# Patient Record
Sex: Female | Born: 1960 | Race: White | Hispanic: No | Marital: Married | Smoking: Current every day smoker
Health system: Southern US, Community
[De-identification: ages and names within clinical notes are randomized; demographics above are authoritative.]

## PROBLEM LIST (undated history)

## (undated) DIAGNOSIS — Z8739 Personal history of other diseases of the musculoskeletal system and connective tissue: Secondary | ICD-10-CM

## (undated) DIAGNOSIS — E0590 Thyrotoxicosis, unspecified without thyrotoxic crisis or storm: Secondary | ICD-10-CM

## (undated) DIAGNOSIS — I10 Essential (primary) hypertension: Secondary | ICD-10-CM

## (undated) DIAGNOSIS — M549 Dorsalgia, unspecified: Secondary | ICD-10-CM

## (undated) DIAGNOSIS — C541 Malignant neoplasm of endometrium: Secondary | ICD-10-CM

## (undated) DIAGNOSIS — G8929 Other chronic pain: Secondary | ICD-10-CM

## (undated) DIAGNOSIS — G43909 Migraine, unspecified, not intractable, without status migrainosus: Secondary | ICD-10-CM

## (undated) DIAGNOSIS — K529 Noninfective gastroenteritis and colitis, unspecified: Secondary | ICD-10-CM

## (undated) DIAGNOSIS — E7800 Pure hypercholesterolemia, unspecified: Secondary | ICD-10-CM

## (undated) DIAGNOSIS — F4310 Post-traumatic stress disorder, unspecified: Secondary | ICD-10-CM

## (undated) DIAGNOSIS — M4800 Spinal stenosis, site unspecified: Secondary | ICD-10-CM

## (undated) HISTORY — DX: Pure hypercholesterolemia, unspecified: E78.00

## (undated) HISTORY — DX: Other chronic pain: G89.29

## (undated) HISTORY — DX: Personal history of other diseases of the musculoskeletal system and connective tissue: Z87.39

## (undated) HISTORY — DX: Essential (primary) hypertension: I10

## (undated) HISTORY — PX: GANGLION CYST EXCISION: SHX1691

## (undated) HISTORY — DX: Noninfective gastroenteritis and colitis, unspecified: K52.9

## (undated) HISTORY — DX: Malignant neoplasm of endometrium: C54.1

## (undated) HISTORY — PX: TONSILLECTOMY: SUR1361

## (undated) HISTORY — DX: Dorsalgia, unspecified: M54.9

## (undated) HISTORY — DX: Thyrotoxicosis, unspecified without thyrotoxic crisis or storm: E05.90

## (undated) HISTORY — DX: Post-traumatic stress disorder, unspecified: F43.10

## (undated) HISTORY — PX: ABDOMINAL HYSTERECTOMY: SHX81

## (undated) HISTORY — DX: Migraine, unspecified, not intractable, without status migrainosus: G43.909

## (undated) HISTORY — PX: CHOLECYSTECTOMY: SHX55

## (undated) HISTORY — DX: Spinal stenosis, site unspecified: M48.00

---

## 1998-04-04 ENCOUNTER — Ambulatory Visit (HOSPITAL_COMMUNITY): Admission: RE | Admit: 1998-04-04 | Discharge: 1998-04-04 | Payer: Self-pay

## 2000-02-20 ENCOUNTER — Ambulatory Visit (HOSPITAL_BASED_OUTPATIENT_CLINIC_OR_DEPARTMENT_OTHER): Admission: RE | Admit: 2000-02-20 | Discharge: 2000-02-20 | Payer: Self-pay | Admitting: Orthopedic Surgery

## 2000-09-13 ENCOUNTER — Encounter: Payer: Self-pay | Admitting: Family Medicine

## 2000-09-13 ENCOUNTER — Encounter: Admission: RE | Admit: 2000-09-13 | Discharge: 2000-09-13 | Payer: Self-pay | Admitting: Family Medicine

## 2000-09-13 ENCOUNTER — Encounter (INDEPENDENT_AMBULATORY_CARE_PROVIDER_SITE_OTHER): Payer: Self-pay

## 2000-09-13 ENCOUNTER — Other Ambulatory Visit: Admission: RE | Admit: 2000-09-13 | Discharge: 2000-09-13 | Payer: Self-pay | Admitting: Radiology

## 2001-01-07 ENCOUNTER — Encounter: Payer: Self-pay | Admitting: Family Medicine

## 2001-01-07 ENCOUNTER — Ambulatory Visit (HOSPITAL_COMMUNITY): Admission: RE | Admit: 2001-01-07 | Discharge: 2001-01-07 | Payer: Self-pay | Admitting: Family Medicine

## 2002-09-23 ENCOUNTER — Ambulatory Visit (HOSPITAL_COMMUNITY): Admission: RE | Admit: 2002-09-23 | Discharge: 2002-09-23 | Payer: Self-pay | Admitting: Family Medicine

## 2002-09-23 ENCOUNTER — Encounter: Payer: Self-pay | Admitting: Family Medicine

## 2003-09-22 ENCOUNTER — Other Ambulatory Visit: Admission: RE | Admit: 2003-09-22 | Discharge: 2003-09-22 | Payer: Self-pay | Admitting: Family Medicine

## 2004-04-24 ENCOUNTER — Emergency Department (HOSPITAL_COMMUNITY): Admission: EM | Admit: 2004-04-24 | Discharge: 2004-04-24 | Payer: Self-pay | Admitting: *Deleted

## 2004-09-26 ENCOUNTER — Other Ambulatory Visit: Admission: RE | Admit: 2004-09-26 | Discharge: 2004-09-26 | Payer: Self-pay | Admitting: Family Medicine

## 2005-12-11 ENCOUNTER — Other Ambulatory Visit: Admission: RE | Admit: 2005-12-11 | Discharge: 2005-12-11 | Payer: Self-pay | Admitting: Family Medicine

## 2006-10-22 ENCOUNTER — Encounter: Admission: RE | Admit: 2006-10-22 | Discharge: 2006-10-22 | Payer: Self-pay | Admitting: Gastroenterology

## 2006-12-17 ENCOUNTER — Other Ambulatory Visit: Admission: RE | Admit: 2006-12-17 | Discharge: 2006-12-17 | Payer: Self-pay | Admitting: Family Medicine

## 2007-02-14 ENCOUNTER — Encounter (INDEPENDENT_AMBULATORY_CARE_PROVIDER_SITE_OTHER): Payer: Self-pay | Admitting: General Surgery

## 2007-02-14 ENCOUNTER — Ambulatory Visit (HOSPITAL_COMMUNITY): Admission: RE | Admit: 2007-02-14 | Discharge: 2007-02-15 | Payer: Self-pay | Admitting: General Surgery

## 2008-01-01 ENCOUNTER — Other Ambulatory Visit: Admission: RE | Admit: 2008-01-01 | Discharge: 2008-01-01 | Payer: Self-pay | Admitting: Family Medicine

## 2009-06-25 DIAGNOSIS — C541 Malignant neoplasm of endometrium: Secondary | ICD-10-CM

## 2009-06-25 HISTORY — DX: Malignant neoplasm of endometrium: C54.1

## 2009-08-19 HISTORY — PX: ROBOTIC ASSISTED LAP VAGINAL HYSTERECTOMY: SHX2362

## 2010-11-07 NOTE — Op Note (Signed)
NAMEJONTE, WOLLAM            ACCOUNT NO.:  [id number]   MEDICAL RECORD NO.:  [id number]          PATIENT TYPE:  AMB   LOCATION:  SDS                          FACILITY:  [facility]   PHYSICIAN:  Adolph Pollack, M.D.DATE OF BIRTH:  Oct 30, 1960   DATE OF PROCEDURE:  02/14/2007  DATE OF DISCHARGE:                               OPERATIVE REPORT   PREOP DIAGNOSIS:  Porcelain gallbladder.   POSTOPERATIVE DIAGNOSIS:  Porcelain gallbladder.   PROCEDURE:  Laparoscopic cholecystectomy with intraoperative  cholangiogram.   SURGEON:  Adolph Pollack, M.D.   ANESTHESIA:  General.   INDICATIONS:  Ms. Pokorney is a 50 year old female who has been having  some problems with upper abdominal pain; and during her workup she had  an ultrasound that demonstrated findings consistent with a calcified  gallbladder/porcelain gallbladder.  It is for this reason, that she  presents now for cholecystectomy.  Procedure and risks were explained to  her preoperatively. She also understands that the reason that we are  doing this is because of it being a porcelain gallbladder; and she still  may have some her symptoms.   TECHNIQUE:  She is brought to the operating room, placed supine on the  operating table, and a general anesthetic was administered.  Her  abdominal wall was sterilely prepped and draped.  Marcaine solution was  infiltrated in the supraumbilical region.  A transverse supraumbilical  incision was made through the skin and subcutaneous tissue.  Then the  fascia was identified, and an incision made in the midline fascia.  Peritoneal cavity was entered under direct vision.  A pursestring suture  of #0 Vicryl was placed around the fascial edges.  A Hassan trocar was  introduced into the peritoneal cavity and a pneumoperitoneum created by  insufflation of CO2 gas.   Next the laparoscope was introduced.  She was placed in the reverse  Trendelenburg position; and the right side tilted up.   An 11-mm trocar  was placed through an epigastric incision; and  two 5 mm trocars were  placed in the right upper quadrant.  The fundus of the gallbladder was  grasped and retracted toward the right shoulder.  Using blunt  dissection, the infundibulum was identified and grasped and then  mobilized.  The cystic duct was then identified and a window created  around it.  A clip was placed just above the cystic duct gallbladder  junction and a small incision made at the cystic duct gallbladder  junction.  A cholangiocath was passed through the anterior abdominal  wall and then placed into the cystic duct; and the cholangiogram was  performed.   Under real time fluoroscopy dilute contrast was injected into the cystic  duct which was of moderate length.  The common hepatic, right-and-left  hepatic, and common bile ducts all filled promptly, and contrast drained  promptly into the duodenum.  There was no evidence of obstruction and  the final report is pending radiologist interpretation.   The cholangiocatheter was removed, the cystic duct was clipped 3x  proximally and divided.  The cystic artery was identified and a window  created around  it.  It was clipped and divided.  The gallbladder then  dissected free from the liver using electrocautery and placed in an  Endopouch bag.   The gallbladder fossa was irrigated and bleeding points were controlled  with electrocautery.  The area was further inspected and no bleeding or  bile leak was noted.   The perihepatic irrigation fluid was evacuated.  The gallbladder was  removed through the supraumbilical port in the Endopouch bag; and then  the supraumbilical fascial defect was closed under laparoscopic vision  by tightening up and tying down the pursestring suture.  The remaining  trocars were removed; and a pneumoperitoneum was released.  The skin  incisions were closed with 4-0 Monocryl subcuticular stitches followed  by Steri-Strips and  sterile dressings.   She tolerated the procedure well without any apparent complications; and  was taken to the recovery room in satisfactory condition.      Adolph Pollack, M.D.  Electronically Signed     TJR/MEDQ  D:  02/14/2007  T:  02/15/2007  Job:  [id number]   cc:   Anselmo Rod, M.D.  Holley Bouche, M.D.

## 2010-11-10 NOTE — Op Note (Signed)
[place]  Patient:    Alejandra Taylor, Alejandra Taylor                     MRN: [id number] Proc. Date: 02/20/00 Adm. Date:  [id number] Attending:  Drema Pry                           Operative Report  PREOPERATIVE DIAGNOSIS:  Probable rupture of recently repaired extensor tendon to index at left wrist.  POSTOPERATIVE DIAGNOSIS:  Scarring of repaired laceration of extensor to index and extensor indicis proprius.  Scar down with extensor lag.  PROCEDURE:  Exploration left dorsal wrist with tenolysis of extensor indicis proprius and extensor communis at wrist.  SURGEON:  Jearld Adjutant, M.D.  ASSISTANT:  Vilinda Blanks. Moye, P.A.-C.  ANESTHESIA:  General endotracheal.  CULTURES:  None.  DRAINS:  None.  ESTIMATED BLOOD LOSS:  Minimal.  TOURNIQUET TIME:  45 minutes.  PATHOLOGIC FINDINGS/HISTORY: Alejandra Taylor is a 51 year old female who underwent removal of a ganglion cyst of the left wrist by Dr. Chaney Malling on 11/24/99. Following surgery, she was unable to fully extend her left index finger.  She was recommended to [place] by a family member who was a patient.  She had no pain associated with her condition but a costal lack of extension.  Dr. Chaney Malling had told he had partially cut the tendon and had repaired it.  She had been going to rehabilitation with no gains in extension secondary to rehabilitation.  We evaluated her, and she had extensor lag of the index finger of about 45 degrees, unable to extend the MP.  She was able to extend the DIP as well as partially extend the PIP.  Otherwise, the exam was normal. MRI scan was obtained and it showed that there was a destruction of the left index finger extensor tendon at the level of the proximal aspect of the capitate.  In any case what we found at surgery was intense scarring of both the communis and indicis proprius tendon in one mass down to the dorsal wrist capsule and into the retinaculum.  We released the  retinaculum of the fourth dorsal compartment and found that after trimming on the tendon a bit longitudinally we were able to get the scar off and that the indicis proprius was healed to the index extensor communis, and there was a gap between the proximal end of the indicis proprius but that was scarred down to the tendon and the gap was about 3 cm.  However, the whole thing had healed into one common tendon mass and at rest the resting tension was normal in wrist flexion.  The tension on the extensor tendon was equal to the others as well as wrist extension.  In addition, proximal to the level of the retinaculum when we used an Allis to tighten the tendon as it was after tenolysis it pulled the extensor tendon up appropriately into full extension.  Therefore we did not elect to do more than an extensive tenolysis and leave on the top and bottom of the tendon down to the capsule releasing it from that as well as leaving the extensor retinaculum and will start her on early motion.  Other tendons adjacent to it, 3, 4 and 5 were noted to be pulling through normally.  DESCRIPTION OF PROCEDURE:  With adequate anesthesia obtained using endotracheal technique, 1 g of Ancef was given IV prophylaxis.  The patient was placed  in the supine position.  The left upper extremity was prepped from the fingertips to the tourniquet in the standard fashion.  After standard prepping and draping, Esmarch exsanguination was used, and the tourniquet was let up to 300 mmHg.  I then made an extensor approach to the transverse incision, the radial limb distal and the ulnar limb proximal, developed flaps and placed corner stitches in the flaps to retract.  Dissection was carried down through the subcutaneous tissue in the matted scar around the extensor retinaculum.  Meticulous dissection was then carried out around the extensor tendon to four.  The dorsal compartment and the retinaculum and the extensor tendon to  four was released longitudinally as well as its attachments down to the scarred bed from whence the ganglion came and the area of the capitate. There was no disruption of the tendon.  I think the signal was coming from irregularity of the tendon as it was healing. We then tested it tension and pull through and elected not to excise any scar or repair the indicis in any other way than its position to the extensor communis tendon.  The tendon was fully free at the closure.  Irrigation was carried out, and the wound was closed in layers with 3-0 Vicryl in the subcu and running 4-0 Monocryl with Steri-Strips.  A pressure dressing was applied with a volar plaster in slight cock-up allowing full finger flexion and extension.  The patient then was awakened and taken to the recovery room in satisfactory condition for routine postoperative care, discharged and told to call the office for an appointment for physical therapy on the finger, occupational therapy on Thursday.  She was given Tylox for pain DD:  02/20/00 TD:  02/21/00 Job: [id number]

## 2011-04-06 LAB — DIFFERENTIAL
Basophils Absolute: 0
Basophils Absolute: 0
Basophils Relative: 0
Basophils Relative: 0
Eosinophils Absolute: 0
Eosinophils Absolute: 0.2
Eosinophils Relative: 0
Eosinophils Relative: 2
Lymphocytes Relative: 12
Lymphocytes Relative: 21
Lymphs Abs: 1.5
Lymphs Abs: 1.8
Monocytes Absolute: 0.6
Monocytes Absolute: 0.8 — ABNORMAL HIGH
Monocytes Relative: 7
Monocytes Relative: 7
Neutro Abs: 10.3 — ABNORMAL HIGH
Neutro Abs: 6.2
Neutrophils Relative %: 71
Neutrophils Relative %: 81 — ABNORMAL HIGH

## 2011-04-06 LAB — COMPREHENSIVE METABOLIC PANEL
ALT: 23
AST: 24
Albumin: 4.2
Alkaline Phosphatase: 46
BUN: 9
CO2: 29
Calcium: 9.7
Chloride: 99
Creatinine, Ser: 0.61
GFR calc non Af Amer: >60
Glucose, Bld: 144 — ABNORMAL HIGH
Potassium: 3.8
Sodium: 137
Total Bilirubin: 0.6
Total Protein: 7.5

## 2011-04-06 LAB — CBC
HCT: 35.3 — ABNORMAL LOW
HCT: 39.1
Hemoglobin: 12.3
Hemoglobin: 13.8
MCHC: 34.9
MCHC: 35.2
MCV: 91.5
MCV: 92.5
Platelets: 347
Platelets: 402 — ABNORMAL HIGH
RBC: 3.82 — ABNORMAL LOW
RBC: 4.27
RDW: 13
RDW: 13.4
WBC: 12.6 — ABNORMAL HIGH
WBC: 8.8

## 2011-04-06 LAB — HEPATIC FUNCTION PANEL
ALT: 52 — ABNORMAL HIGH
AST: 53 — ABNORMAL HIGH
Albumin: 3.4 — ABNORMAL LOW
Alkaline Phosphatase: 39
Bilirubin, Direct: <0.1
Total Bilirubin: 0.7
Total Protein: 6

## 2011-04-06 LAB — PROTIME-INR
INR: 0.9
Prothrombin Time: 12.4

## 2011-10-17 ENCOUNTER — Other Ambulatory Visit (HOSPITAL_COMMUNITY)
Admission: RE | Admit: 2011-10-17 | Discharge: 2011-10-17 | Disposition: A | Discharge status: Home / Self Care | Payer: BC Managed Care – PPO | Source: Ambulatory Visit | Attending: Obstetrics and Gynecology | Admitting: Obstetrics and Gynecology

## 2011-10-17 ENCOUNTER — Other Ambulatory Visit: Payer: Self-pay | Admitting: Obstetrics and Gynecology

## 2011-10-17 DIAGNOSIS — Z01419 Encounter for gynecological examination (general) (routine) without abnormal findings: Secondary | ICD-10-CM | POA: Insufficient documentation

## 2011-11-06 ENCOUNTER — Encounter: Payer: Self-pay | Admitting: Gynecologic Oncology

## 2011-11-07 ENCOUNTER — Ambulatory Visit: Payer: Self-pay | Admitting: Gynecologic Oncology

## 2011-12-03 ENCOUNTER — Ambulatory Visit: Payer: Self-pay | Admitting: Gynecology

## 2011-12-11 ENCOUNTER — Ambulatory Visit: Payer: BC Managed Care – PPO | Admitting: Gynecologic Oncology

## 2012-01-22 ENCOUNTER — Ambulatory Visit: Payer: BC Managed Care – PPO | Attending: Gynecologic Oncology | Admitting: Gynecologic Oncology

## 2012-01-22 ENCOUNTER — Encounter: Payer: Self-pay | Admitting: Gynecologic Oncology

## 2012-01-22 VITALS — BP 122/80 | HR 78 | Temp 98.7°F | Resp 16 | Ht 67.32 in | Wt 211.5 lb

## 2012-01-22 DIAGNOSIS — Z803 Family history of malignant neoplasm of breast: Secondary | ICD-10-CM | POA: Insufficient documentation

## 2012-01-22 DIAGNOSIS — Z79899 Other long term (current) drug therapy: Secondary | ICD-10-CM | POA: Insufficient documentation

## 2012-01-22 DIAGNOSIS — I10 Essential (primary) hypertension: Secondary | ICD-10-CM | POA: Insufficient documentation

## 2012-01-22 DIAGNOSIS — E7800 Pure hypercholesterolemia, unspecified: Secondary | ICD-10-CM | POA: Insufficient documentation

## 2012-01-22 DIAGNOSIS — F17200 Nicotine dependence, unspecified, uncomplicated: Secondary | ICD-10-CM | POA: Insufficient documentation

## 2012-01-22 DIAGNOSIS — C541 Malignant neoplasm of endometrium: Secondary | ICD-10-CM | POA: Insufficient documentation

## 2012-01-22 DIAGNOSIS — E119 Type 2 diabetes mellitus without complications: Secondary | ICD-10-CM | POA: Insufficient documentation

## 2012-01-22 DIAGNOSIS — C549 Malignant neoplasm of corpus uteri, unspecified: Secondary | ICD-10-CM | POA: Insufficient documentation

## 2012-01-22 DIAGNOSIS — Z90710 Acquired absence of both cervix and uterus: Secondary | ICD-10-CM | POA: Insufficient documentation

## 2012-01-22 DIAGNOSIS — Z8041 Family history of malignant neoplasm of ovary: Secondary | ICD-10-CM | POA: Insufficient documentation
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
## 2012-01-22 NOTE — Patient Instructions (Addendum)
Advised to stop smoking. Genetic counseling referral. F/U in 1 year.  Thank you very much Alejandra Taylor for allowing me to provide care for you today.  I appreciate your confidence in choosing our Gynecologic Oncology team.  If you have any questions about your visit today please call our office and we will get back to you as soon as possible.  [name]. Tyller Bowlby MD., PhD Gynecologic Oncology

## 2012-03-17 ENCOUNTER — Other Ambulatory Visit: Payer: BC Managed Care – PPO | Admitting: Lab

## 2012-03-17 ENCOUNTER — Ambulatory Visit: Payer: BC Managed Care – PPO | Admitting: Genetic Counselor

## 2012-03-17 NOTE — Progress Notes (Signed)
Patient did not show for her appointment.

## 2013-04-27 DIAGNOSIS — F4001 Agoraphobia with panic disorder: Secondary | ICD-10-CM | POA: Diagnosis not present

## 2013-04-27 DIAGNOSIS — F4310 Post-traumatic stress disorder, unspecified: Secondary | ICD-10-CM | POA: Diagnosis not present

## 2013-04-27 DIAGNOSIS — F330 Major depressive disorder, recurrent, mild: Secondary | ICD-10-CM | POA: Diagnosis not present

## 2013-05-06 DIAGNOSIS — M25819 Other specified joint disorders, unspecified shoulder: Secondary | ICD-10-CM | POA: Diagnosis not present

## 2013-05-20 DIAGNOSIS — I10 Essential (primary) hypertension: Secondary | ICD-10-CM | POA: Diagnosis not present

## 2013-05-20 DIAGNOSIS — F4310 Post-traumatic stress disorder, unspecified: Secondary | ICD-10-CM | POA: Diagnosis not present

## 2013-05-20 DIAGNOSIS — E119 Type 2 diabetes mellitus without complications: Secondary | ICD-10-CM | POA: Diagnosis not present

## 2013-05-20 DIAGNOSIS — Z23 Encounter for immunization: Secondary | ICD-10-CM | POA: Diagnosis not present

## 2013-05-20 DIAGNOSIS — M545 Low back pain, unspecified: Secondary | ICD-10-CM | POA: Diagnosis not present

## 2013-05-20 DIAGNOSIS — E039 Hypothyroidism, unspecified: Secondary | ICD-10-CM | POA: Diagnosis not present

## 2013-05-20 DIAGNOSIS — E785 Hyperlipidemia, unspecified: Secondary | ICD-10-CM | POA: Diagnosis not present

## 2013-09-11 DIAGNOSIS — E119 Type 2 diabetes mellitus without complications: Secondary | ICD-10-CM | POA: Diagnosis not present

## 2013-10-14 ENCOUNTER — Telehealth: Payer: Self-pay | Admitting: *Deleted

## 2013-10-14 NOTE — Telephone Encounter (Signed)
Called pt to remind her of future appt on 10/15/13, pt stated "I would like to cancel this appointment my  husband is having surgery tomorrow". Pt refused to reschedule at this time. Pt stated "I will call back to reschedule at a later time"

## 2013-10-15 ENCOUNTER — Ambulatory Visit: Payer: BC Managed Care – PPO | Admitting: Gynecologic Oncology

## 2013-10-26 DIAGNOSIS — F4001 Agoraphobia with panic disorder: Secondary | ICD-10-CM | POA: Diagnosis not present

## 2013-10-26 DIAGNOSIS — F3342 Major depressive disorder, recurrent, in full remission: Secondary | ICD-10-CM | POA: Diagnosis not present

## 2014-04-28 DIAGNOSIS — F3342 Major depressive disorder, recurrent, in full remission: Secondary | ICD-10-CM | POA: Diagnosis not present

## 2014-04-28 DIAGNOSIS — F4310 Post-traumatic stress disorder, unspecified: Secondary | ICD-10-CM | POA: Diagnosis not present

## 2014-04-28 DIAGNOSIS — F4001 Agoraphobia with panic disorder: Secondary | ICD-10-CM | POA: Diagnosis not present

## 2014-11-01 DIAGNOSIS — J40 Bronchitis, not specified as acute or chronic: Secondary | ICD-10-CM | POA: Diagnosis not present

## 2014-11-01 DIAGNOSIS — L820 Inflamed seborrheic keratosis: Secondary | ICD-10-CM | POA: Diagnosis not present

## 2014-11-01 DIAGNOSIS — M545 Low back pain: Secondary | ICD-10-CM | POA: Diagnosis not present

## 2014-11-01 DIAGNOSIS — E1165 Type 2 diabetes mellitus with hyperglycemia: Secondary | ICD-10-CM | POA: Diagnosis not present

## 2014-11-01 DIAGNOSIS — E039 Hypothyroidism, unspecified: Secondary | ICD-10-CM | POA: Diagnosis not present

## 2014-11-01 DIAGNOSIS — G441 Vascular headache, not elsewhere classified: Secondary | ICD-10-CM | POA: Diagnosis not present

## 2014-11-01 DIAGNOSIS — E785 Hyperlipidemia, unspecified: Secondary | ICD-10-CM | POA: Diagnosis not present

## 2014-11-01 DIAGNOSIS — Z23 Encounter for immunization: Secondary | ICD-10-CM | POA: Diagnosis not present

## 2014-11-01 DIAGNOSIS — M1990 Unspecified osteoarthritis, unspecified site: Secondary | ICD-10-CM | POA: Diagnosis not present

## 2015-03-02 ENCOUNTER — Telehealth: Payer: Self-pay | Admitting: Nurse Practitioner

## 2015-03-02 NOTE — Telephone Encounter (Signed)
Patient called clinic to reschedule apt 03/03/15. She is in [place] for sick family member and unsure when she will return. Rn gave her next available with Dr. Skeet Latch, 04/07/15 at 2:45, but informed her that if she returns sooner and wants to be seen by a colleague to call clinic. Also informed her to call clinic with any new needs or concerns. She verbalizes understanding.

## 2015-03-03 ENCOUNTER — Ambulatory Visit: Payer: Self-pay | Admitting: Gynecologic Oncology

## 2015-04-07 ENCOUNTER — Other Ambulatory Visit (HOSPITAL_COMMUNITY)
Admission: RE | Admit: 2015-04-07 | Discharge: 2015-04-07 | Disposition: A | Discharge status: Home / Self Care | Payer: BLUE CROSS/BLUE SHIELD | Source: Ambulatory Visit | Attending: Gynecologic Oncology | Admitting: Gynecologic Oncology

## 2015-04-07 ENCOUNTER — Encounter: Payer: Self-pay | Admitting: Gynecologic Oncology

## 2015-04-07 ENCOUNTER — Ambulatory Visit: Payer: BLUE CROSS/BLUE SHIELD | Attending: Gynecologic Oncology | Admitting: Gynecologic Oncology

## 2015-04-07 VITALS — BP 169/76 | HR 84 | Temp 98.2°F | Resp 18 | Ht 67.32 in | Wt 207.3 lb

## 2015-04-07 DIAGNOSIS — N9489 Other specified conditions associated with female genital organs and menstrual cycle: Secondary | ICD-10-CM | POA: Diagnosis not present

## 2015-04-07 DIAGNOSIS — Z808 Family history of malignant neoplasm of other organs or systems: Secondary | ICD-10-CM | POA: Diagnosis not present

## 2015-04-07 DIAGNOSIS — Z720 Tobacco use: Secondary | ICD-10-CM | POA: Diagnosis not present

## 2015-04-07 DIAGNOSIS — C541 Malignant neoplasm of endometrium: Secondary | ICD-10-CM | POA: Insufficient documentation

## 2015-04-07 DIAGNOSIS — Z01411 Encounter for gynecological examination (general) (routine) with abnormal findings: Secondary | ICD-10-CM | POA: Insufficient documentation

## 2015-04-07 DIAGNOSIS — N898 Other specified noninflammatory disorders of vagina: Secondary | ICD-10-CM

## 2015-04-07 NOTE — Patient Instructions (Signed)
We will call you with the results of your pap smear and biopsy taken today. Follow-up with Dr. Skeet Latch in 1 year (October 2017). Please call our office in the summer 2017 to schedule this appointment or sooner with any concerns.
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
## 2015-04-11 ENCOUNTER — Telehealth: Payer: Self-pay | Admitting: Gynecologic Oncology

## 2015-04-11 NOTE — Telephone Encounter (Signed)
Patient informed of biopsy results.  No concerns voiced.  Advised to call for any questions or concerns. 

## 2015-04-12 ENCOUNTER — Other Ambulatory Visit: Payer: Self-pay | Admitting: *Deleted

## 2015-04-12 DIAGNOSIS — C541 Malignant neoplasm of endometrium: Secondary | ICD-10-CM

## 2015-04-12 LAB — CYTOLOGY - PAP

## 2015-04-13 ENCOUNTER — Telehealth: Payer: Self-pay

## 2015-04-13 NOTE — Telephone Encounter (Signed)
Orders received from [place] to contact the patient and  update with result from 04/07/15 with Dr Skeet Latch  .Patient contacted with results from PAP on 04/07/15 being "normal" . Patient states understanding , denies further concerns at this time , will call with any changes.

## 2015-04-14 ENCOUNTER — Telehealth: Payer: Self-pay

## 2015-04-14 NOTE — Telephone Encounter (Signed)
Follow up call placed to Genetic Counseling to see if the patient was scheduled for genetic counseling as requested by Dr Janie Morning. No answer , left a detailed message with request for genetic counseling.  Call back information given if additional questions arise.

## 2015-04-19 ENCOUNTER — Telehealth: Payer: Self-pay | Admitting: Genetic Counselor

## 2015-04-19 NOTE — Telephone Encounter (Signed)
Genetic appt-s/w patient and gave genetic appt for 11/15 @ 2 w/Kayla Boggs

## 2015-05-10 ENCOUNTER — Other Ambulatory Visit: Payer: Medicare Other

## 2015-05-10 ENCOUNTER — Encounter: Payer: Medicare Other | Admitting: Genetic Counselor

## 2015-07-04 LAB — CBC AND DIFFERENTIAL
HCT: 41 (ref 36–46)
Hemoglobin: 14.3 (ref 12.0–16.0)
Platelets: 383 (ref 150–399)
WBC: 8.3

## 2015-07-04 LAB — TSH: TSH: 2.11 (ref <=5.90)

## 2015-07-04 LAB — BASIC METABOLIC PANEL
BUN: 7 (ref 4–21)
Creatinine: 0.7 (ref <=1.1)
Glucose: 173
Potassium: 3.4 (ref 3.4–5.3)
Sodium: 137 (ref 137–147)

## 2015-07-04 LAB — LIPID PANEL
Cholesterol: 216 — AB (ref 0–200)
HDL: 39 (ref 35–70)
LDL Cholesterol: 125
Triglycerides: 263 — AB (ref 40–160)

## 2015-07-04 LAB — HEPATIC FUNCTION PANEL
ALT: 212 — AB (ref 7–35)
AST: 117 — AB (ref 13–35)
Alkaline Phosphatase: 110 (ref 25–125)
Bilirubin, Total: 0.7

## 2015-07-14 DIAGNOSIS — L309 Dermatitis, unspecified: Secondary | ICD-10-CM | POA: Diagnosis not present

## 2015-07-14 DIAGNOSIS — E1165 Type 2 diabetes mellitus with hyperglycemia: Secondary | ICD-10-CM | POA: Diagnosis not present

## 2015-07-14 DIAGNOSIS — E785 Hyperlipidemia, unspecified: Secondary | ICD-10-CM | POA: Diagnosis not present

## 2015-07-14 DIAGNOSIS — F4310 Post-traumatic stress disorder, unspecified: Secondary | ICD-10-CM | POA: Diagnosis not present

## 2015-07-14 DIAGNOSIS — E039 Hypothyroidism, unspecified: Secondary | ICD-10-CM | POA: Diagnosis not present

## 2015-07-14 DIAGNOSIS — I10 Essential (primary) hypertension: Secondary | ICD-10-CM | POA: Diagnosis not present

## 2015-07-14 DIAGNOSIS — Z23 Encounter for immunization: Secondary | ICD-10-CM | POA: Diagnosis not present

## 2015-07-14 DIAGNOSIS — N3000 Acute cystitis without hematuria: Secondary | ICD-10-CM | POA: Diagnosis not present

## 2015-07-14 DIAGNOSIS — M545 Low back pain: Secondary | ICD-10-CM | POA: Diagnosis not present

## 2015-07-28 DIAGNOSIS — I10 Essential (primary) hypertension: Secondary | ICD-10-CM | POA: Diagnosis not present

## 2015-08-30 DIAGNOSIS — F3342 Major depressive disorder, recurrent, in full remission: Secondary | ICD-10-CM | POA: Diagnosis not present

## 2015-08-30 DIAGNOSIS — F4310 Post-traumatic stress disorder, unspecified: Secondary | ICD-10-CM | POA: Diagnosis not present

## 2015-08-30 DIAGNOSIS — F4001 Agoraphobia with panic disorder: Secondary | ICD-10-CM | POA: Diagnosis not present

## 2015-11-25 DIAGNOSIS — E119 Type 2 diabetes mellitus without complications: Secondary | ICD-10-CM | POA: Diagnosis not present

## 2016-04-20 LAB — HEMOGLOBIN A1C: Hemoglobin A1C: 5.2

## 2016-04-23 LAB — HM MAMMOGRAPHY

## 2016-05-30 DIAGNOSIS — M545 Low back pain: Secondary | ICD-10-CM | POA: Diagnosis not present

## 2016-05-30 DIAGNOSIS — M5416 Radiculopathy, lumbar region: Secondary | ICD-10-CM | POA: Diagnosis not present

## 2016-05-30 DIAGNOSIS — M79672 Pain in left foot: Secondary | ICD-10-CM | POA: Diagnosis not present

## 2016-07-18 DIAGNOSIS — M1711 Unilateral primary osteoarthritis, right knee: Secondary | ICD-10-CM | POA: Diagnosis not present

## 2016-07-18 DIAGNOSIS — M1712 Unilateral primary osteoarthritis, left knee: Secondary | ICD-10-CM | POA: Diagnosis not present

## 2016-11-21 DIAGNOSIS — F4001 Agoraphobia with panic disorder: Secondary | ICD-10-CM | POA: Diagnosis not present

## 2016-11-21 DIAGNOSIS — F3342 Major depressive disorder, recurrent, in full remission: Secondary | ICD-10-CM | POA: Diagnosis not present

## 2016-11-21 DIAGNOSIS — F4310 Post-traumatic stress disorder, unspecified: Secondary | ICD-10-CM | POA: Diagnosis not present

## 2017-01-28 LAB — LIPID PANEL
Cholesterol: 174 (ref 0–200)
HDL: 80 — AB (ref 35–70)
LDL Cholesterol: 71
Triglycerides: 115 (ref 40–160)

## 2017-01-28 LAB — CBC AND DIFFERENTIAL
HCT: 39 (ref 36–46)
Hemoglobin: 13.9 (ref 12.0–16.0)
Platelets: 261 (ref 150–399)
WBC: 9.9

## 2017-01-28 LAB — BASIC METABOLIC PANEL
BUN: 10 (ref 4–21)
Creatinine: 1 (ref <=1.1)
Glucose: 110
Potassium: 4.4 (ref 3.4–5.3)
Sodium: 119 — AB (ref 137–147)

## 2017-01-28 LAB — TSH: TSH: 0.73 (ref <=5.90)

## 2017-01-28 LAB — HEPATIC FUNCTION PANEL
ALT: 8 (ref 7–35)
AST: 15 (ref 13–35)
Alkaline Phosphatase: 70 (ref 25–125)
Bilirubin, Total: 0.3

## 2017-01-30 ENCOUNTER — Ambulatory Visit
Admission: RE | Admit: 2017-01-30 | Discharge: 2017-01-30 | Disposition: A | Discharge status: Home / Self Care | Payer: BLUE CROSS/BLUE SHIELD | Source: Ambulatory Visit | Attending: Family Medicine | Admitting: Family Medicine

## 2017-01-30 ENCOUNTER — Other Ambulatory Visit: Payer: Self-pay | Admitting: Family Medicine

## 2017-01-30 DIAGNOSIS — R059 Cough, unspecified: Secondary | ICD-10-CM

## 2017-01-30 DIAGNOSIS — R05 Cough: Secondary | ICD-10-CM

## 2017-03-20 ENCOUNTER — Ambulatory Visit: Payer: BLUE CROSS/BLUE SHIELD | Admitting: Family Medicine

## 2017-03-21 ENCOUNTER — Ambulatory Visit: Payer: BLUE CROSS/BLUE SHIELD | Admitting: Family Medicine

## 2017-04-17 ENCOUNTER — Encounter: Payer: Self-pay | Admitting: Adult Health

## 2017-04-17 ENCOUNTER — Ambulatory Visit (INDEPENDENT_AMBULATORY_CARE_PROVIDER_SITE_OTHER): Payer: BLUE CROSS/BLUE SHIELD | Admitting: Adult Health

## 2017-04-17 VITALS — BP 131/84 | HR 106 | Ht 67.25 in | Wt 194.8 lb

## 2017-04-17 DIAGNOSIS — E785 Hyperlipidemia, unspecified: Secondary | ICD-10-CM | POA: Diagnosis not present

## 2017-04-17 DIAGNOSIS — F4310 Post-traumatic stress disorder, unspecified: Secondary | ICD-10-CM | POA: Diagnosis not present

## 2017-04-17 DIAGNOSIS — Z23 Encounter for immunization: Secondary | ICD-10-CM

## 2017-04-17 DIAGNOSIS — G8929 Other chronic pain: Secondary | ICD-10-CM | POA: Insufficient documentation

## 2017-04-17 DIAGNOSIS — I10 Essential (primary) hypertension: Secondary | ICD-10-CM

## 2017-04-17 DIAGNOSIS — I152 Hypertension secondary to endocrine disorders: Secondary | ICD-10-CM | POA: Insufficient documentation

## 2017-04-17 DIAGNOSIS — G43901 Migraine, unspecified, not intractable, with status migrainosus: Secondary | ICD-10-CM | POA: Insufficient documentation

## 2017-04-17 DIAGNOSIS — E039 Hypothyroidism, unspecified: Secondary | ICD-10-CM

## 2017-04-17 DIAGNOSIS — E1169 Type 2 diabetes mellitus with other specified complication: Secondary | ICD-10-CM | POA: Insufficient documentation

## 2017-04-17 MED ORDER — METFORMIN HCL 500 MG PO TABS: 500.0000 mg | ORAL_TABLET | Freq: Two times a day (BID) | ORAL | 0 refills | Status: DC

## 2017-04-17 MED ORDER — LEVOTHYROXINE SODIUM 125 MCG PO TABS: 125.0000 ug | ORAL_TABLET | Freq: Every day | ORAL | 0 refills | Status: DC

## 2017-04-17 MED ORDER — SIMVASTATIN 40 MG PO TABS: 40.0000 mg | ORAL_TABLET | Freq: Every evening | ORAL | 0 refills | Status: DC

## 2017-04-17 MED ORDER — TOPIRAMATE 100 MG PO TABS: 200.0000 mg | ORAL_TABLET | Freq: Every day | ORAL | 0 refills | Status: DC

## 2017-04-17 MED ORDER — EZETIMIBE 10 MG PO TABS: 10.0000 mg | ORAL_TABLET | Freq: Every day | ORAL | 0 refills | Status: DC

## 2017-04-17 MED ORDER — LISINOPRIL-HYDROCHLOROTHIAZIDE 20-12.5 MG PO TABS: 1.0000 | ORAL_TABLET | Freq: Two times a day (BID) | ORAL | 0 refills | Status: DC

## 2017-04-17 MED ORDER — PANTOPRAZOLE SODIUM 40 MG PO TBEC: 40.0000 mg | DELAYED_RELEASE_TABLET | Freq: Every day | ORAL | 0 refills | Status: DC

## 2017-04-17 NOTE — Assessment & Plan Note (Signed)
Topiramate 100mg  - 2 tabs QHS-refill sent in. Neurology referral placed.

## 2017-04-17 NOTE — Patient Instructions (Signed)
Heart-Healthy Eating Plan Many factors influence your heart health, including eating and exercise habits. Heart (coronary) risk increases with abnormal blood fat (lipid) levels. Heart-healthy meal planning includes limiting unhealthy fats, increasing healthy fats, and making other small dietary changes. This includes maintaining a healthy body weight to help keep lipid levels within a normal range. What is my plan? Your health care provider recommends that you:  Get no more than __25__% of the total calories in your daily diet from fat.  Limit your intake of saturated fat to less than __5___% of your total calories each day.  Limit the amount of cholesterol in your diet to less than __300__ mg per day.  What types of fat should I choose?  Choose healthy fats more often. Choose monounsaturated and polyunsaturated fats, such as olive oil and canola oil, flaxseeds, walnuts, almonds, and seeds.  Eat more omega-3 fats. Good choices include salmon, mackerel, sardines, tuna, flaxseed oil, and ground flaxseeds. Aim to eat fish at least two times each week.  Limit saturated fats. Saturated fats are primarily found in animal products, such as meats, butter, and cream. Plant sources of saturated fats include palm oil, palm kernel oil, and coconut oil.  Avoid foods with partially hydrogenated oils in them. These contain trans fats. Examples of foods that contain trans fats are stick margarine, some tub margarines, cookies, crackers, and other baked goods. What general guidelines do I need to follow?  Check food labels carefully to identify foods with trans fats or high amounts of saturated fat.  Fill one half of your plate with vegetables and green salads. Eat 4-5 servings of vegetables per day. A serving of vegetables equals 1 cup of raw leafy vegetables,  cup of raw or cooked cut-up vegetables, or  cup of vegetable juice.  Fill one fourth of your plate with whole grains. Look for the word "whole"  as the first word in the ingredient list.  Fill one fourth of your plate with lean protein foods.  Eat 4-5 servings of fruit per day. A serving of fruit equals one medium whole fruit,  cup of dried fruit,  cup of fresh, frozen, or canned fruit, or  cup of 100% fruit juice.  Eat more foods that contain soluble fiber. Examples of foods that contain this type of fiber are apples, broccoli, carrots, beans, peas, and barley. Aim to get 20-30 g of fiber per day.  Eat more home-cooked food and less restaurant, buffet, and fast food.  Limit or avoid alcohol.  Limit foods that are high in starch and sugar.  Avoid fried foods.  Cook foods by using methods other than frying. Baking, boiling, grilling, and broiling are all great options. Other fat-reducing suggestions include: ? Removing the skin from poultry. ? Removing all visible fats from meats. ? Skimming the fat off of stews, soups, and gravies before serving them. ? Steaming vegetables in water or broth.  Lose weight if you are overweight. Losing just 5-10% of your initial body weight can help your overall health and prevent diseases such as diabetes and heart disease.  Increase your consumption of nuts, legumes, and seeds to 4-5 servings per week. One serving of dried beans or legumes equals  cup after being cooked, one serving of nuts equals 1 ounces, and one serving of seeds equals  ounce or 1 tablespoon.  You may need to monitor your salt (sodium) intake, especially if you have high blood pressure. Talk with your health care provider or dietitian to get  more information about reducing sodium. What foods can I eat? Grains  Breads, including [place], white, pita, wheat, raisin, rye, oatmeal, and [place]. Tortillas that are neither fried nor made with lard or trans fat. Low-fat rolls, including hotdog and hamburger buns and English muffins. Biscuits. Muffins. Waffles. Pancakes. Light popcorn. Whole-grain cereals. Flatbread. Melba  toast. Pretzels. Breadsticks. Rusks. Low-fat snacks and crackers, including oyster, saltine, matzo, graham, animal, and rye. Rice and pasta, including brown rice and those that are made with whole wheat. Vegetables All vegetables. Fruits All fruits, but limit coconut. Meats and Other Protein Sources Lean, well-trimmed beef, veal, pork, and lamb. Chicken and [place] without skin. All fish and shellfish. Wild duck, rabbit, pheasant, and venison. Egg whites or low-cholesterol egg substitutes. Dried beans, peas, lentils, and tofu.Seeds and most nuts. Dairy Low-fat or nonfat cheeses, including ricotta, string, and mozzarella. Skim or 1% milk that is liquid, powdered, or evaporated. Buttermilk that is made with low-fat milk. Nonfat or low-fat yogurt. Beverages Mineral water. Diet carbonated beverages. Sweets and Desserts Sherbets and fruit ices. Honey, jam, marmalade, jelly, and syrups. Meringues and gelatins. Pure sugar candy, such as hard candy, jelly beans, gumdrops, mints, marshmallows, and small amounts of dark chocolate. [employer] Eat all sweets and desserts in moderation. Fats and Oils Nonhydrogenated (trans-free) margarines. Vegetable oils, including soybean, sesame, sunflower, olive, peanut, safflower, corn, canola, and cottonseed. Salad dressings or mayonnaise that are made with a vegetable oil. Limit added fats and oils that you use for cooking, baking, salads, and as spreads. Other Cocoa powder. Coffee and tea. All seasonings and condiments. The items listed above may not be a complete list of recommended foods or beverages. Contact your dietitian for more options. What foods are not recommended? Grains Breads that are made with saturated or trans fats, oils, or whole milk. Croissants. Butter rolls. Cheese breads. Sweet rolls. Donuts. Buttered popcorn. Chow mein noodles. High-fat crackers, such as cheese or butter crackers. Meats and Other Protein Sources Fatty meats, such as  hotdogs, short ribs, sausage, spareribs, bacon, ribeye roast or steak, and mutton. High-fat deli meats, such as salami and bologna. Caviar. Domestic duck and goose. Organ meats, such as kidney, liver, sweetbreads, brains, gizzard, chitterlings, and heart. Dairy Cream, sour cream, cream cheese, and creamed cottage cheese. Whole milk cheeses, including blue (bleu), Monterey Jack, [place], [place], American, [place], Swiss, [place], [place], and [place]. Whole or 2% milk that is liquid, evaporated, or condensed. Whole buttermilk. Cream sauce or high-fat cheese sauce. Yogurt that is made from whole milk. Beverages Regular sodas and drinks with added sugar. Sweets and Desserts Frosting. Pudding. Cookies. Cakes other than angel food cake. Candy that has milk chocolate or white chocolate, hydrogenated fat, butter, coconut, or unknown ingredients. Buttered syrups. Full-fat ice cream or ice cream drinks. Fats and Oils Gravy that has suet, meat fat, or shortening. Cocoa butter, hydrogenated oils, palm oil, coconut oil, palm kernel oil. These can often be found in baked products, candy, fried foods, nondairy creamers, and whipped toppings. Solid fats and shortenings, including bacon fat, salt pork, lard, and butter. Nondairy cream substitutes, such as coffee creamers and sour cream substitutes. Salad dressings that are made of unknown oils, cheese, or sour cream. The items listed above may not be a complete list of foods and beverages to avoid. Contact your dietitian for more information. This information is not intended to replace advice given to you by your health care provider. Make sure you discuss any questions you have with your health care  provider. Document Released: 03/20/2008 Document Revised: 12/30/2015 Document Reviewed: 12/03/2013 Elsevier Interactive Patient Education  2017 [employer].   Hypertension Hypertension, commonly called high blood pressure, is when the force of blood pumping through the  arteries is too strong. The arteries are the blood vessels that carry blood from the heart throughout the body. Hypertension forces the heart to work harder to pump blood and may cause arteries to become narrow or stiff. Having untreated or uncontrolled hypertension can cause heart attacks, strokes, kidney disease, and other problems. A blood pressure reading consists of a higher number over a lower number. Ideally, your blood pressure should be below 120/80. The first ("top") number is called the systolic pressure. It is a measure of the pressure in your arteries as your heart beats. The second ("bottom") number is called the diastolic pressure. It is a measure of the pressure in your arteries as the heart relaxes. What are the causes? The cause of this condition is not known. What increases the risk? Some risk factors for high blood pressure are under your control. Others are not. Factors you can change  Smoking.  Having type 2 diabetes mellitus, high cholesterol, or both.  Not getting enough exercise or physical activity.  Being overweight.  Having too much fat, sugar, calories, or salt (sodium) in your diet.  Drinking too much alcohol. Factors that are difficult or impossible to change  Having chronic kidney disease.  Having a family history of high blood pressure.  Age. Risk increases with age.  Race. You may be at higher risk if you are African-American.  Gender. Men are at higher risk than women before age 63. After age 80, women are at higher risk than men.  Having obstructive sleep apnea.  Stress. What are the signs or symptoms? Extremely high blood pressure (hypertensive crisis) may cause:  Headache.  Anxiety.  Shortness of breath.  Nosebleed.  Nausea and vomiting.  Severe chest pain.  Jerky movements you cannot control (seizures).  How is this diagnosed? This condition is diagnosed by measuring your blood pressure while you are seated, with your arm  resting on a surface. The cuff of the blood pressure monitor will be placed directly against the skin of your upper arm at the level of your heart. It should be measured at least twice using the same arm. Certain conditions can cause a difference in blood pressure between your right and left arms. Certain factors can cause blood pressure readings to be lower or higher than normal (elevated) for a short period of time:  When your blood pressure is higher when you are in a health care provider's office than when you are at home, this is called white coat hypertension. Most people with this condition do not need medicines.  When your blood pressure is higher at home than when you are in a health care provider's office, this is called masked hypertension. Most people with this condition may need medicines to control blood pressure.  If you have a high blood pressure reading during one visit or you have normal blood pressure with other risk factors:  You may be asked to return on a different day to have your blood pressure checked again.  You may be asked to monitor your blood pressure at home for 1 week or longer.  If you are diagnosed with hypertension, you may have other blood or imaging tests to help your health care provider understand your overall risk for other conditions. How is this treated? This  condition is treated by making healthy lifestyle changes, such as eating healthy foods, exercising more, and reducing your alcohol intake. Your health care provider may prescribe medicine if lifestyle changes are not enough to get your blood pressure under control, and if:  Your systolic blood pressure is above 130.  Your diastolic blood pressure is above 80.  Your personal target blood pressure may vary depending on your medical conditions, your age, and other factors. Follow these instructions at home: Eating and drinking  Eat a diet that is high in fiber and potassium, and low in sodium,  added sugar, and fat. An example eating plan is called the DASH (Dietary Approaches to Stop Hypertension) diet. To eat this way: ? Eat plenty of fresh fruits and vegetables. Try to fill half of your plate at each meal with fruits and vegetables. ? Eat whole grains, such as whole wheat pasta, brown rice, or whole grain bread. Fill about one quarter of your plate with whole grains. ? Eat or drink low-fat dairy products, such as skim milk or low-fat yogurt. ? Avoid fatty cuts of meat, processed or cured meats, and poultry with skin. Fill about one quarter of your plate with lean proteins, such as fish, chicken without skin, beans, eggs, and tofu. ? Avoid premade and processed foods. These tend to be higher in sodium, added sugar, and fat.  Reduce your daily sodium intake. Most people with hypertension should eat less than 1,500 mg of sodium a day.  Limit alcohol intake to no more than 1 drink a day for nonpregnant women and 2 drinks a day for men. One drink equals 12 oz of beer, 5 oz of wine, or 1 oz of hard liquor. Lifestyle  Work with your health care provider to maintain a healthy body weight or to lose weight. Ask what an ideal weight is for you.  Get at least 30 minutes of exercise that causes your heart to beat faster (aerobic exercise) most days of the week. Activities may include walking, swimming, or biking.  Include exercise to strengthen your muscles (resistance exercise), such as pilates or lifting weights, as part of your weekly exercise routine. Try to do these types of exercises for 30 minutes at least 3 days a week.  Do not use any products that contain nicotine or tobacco, such as cigarettes and e-cigarettes. If you need help quitting, ask your health care provider.  Monitor your blood pressure at home as told by your health care provider.  Keep all follow-up visits as told by your health care provider. This is important. Medicines  Take over-the-counter and prescription  medicines only as told by your health care provider. Follow directions carefully. Blood pressure medicines must be taken as prescribed.  Do not skip doses of blood pressure medicine. Doing this puts you at risk for problems and can make the medicine less effective.  Ask your health care provider about side effects or reactions to medicines that you should watch for. Contact a health care provider if:  You think you are having a reaction to a medicine you are taking.  You have headaches that keep coming back (recurring).  You feel dizzy.  You have swelling in your ankles.  You have trouble with your vision. Get help right away if:  You develop a severe headache or confusion.  You have unusual weakness or numbness.  You feel faint.  You have severe pain in your chest or abdomen.  You vomit repeatedly.  You have trouble breathing.  Summary  Hypertension is when the force of blood pumping through your arteries is too strong. If this condition is not controlled, it may put you at risk for serious complications.  Your personal target blood pressure may vary depending on your medical conditions, your age, and other factors. For most people, a normal blood pressure is less than 120/80.  Hypertension is treated with lifestyle changes, medicines, or a combination of both. Lifestyle changes include weight loss, eating a healthy, low-sodium diet, exercising more, and limiting alcohol. This information is not intended to replace advice given to you by your health care provider. Make sure you discuss any questions you have with your health care provider. Document Released: 06/11/2005 Document Revised: 05/09/2016 Document Reviewed: 05/09/2016 Elsevier Interactive Patient Education  2018 [employer].  Increase water intake, strive for at least 100 ounces/day.   Follow Heart Healthy diet Increase regular exercise.  Recommend at least 30 minutes daily, 5 days per week of walking, jogging,  biking, swimming, YouTube/Pinterest workout videos. Please continue all medications as directed. Please continue with Orthopedic and Psychiatrist as directed. Please schedule a follow-up in 3 months, sooner if needed. WELCOME TO THE PRACTICE!

## 2017-04-17 NOTE — Assessment & Plan Note (Signed)
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  She has been using OTC Acetaminophen Q4H for pain and has appt with Pain Clinic tomorrow.

## 2017-04-17 NOTE — Assessment & Plan Note (Signed)
Currently taking: Venlafaxine 150mg  daily Quetiapine 300mg  daily Clonazepam 1mg  TID  She receives these medications from her psychiatrist, which she see's every 6 months and PRN.
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
## 2017-04-17 NOTE — Assessment & Plan Note (Signed)
Last lipid panel 2018 Currently taking Ezetimibe 10mg  and Simvastatin 40mg  daily

## 2017-04-17 NOTE — Assessment & Plan Note (Signed)
BP at goal 131/84, with slight tachycardia 106 Continue Lisinopril/HCTZ 20/12.5mg  Reduce-stop tobacco use

## 2017-04-17 NOTE — Assessment & Plan Note (Signed)
Last TSH 01/25/2017- 0.727 Currently on Levothyroxine 167mcg daily

## 2017-04-22 ENCOUNTER — Telehealth: Payer: Self-pay | Admitting: Adult Health

## 2017-04-22 NOTE — Telephone Encounter (Signed)
Patient called states while she was in for her initial new pt visit she was diagnosed w/ Elevated Sodium levels by prior PCP but not advised on what to do, says several different providers called with several different results from prior PCP office.   --- pt also states is under care of an Orthopedic provider who has here on meloxicam & cyclobenzaprine (just wanted provider to know) she has had allergic reactions to anti-inflammatories,  I advised her to contact the prescribing provider to let them know (since Dr. Jenetta Downer did not prescribe either.  Pt request a call back.  --glh

## 2017-04-23 NOTE — Telephone Encounter (Signed)
Pt states that [facility] had previously told her that her sodium was LOW (not elevated) and that she needed to drink less water.  Advised pt that once we receive records from their office, Valetta Fuller will review them and we will let her know if she has any further instructions.  Pt expressed understanding and is agreeable.  Charyl Bigger, CMA

## 2017-04-23 NOTE — Telephone Encounter (Signed)
LVM for pt to return call.  T. Nelson, CMA 

## 2017-05-06 ENCOUNTER — Other Ambulatory Visit: Payer: Self-pay | Admitting: Adult Health

## 2017-05-06 DIAGNOSIS — E871 Hypo-osmolality and hyponatremia: Secondary | ICD-10-CM

## 2017-05-06 NOTE — Telephone Encounter (Signed)
Good Afternoon Tonya, I have not seen any OV notes from [facility], however was able to review the abstracted labs from 01/2017.  Her last Na++ was low- 119 (normal 137-147). Please call her and ask her to schedule a lab draw appt so we can obtain accurate Na++ level and proceed from there. Thanks! Valetta Fuller

## 2017-05-07 ENCOUNTER — Other Ambulatory Visit: Payer: Self-pay

## 2017-05-07 NOTE — Telephone Encounter (Signed)
Pt informed.  T. Moritz Lever, CMA 

## 2017-05-07 NOTE — Telephone Encounter (Signed)
Pt informed of need for labs.  Pt scheduled for labs tomorrow.  Charyl Bigger, CMA

## 2017-05-08 ENCOUNTER — Other Ambulatory Visit: Payer: BLUE CROSS/BLUE SHIELD

## 2017-05-08 DIAGNOSIS — E871 Hypo-osmolality and hyponatremia: Secondary | ICD-10-CM

## 2017-05-09 LAB — BASIC METABOLIC PANEL
BUN/Creatinine Ratio: 7 — ABNORMAL LOW (ref 9–23)
BUN: 6 mg/dL (ref 6–24)
CO2: 26 mmol/L (ref 20–29)
Calcium: 10.1 mg/dL (ref 8.7–10.2)
Chloride: 87 mmol/L — ABNORMAL LOW (ref 96–106)
Creatinine, Ser: 0.84 mg/dL (ref 0.57–1.00)
GFR calc Af Amer: 90 mL/min/{1.73_m2} (ref >59)
GFR calc non Af Amer: 78 mL/min/{1.73_m2} (ref >59)
Glucose: 115 mg/dL — ABNORMAL HIGH (ref 65–99)
Potassium: 4.9 mmol/L (ref 3.5–5.2)
Sodium: 129 mmol/L — ABNORMAL LOW (ref 134–144)

## 2017-05-10 ENCOUNTER — Telehealth: Payer: Self-pay | Admitting: Adult Health

## 2017-05-23 ENCOUNTER — Emergency Department (HOSPITAL_COMMUNITY): Payer: BLUE CROSS/BLUE SHIELD

## 2017-05-23 ENCOUNTER — Encounter (HOSPITAL_COMMUNITY): Payer: Self-pay | Admitting: Emergency Medicine

## 2017-05-23 ENCOUNTER — Inpatient Hospital Stay (HOSPITAL_COMMUNITY)
Admission: EM | Admit: 2017-05-23 | Discharge: 2017-05-30 | DRG: 872 | Disposition: A | Discharge status: Home / Self Care | Payer: BLUE CROSS/BLUE SHIELD | Attending: Family Medicine | Admitting: Family Medicine

## 2017-05-23 DIAGNOSIS — Z720 Tobacco use: Secondary | ICD-10-CM | POA: Diagnosis present

## 2017-05-23 DIAGNOSIS — N390 Urinary tract infection, site not specified: Secondary | ICD-10-CM | POA: Diagnosis present

## 2017-05-23 DIAGNOSIS — E785 Hyperlipidemia, unspecified: Secondary | ICD-10-CM | POA: Diagnosis not present

## 2017-05-23 DIAGNOSIS — Z8249 Family history of ischemic heart disease and other diseases of the circulatory system: Secondary | ICD-10-CM

## 2017-05-23 DIAGNOSIS — Z800 Family history of malignant neoplasm of digestive organs: Secondary | ICD-10-CM

## 2017-05-23 DIAGNOSIS — E876 Hypokalemia: Secondary | ICD-10-CM | POA: Diagnosis not present

## 2017-05-23 DIAGNOSIS — Z9049 Acquired absence of other specified parts of digestive tract: Secondary | ICD-10-CM

## 2017-05-23 DIAGNOSIS — E039 Hypothyroidism, unspecified: Secondary | ICD-10-CM | POA: Diagnosis not present

## 2017-05-23 DIAGNOSIS — Z9109 Other allergy status, other than to drugs and biological substances: Secondary | ICD-10-CM

## 2017-05-23 DIAGNOSIS — Z7951 Long term (current) use of inhaled steroids: Secondary | ICD-10-CM

## 2017-05-23 DIAGNOSIS — I152 Hypertension secondary to endocrine disorders: Secondary | ICD-10-CM | POA: Diagnosis present

## 2017-05-23 DIAGNOSIS — Z1611 Resistance to penicillins: Secondary | ICD-10-CM | POA: Diagnosis present

## 2017-05-23 DIAGNOSIS — Z8041 Family history of malignant neoplasm of ovary: Secondary | ICD-10-CM

## 2017-05-23 DIAGNOSIS — F4310 Post-traumatic stress disorder, unspecified: Secondary | ICD-10-CM | POA: Diagnosis present

## 2017-05-23 DIAGNOSIS — Z7984 Long term (current) use of oral hypoglycemic drugs: Secondary | ICD-10-CM

## 2017-05-23 DIAGNOSIS — F418 Other specified anxiety disorders: Secondary | ICD-10-CM | POA: Diagnosis present

## 2017-05-23 DIAGNOSIS — K529 Noninfective gastroenteritis and colitis, unspecified: Secondary | ICD-10-CM | POA: Diagnosis not present

## 2017-05-23 DIAGNOSIS — Z8542 Personal history of malignant neoplasm of other parts of uterus: Secondary | ICD-10-CM

## 2017-05-23 DIAGNOSIS — N179 Acute kidney failure, unspecified: Secondary | ICD-10-CM

## 2017-05-23 DIAGNOSIS — N3000 Acute cystitis without hematuria: Secondary | ICD-10-CM

## 2017-05-23 DIAGNOSIS — R402252 Coma scale, best verbal response, oriented, at arrival to emergency department: Secondary | ICD-10-CM | POA: Diagnosis present

## 2017-05-23 DIAGNOSIS — Z803 Family history of malignant neoplasm of breast: Secondary | ICD-10-CM

## 2017-05-23 DIAGNOSIS — W19XXXA Unspecified fall, initial encounter: Secondary | ICD-10-CM

## 2017-05-23 DIAGNOSIS — Z90710 Acquired absence of both cervix and uterus: Secondary | ICD-10-CM

## 2017-05-23 DIAGNOSIS — Z823 Family history of stroke: Secondary | ICD-10-CM

## 2017-05-23 DIAGNOSIS — M4800 Spinal stenosis, site unspecified: Secondary | ICD-10-CM | POA: Diagnosis present

## 2017-05-23 DIAGNOSIS — E119 Type 2 diabetes mellitus without complications: Secondary | ICD-10-CM | POA: Diagnosis present

## 2017-05-23 DIAGNOSIS — G43909 Migraine, unspecified, not intractable, without status migrainosus: Secondary | ICD-10-CM | POA: Diagnosis present

## 2017-05-23 DIAGNOSIS — N182 Chronic kidney disease, stage 2 (mild): Secondary | ICD-10-CM

## 2017-05-23 DIAGNOSIS — Z79890 Hormone replacement therapy (postmenopausal): Secondary | ICD-10-CM

## 2017-05-23 DIAGNOSIS — B9620 Unspecified Escherichia coli [E. coli] as the cause of diseases classified elsewhere: Secondary | ICD-10-CM | POA: Diagnosis present

## 2017-05-23 DIAGNOSIS — A419 Sepsis, unspecified organism: Secondary | ICD-10-CM | POA: Diagnosis not present

## 2017-05-23 DIAGNOSIS — G8929 Other chronic pain: Secondary | ICD-10-CM | POA: Diagnosis present

## 2017-05-23 DIAGNOSIS — E871 Hypo-osmolality and hyponatremia: Secondary | ICD-10-CM | POA: Diagnosis present

## 2017-05-23 DIAGNOSIS — E860 Dehydration: Secondary | ICD-10-CM | POA: Diagnosis present

## 2017-05-23 DIAGNOSIS — Z880 Allergy status to penicillin: Secondary | ICD-10-CM

## 2017-05-23 DIAGNOSIS — E7800 Pure hypercholesterolemia, unspecified: Secondary | ICD-10-CM | POA: Diagnosis present

## 2017-05-23 DIAGNOSIS — Z791 Long term (current) use of non-steroidal anti-inflammatories (NSAID): Secondary | ICD-10-CM

## 2017-05-23 DIAGNOSIS — E1159 Type 2 diabetes mellitus with other circulatory complications: Secondary | ICD-10-CM | POA: Diagnosis present

## 2017-05-23 DIAGNOSIS — Z833 Family history of diabetes mellitus: Secondary | ICD-10-CM

## 2017-05-23 DIAGNOSIS — R402142 Coma scale, eyes open, spontaneous, at arrival to emergency department: Secondary | ICD-10-CM | POA: Diagnosis present

## 2017-05-23 DIAGNOSIS — F17210 Nicotine dependence, cigarettes, uncomplicated: Secondary | ICD-10-CM | POA: Diagnosis present

## 2017-05-23 DIAGNOSIS — E1169 Type 2 diabetes mellitus with other specified complication: Secondary | ICD-10-CM | POA: Diagnosis present

## 2017-05-23 DIAGNOSIS — I10 Essential (primary) hypertension: Secondary | ICD-10-CM | POA: Diagnosis present

## 2017-05-23 DIAGNOSIS — R402362 Coma scale, best motor response, obeys commands, at arrival to emergency department: Secondary | ICD-10-CM | POA: Diagnosis present

## 2017-05-23 DIAGNOSIS — E861 Hypovolemia: Secondary | ICD-10-CM | POA: Diagnosis not present

## 2017-05-23 LAB — COMPREHENSIVE METABOLIC PANEL
ALT: 24 U/L (ref 14–54)
AST: 39 U/L (ref 15–41)
Albumin: 3.3 g/dL — ABNORMAL LOW (ref 3.5–5.0)
Alkaline Phosphatase: 141 U/L — ABNORMAL HIGH (ref 38–126)
Anion gap: 16 — ABNORMAL HIGH (ref 5–15)
BUN: 40 mg/dL — ABNORMAL HIGH (ref 6–20)
CO2: 21 mmol/L — ABNORMAL LOW (ref 22–32)
Calcium: 8.8 mg/dL — ABNORMAL LOW (ref 8.9–10.3)
Chloride: 83 mmol/L — ABNORMAL LOW (ref 101–111)
Creatinine, Ser: 2.71 mg/dL — ABNORMAL HIGH (ref 0.44–1.00)
GFR calc Af Amer: 21 mL/min — ABNORMAL LOW (ref >60)
GFR calc non Af Amer: 19 mL/min — ABNORMAL LOW (ref >60)
Glucose, Bld: 303 mg/dL — ABNORMAL HIGH (ref 65–99)
Potassium: 3.7 mmol/L (ref 3.5–5.1)
Sodium: 120 mmol/L — ABNORMAL LOW (ref 135–145)
Total Bilirubin: 0.7 mg/dL (ref 0.3–1.2)
Total Protein: 7.1 g/dL (ref 6.5–8.1)

## 2017-05-23 LAB — URINALYSIS, ROUTINE W REFLEX MICROSCOPIC
Bilirubin Urine: NEGATIVE
Glucose, UA: 150 mg/dL — AB
Ketones, ur: NEGATIVE mg/dL
Nitrite: NEGATIVE
Protein, ur: NEGATIVE mg/dL
Specific Gravity, Urine: 1.014 (ref 1.005–1.030)
pH: 5 (ref 5.0–8.0)

## 2017-05-23 LAB — CBC
HCT: 35.1 % — ABNORMAL LOW (ref 36.0–46.0)
Hemoglobin: 13.2 g/dL (ref 12.0–15.0)
MCH: 33.6 pg (ref 26.0–34.0)
MCHC: 37.6 g/dL — ABNORMAL HIGH (ref 30.0–36.0)
MCV: 89.3 fL (ref 78.0–100.0)
Platelets: 253 10*3/uL (ref 150–400)
RBC: 3.93 MIL/uL (ref 3.87–5.11)
RDW: 13.1 % (ref 11.5–15.5)
WBC: 18.8 10*3/uL — ABNORMAL HIGH (ref 4.0–10.5)

## 2017-05-23 LAB — I-STAT BETA HCG BLOOD, ED (MC, WL, AP ONLY): I-stat hCG, quantitative: 47.7 m[IU]/mL — ABNORMAL HIGH (ref <5)

## 2017-05-23 LAB — CBG MONITORING, ED
Glucose-Capillary: 281 mg/dL — ABNORMAL HIGH (ref 65–99)
Glucose-Capillary: 294 mg/dL — ABNORMAL HIGH (ref 65–99)

## 2017-05-23 LAB — LIPASE, BLOOD: Lipase: 17 U/L (ref 11–51)

## 2017-05-23 LAB — POC URINE PREG, ED: Preg Test, Ur: NEGATIVE

## 2017-05-23 MED ORDER — SODIUM CHLORIDE 0.9 % IV BOLUS (SEPSIS)
2000.0000 mL | Freq: Once | INTRAVENOUS | Status: AC
  Administered 2017-05-23: 2000 mL via INTRAVENOUS

## 2017-05-23 MED ORDER — SODIUM CHLORIDE 0.9 % IV SOLN
INTRAVENOUS | Status: DC
  Administered 2017-05-23: 23:00:00 via INTRAVENOUS

## 2017-05-23 MED ORDER — CIPROFLOXACIN IN D5W 400 MG/200ML IV SOLN
400.0000 mg | Freq: Once | INTRAVENOUS | Status: AC
  Administered 2017-05-23: 400 mg via INTRAVENOUS
  Filled 2017-05-23: qty 200

## 2017-05-23 MED ORDER — MORPHINE SULFATE (PF) 4 MG/ML IV SOLN
4.0000 mg | Freq: Once | INTRAVENOUS | Status: AC
  Administered 2017-05-23: 4 mg via INTRAVENOUS
  Filled 2017-05-23: qty 1

## 2017-05-23 MED ORDER — METRONIDAZOLE IN NACL 5-0.79 MG/ML-% IV SOLN
500.0000 mg | Freq: Once | INTRAVENOUS | Status: AC
  Administered 2017-05-24: 500 mg via INTRAVENOUS
  Filled 2017-05-23: qty 100

## 2017-05-23 NOTE — ED Notes (Signed)
Patient waiting in lobby with IV in place. Patient reports she will make the triage RN aware if she decides to leave so IV can removed.

## 2017-05-23 NOTE — ED Triage Notes (Signed)
Per EMS, patient coming from home, c/o diarrhea with lower abdominal pain x3 days. Denies N/V. A&Ox4. Ambulatory. Denies chest pain and SOB.  24g L hand 250 ml NS with EMS  HR 102 BP 160/72 O2 96% CBG 338

## 2017-05-23 NOTE — H&P (Addendum)
History and Physical    Alejandra Taylor QIH:[id number] DOB: 06-05-1961 DOA: 05/23/2017  Referring MD/NP/PA:   PCP: Esaw Grandchild, NP   Patient coming from:  The patient is coming from home.  At baseline, pt is independent for most of ADL.   Chief Complaint: Abdominal pain, diarrhea, dysuria  HPI: Alejandra Taylor is a 56 y.o. female with medical history significant of hypertension, hyperlipidemia, diabetes mellitus, GERD, hypothyroidism, depression with anxiety, spinal stenosis, chronic back pain, endometrial cancer (s/p of hysterectomy 2011, no chemotherapy or radiation therapy), tobacco abuse, who presents with abdominal pain, diarrhea and dysuria.  Patient states that she has been having diarrhea and abdominal pain in the past 3 days, which has been progressively getting worse. Her abdominal pain is located in the lower abdomen, constant, 7/10, nonradiating, sharp. She has had more than 10 times of watery diarrhea today. She has nausea, but no vomiting. Denies fever or chills. She states that she has lightheadedness and generalized weakness. She also reports dysuria and burning on urination. She denies chest pain, SOB, cough, unilateral weakness. No vision change or hearing loss.  ED Course: pt was found to have WBC 18.8, positive urinalysis with moderate amount of leukocytes, sodium 120, acute renal injury with creatinine 2.71, temperature normal, tachycardia, tachypnea, oxygen saturation 100% on room air. CT abdomen/pelvis showed transverse and descending infectious or inflammatory colitis. No evidence for perforation or abscess. Patient is admitted to telemetry bed as inpatient.  Review of Systems:   General: no fevers, chills, no body weight gain, has poor appetite, has fatigue HEENT: no blurry vision, hearing changes or sore throat Respiratory: no dyspnea, coughing, wheezing CV: no chest pain, no palpitations  GI: has nausea, abdominal pain, diarrhea, no constipation, vomiting,    GU: has dysuria, burning on urination, increased urinary frequency, no hematuria  Ext: no leg edema Neuro: no unilateral weakness, numbness, or tingling, no vision change or hearing loss Skin: no rash, no skin tear. MSK: No muscle spasm, no deformity, no limitation of range of movement in spin Heme: No easy bruising.  Travel history: No recent long distant travel.  Allergy:  Allergies  Allergen Reactions  . Other Hives    neoprene  . Penicillins Rash    Past Medical History:  Diagnosis Date  . Chronic back pain   . Diabetes mellitus   . 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 History of degenerative disc disease   . Hypercholesterolemia   . Hypertension   . Hyperthyroidism   . Migraines   . Post traumatic stress disorder   . Spinal stenosis     Past Surgical History:  Procedure Laterality Date  . ABDOMINAL HYSTERECTOMY    . CHOLECYSTECTOMY    . GANGLION CYST EXCISION     L wrist cyst removal with tendon rupture and repair  . ROBOTIC ASSISTED LAP VAGINAL HYSTERECTOMY  08/19/2009   BSO for endo ca  . TONSILLECTOMY      Social History:  reports that she has been smoking cigarettes.  She has a 30.00 pack-year smoking history. she has never used smokeless tobacco. She reports that she does not drink alcohol or use drugs.  Family History:  Family History  Problem Relation Age of Onset  . Hypertension Father   . Diabetes Father   . Heart attack Father   . Pancreatic cancer Father   . Colon cancer Maternal Aunt   . Ovarian cancer Maternal Aunt   . Ovarian cancer Paternal Grandmother   . Stroke Paternal Grandmother   .  Diabetes Paternal Grandmother   . Ovarian cancer Cousin   . Breast cancer Cousin      Prior to Admission medications   Medication Sig Start Date End Date Taking? Authorizing Provider  clonazePAM (KLONOPIN) 1 MG tablet Take 1 mg by mouth 3 (three) times daily. 03/08/15  Yes [provider]  ezetimibe (ZETIA) 10 MG tablet Take 1 tablet (10  mg total) by mouth daily. 04/17/17  Yes Danford, Valetta Fuller D, NP  HYDROcodone-acetaminophen (NORCO) 10-325 MG tablet TAKE 1 OR 2 TABLETS BY MOUTH EVERY 6 HOURS AS NEEDED FOR PAIN MUST LAST 30 DAYS 03/16/15  Yes [provider]  levothyroxine (SYNTHROID, LEVOTHROID) 125 MCG tablet Take 1 tablet (125 mcg total) by mouth daily. 04/17/17  Yes Danford, Valetta Fuller D, NP  lisinopril-hydrochlorothiazide (PRINZIDE,ZESTORETIC) 20-12.5 MG tablet Take 1 tablet by mouth 2 (two) times daily. 04/17/17  Yes Danford, Valetta Fuller D, NP  meloxicam (MOBIC) 15 MG tablet TAKE 1 TABLET BY MOUTH DAILY WITH FOOD AS NEEDED FOR PAIN 04/19/17  Yes [provider]  metFORMIN (GLUCOPHAGE) 500 MG tablet Take 1 tablet (500 mg total) by mouth 2 (two) times daily with a meal. 04/17/17  Yes Danford, Katy D, NP  methocarbamol (ROBAXIN) 500 MG tablet Take 500 mg by mouth 2 (two) times daily.   Yes [provider]  pantoprazole (PROTONIX) 40 MG tablet Take 1 tablet (40 mg total) by mouth daily. 04/17/17  Yes Danford, Valetta Fuller D, NP  QUEtiapine (SEROQUEL XR) 300 MG 24 hr tablet Take 300 mg by mouth at bedtime.   Yes [provider]  simvastatin (ZOCOR) 40 MG tablet Take 1 tablet (40 mg total) by mouth every evening. 04/17/17  Yes Danford, Valetta Fuller D, NP  venlafaxine XR (EFFEXOR-XR) 75 MG 24 hr capsule Take 75 mg by mouth at bedtime. 04/19/17  Yes [provider]  albuterol (PROVENTIL HFA;VENTOLIN HFA) 108 (90 BASE) MCG/ACT inhaler Inhale 2 puffs into the lungs every 6 (six) hours as needed for wheezing or shortness of breath.     [provider]  B Complex-C (SUPER B COMPLEX PO) Take 1 tablet by mouth daily.     [provider]  Calcium Carbonate-Vitamin D (CALCIUM 600+D) 600-400 MG-UNIT per tablet Take 1 tablet by mouth daily.    [provider]  estazolam (PROSOM) 2 MG tablet TAKE 1 TABLET AT BEDTIME AS NEEDED FOR SLEEP MAY REPEAT ONCE AS NEEDED ONLY IF MID-AWAKENING 03/11/15   [provider]  fish oil-omega-3 fatty acids 1000 MG capsule Take 1 g by mouth daily.    [provider]  fluticasone (FLONASE) 50 MCG/ACT nasal spray Place 1 spray into both nostrils daily as needed for allergies.     [provider]  folic acid (FOLVITE) 242 MCG tablet Take 400 mcg by mouth daily.    [provider]  glucose blood test strip 1 each by Other route as needed. Use as instructed    [provider]  Methylcellulose, Laxative, (FIBER THERAPY) 500 MG TABS Take 1 tablet by mouth daily.    [provider]  Multiple Vitamins-Minerals (MULTIVITAMIN PO) Take 1 tablet by mouth daily.    [provider]  NARCAN 4 MG/0.1ML LIQD nasal spray kit 1 AS NEEDED FOR OVERDOSE 04/18/17   [provider]  psyllium (METAMUCIL) 58.6 % powder Take 1 packet by mouth 3 (three) times daily.    [provider]  tiZANidine (ZANAFLEX) 4 MG capsule Take 4 mg by mouth. One tablet as needed  orally once a day at night for muscle spasm    [provider]  topiramate (TOPAMAX) 100 MG tablet Take 2 tablets (200 mg total) by mouth at bedtime. 04/17/17   Danford, Valetta Fuller D, NP  venlafaxine XR (EFFEXOR-XR) 150 MG 24 hr capsule Take 150 mg by mouth. Take 2 capsules every morning and 1 capsule at night 03/24/17   [provider]  vitamin C (ASCORBIC ACID) 500 MG tablet Take 500 mg by mouth daily.    [provider]  vitamin E 200 UNIT capsule Take 200 Units by mouth daily.    [provider]    Physical Exam: Vitals:   05/23/17 2052 05/23/17 2330 05/24/17 0143 05/24/17 0538  BP: (!) 144/64 (!) 163/88 139/67 (!) 145/66  Pulse: (!) 107 (!) 102 (!) 101 91  Resp: '18 16 20 ' (!) 21  Temp:      TempSrc:      SpO2: 100% 100% 98% 96%  Weight:      Height:       General: Not in acute distress HEENT:       Eyes: PERRL, EOMI, no scleral icterus.       ENT: No discharge from the ears and nose, no pharynx injection, no  tonsillar enlargement.        Neck: No JVD, no bruit, no mass felt. Heme: No neck lymph node enlargement. Cardiac: S1/S2, RRR, No murmurs, No gallops or rubs. Respiratory:  No rales, wheezing, rhonchi or rubs. GI: Soft, nondistended, has tenderness in lower abdomen, no rebound pain, no organomegaly, BS present. GU: No hematuria Ext: No pitting leg edema bilaterally. 2+DP/PT pulse bilaterally. Musculoskeletal: No joint deformities, No joint redness or warmth, no limitation of ROM in spin. Skin: No rashes.  Neuro: Alert, oriented X3, cranial nerves II-XII grossly intact, moves all extremities normally.  Psych: Patient is not psychotic, no suicidal or hemocidal ideation.  Labs on Admission: I have personally reviewed following labs and imaging studies  CBC: Recent Labs  Lab 05/23/17 2103 05/24/17 0601  WBC 18.8* 15.7*  HGB 13.2 11.3*  HCT 35.1* 30.8*  MCV 89.3 90.1  PLT 253 650   Basic Metabolic Panel: Recent Labs  Lab 05/23/17 2103 05/24/17 0500 05/24/17 0601  NA 120* QUESTIONABLE RESULTS, RECOMMEND RECOLLECT TO VERIFY 120*  K 3.7 QUESTIONABLE RESULTS, RECOMMEND RECOLLECT TO VERIFY 3.5  CL 83* QUESTIONABLE RESULTS, RECOMMEND RECOLLECT TO VERIFY 85*  CO2 21* QUESTIONABLE RESULTS, RECOMMEND RECOLLECT TO VERIFY 21*  GLUCOSE 303* QUESTIONABLE RESULTS, RECOMMEND RECOLLECT TO VERIFY 169*  BUN 40* QUESTIONABLE RESULTS, RECOMMEND RECOLLECT TO VERIFY 41*  CREATININE 2.71* QUESTIONABLE RESULTS, RECOMMEND RECOLLECT TO VERIFY 2.67*  CALCIUM 8.8* QUESTIONABLE RESULTS, RECOMMEND RECOLLECT TO VERIFY 8.2*   GFR: Estimated Creatinine Clearance: 25.9 mL/min (A) (by C-G formula based on SCr of 2.67 mg/dL (H)). Liver Function Tests: Recent Labs  Lab 05/23/17 2103  AST 39  ALT 24  ALKPHOS 141*  BILITOT 0.7  PROT 7.1  ALBUMIN 3.3*   Recent Labs  Lab 05/23/17 2103  LIPASE 17   No results for input(s): AMMONIA in the last 168 hours. Coagulation Profile: No results for input(s):  INR, PROTIME in the last 168 hours. Cardiac Enzymes: No results for input(s): CKTOTAL, CKMB, CKMBINDEX, TROPONINI in the last 168 hours. BNP (last 3 results) No results for input(s): PROBNP in the last 8760 hours. HbA1C: No results for input(s): HGBA1C in the last 72 hours. CBG: Recent Labs  Lab 05/23/17 1815 05/23/17 2052  GLUCAP 281* 294*  Lipid Profile: No results for input(s): CHOL, HDL, LDLCALC, TRIG, CHOLHDL, LDLDIRECT in the last 72 hours. Thyroid Function Tests: No results for input(s): TSH, T4TOTAL, FREET4, T3FREE, THYROIDAB in the last 72 hours. Anemia Panel: No results for input(s): VITAMINB12, FOLATE, FERRITIN, TIBC, IRON, RETICCTPCT in the last 72 hours. Urine analysis:    Component Value Date/Time   COLORURINE YELLOW 05/23/2017 2150   APPEARANCEUR CLOUDY (A) 05/23/2017 2150   LABSPEC 1.014 05/23/2017 2150   PHURINE 5.0 05/23/2017 2150   GLUCOSEU 150 (A) 05/23/2017 2150   HGBUR MODERATE (A) 05/23/2017 2150   BILIRUBINUR NEGATIVE 05/23/2017 2150   KETONESUR NEGATIVE 05/23/2017 2150   PROTEINUR NEGATIVE 05/23/2017 2150   NITRITE NEGATIVE 05/23/2017 2150   LEUKOCYTESUR MODERATE (A) 05/23/2017 2150   Sepsis Labs: '@LABRCNTIP' (procalcitonin:4,lacticidven:4) )No results found for this or any previous visit (from the past 240 hour(s)).   Radiological Exams on Admission: Ct Abdomen Pelvis Wo Contrast  Result Date: 05/23/2017 CLINICAL DATA:  56 y/o F; diarrhea and lower abdominal pain for 3 days. History of endometrial carcinoma, diabetes, and hypertension. EXAM: CT ABDOMEN AND PELVIS WITHOUT CONTRAST TECHNIQUE: Multidetector CT imaging of the abdomen and pelvis was performed following the standard protocol without IV contrast. COMPARISON:  None. FINDINGS: Lower chest: Mild coronary artery calcific atherosclerosis. Subcentimeter calcified granuloma in the right lung base. Hepatobiliary: No focal liver abnormality is seen. Status post cholecystectomy. No biliary  dilatation. Pancreas: Unremarkable. No pancreatic ductal dilatation or surrounding inflammatory changes. Spleen: Normal in size without focal abnormality. Adrenals/Urinary Tract: Adrenal glands are unremarkable. Kidneys are normal, without renal calculi, focal lesion, or hydronephrosis. Bladder is unremarkable. Stomach/Bowel: Normal stomach and small bowel. Mild diffuse wall thickening of transverse colon and descending colon with loss of normal features compatible with acute colitis. No evidence for perforation or abscess. Normal appendix. Vascular/Lymphatic: Moderate aortic atherosclerosis. No enlarged abdominal or pelvic lymph nodes. Reproductive: Status post hysterectomy. No adnexal masses. Other: No abdominal wall hernia or abnormality. No abdominopelvic ascites. Musculoskeletal: Chronic right anterolateral rib fractures. Grade 1 L4-5 anterolisthesis. No acute fracture. IMPRESSION: 1. Transverse and descending infectious or inflammatory colitis. No evidence for perforation or abscess. 2. Coronary and aortic calcific atherosclerosis. Electronically Signed   By: Kristine Garbe M.D.   On: 05/23/2017 22:47    EKG: Not done in ED, will get one.   Assessment/Plan Principal Problem:   Colitis Active Problems:   Essential hypertension   Hypothyroidism   Hyperlipidemia   Tobacco abuse   UTI (urinary tract infection)   Depression with anxiety   AKI (acute kidney injury) ([place])   Hyponatremia   Colitis and sepsis: Patient admitted with you for sepsis with leukocytosis, tachycardia and tachypnea. Currently hemodynamically stable. -Admitted to telemetry bed as inpatient in -IV Cipro and Flagyl -Check C. difficile PCR -When necessary Zofran for nausea and Dilaudid for pain -will get Procalcitonin and trend lactic acid levels per sepsis protocol. -IVF: 2L of NS bolus. Will need to repeat BMP before giving more IVF due to hyponatremia and avoid correction too fast  Addendum: AM BMP showed Na  120 -will start NS at 125 cc/h  UTI: -On Cipro and Flagyl which should cover -Follow up blood culture and urine culture-  Hyponatremia: Most likely due to diarrhea.  Prinzide use may have contributed partially -Check TSH -IV fluid as above -Hold Prinzide due to sepsis and hyponatremia -Repeat  BMP  Essential hypertension: --Hold Prinzide due to sepsis and hyponatremia -IV hydralazine when necessary  Hypothyroidism: Last TSH was 0.73 on  8/60/17 -Continue home Synthroid  Hyperlipidemia: -Zocor and zetia  Tobacco abuse: -Did counseling about importance of quitting smoking -Nicotine patch  AKI: Likely due to prerenal secondary to dehydration and continuation of ACEI, diuretics, NSAIDs - IVF as above - Check FeUrea - Follow up renal function by BMP - Hold prinzide and mobic  Depression and anxiety: Stable, no suicidal or homicidal ideations. -Continue home medications  DVT ppx:  SQ Lovenox Code Status: Full code Family Communication: None at bed side.    Disposition Plan:  Anticipate discharge back to previous home environment Consults called:  none Admission status:   Inpatient/tele      Date of Service 05/24/2017    Ivor Costa [facility] Pager [phone]  If 7PM-7AM, please contact night-coverage [email or web address] Password [email or web address] 05/24/2017, 7:29 AM

## 2017-05-23 NOTE — ED Provider Notes (Signed)
[facility] DEPT Provider Note   CSN: [id number] Arrival date & time: 05/23/17  1459     History   Chief Complaint Chief Complaint  Patient presents with  . Diarrhea    HPI Alejandra Taylor is a 56 y.o. female.  56 year old female who presents with watery diarrhea that became bloody which she states is probably from her hemorrhoids times 3 days.  Has had diffuse weakness that is worse when he tries to stand up.  No fever or chills.  She has had nausea but no vomiting.  Denies any recent antibiotic use or travel history.  Denies any sick exposures.  Called EMS and was transported here      Past Medical History:  Diagnosis Date  . Chronic back pain   . Diabetes mellitus   . 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 History of degenerative disc disease   . Hypercholesterolemia   . Hypertension   . Hyperthyroidism   . Migraines   . Post traumatic stress disorder   . Spinal stenosis     Patient Active Problem List   Diagnosis Date Noted  . Migraine with status migrainosus, not intractable 04/17/2017  . Other chronic pain 04/17/2017  . Essential hypertension 04/17/2017  . PTSD (post-traumatic stress disorder) 04/17/2017  . Hypothyroidism 04/17/2017  . Hyperlipidemia 04/17/2017  . Endometrial cancer ([place]) 01/22/2012    Past Surgical History:  Procedure Laterality Date  . ABDOMINAL HYSTERECTOMY    . CHOLECYSTECTOMY    . GANGLION CYST EXCISION     L wrist cyst removal with tendon rupture and repair  . ROBOTIC ASSISTED LAP VAGINAL HYSTERECTOMY  08/19/2009   BSO for endo ca  . TONSILLECTOMY      OB History    No data available       Home Medications    Prior to Admission medications   Medication Sig Start Date End Date Taking? Authorizing Provider  albuterol (PROVENTIL HFA;VENTOLIN HFA) 108 (90 BASE) MCG/ACT inhaler Inhale 1 puff into the lungs as needed.    [provider]  B Complex-C (SUPER B COMPLEX PO) Take by  mouth.    [provider]  Calcium Carbonate-Vitamin D (CALCIUM 600+D) 600-400 MG-UNIT per tablet Take 1 tablet by mouth daily.    [provider]  clonazePAM (KLONOPIN) 1 MG tablet Take 1 mg by mouth 3 (three) times daily. 03/08/15   [provider]  estazolam (PROSOM) 2 MG tablet TAKE 1 TABLET AT BEDTIME AS NEEDED FOR SLEEP MAY REPEAT ONCE AS NEEDED ONLY IF MID-AWAKENING 03/11/15   [provider]  ezetimibe (ZETIA) 10 MG tablet Take 1 tablet (10 mg total) by mouth daily. 04/17/17   Danford, Valetta Fuller D, NP  fish oil-omega-3 fatty acids 1000 MG capsule Take 1 g by mouth daily.    [provider]  fluticasone (FLONASE) 50 MCG/ACT nasal spray Place 1 spray into both nostrils daily.    [provider]  folic acid (FOLVITE) 242 MCG tablet Take 400 mcg by mouth daily.    [provider]  glucose blood test strip 1 each by Other route as needed. Use as instructed    [provider]  HYDROcodone-acetaminophen (NORCO) 10-325 MG tablet TAKE 1 OR 2 TABLETS BY MOUTH EVERY 6 HOURS AS NEEDED FOR PAIN MUST LAST 30 DAYS 03/16/15   [provider]  levothyroxine (SYNTHROID, LEVOTHROID) 125 MCG tablet Take 1 tablet (125 mcg total) by mouth daily. 04/17/17   Danford, Berna Spare, NP  lisinopril-hydrochlorothiazide (  PRINZIDE,ZESTORETIC) 20-12.5 MG tablet Take 1 tablet by mouth 2 (two) times daily. 04/17/17   Danford, Valetta Fuller D, NP  meloxicam (MOBIC) 15 MG tablet TAKE 1 TABLET BY MOUTH DAILY WITH FOOD AS NEEDED 04/19/17   [provider]  metFORMIN (GLUCOPHAGE) 500 MG tablet Take 1 tablet (500 mg total) by mouth 2 (two) times daily with a meal. 04/17/17   Danford, Valetta Fuller D, NP  methocarbamol (ROBAXIN) 500 MG tablet Take 500 mg by mouth 2 (two) times daily.    [provider]  Methylcellulose, Laxative, (FIBER THERAPY) 500 MG TABS Take 1 tablet by mouth daily.    [provider]  Multiple Vitamins-Minerals (MULTIVITAMIN PO) Take 1  tablet by mouth daily.    [provider]  NARCAN 4 MG/0.1ML LIQD nasal spray kit 1 AS NEEDED 04/18/17   [provider]  pantoprazole (PROTONIX) 40 MG tablet Take 1 tablet (40 mg total) by mouth daily. 04/17/17   Danford, Valetta Fuller D, NP  psyllium (METAMUCIL) 58.6 % powder Take 1 packet by mouth 3 (three) times daily.    [provider]  QUEtiapine (SEROQUEL XR) 300 MG 24 hr tablet Take 300 mg by mouth at bedtime.    [provider]  simvastatin (ZOCOR) 40 MG tablet Take 1 tablet (40 mg total) by mouth every evening. 04/17/17   Danford, Valetta Fuller D, NP  tiZANidine (ZANAFLEX) 4 MG capsule Take 4 mg by mouth. One tablet as needed orally once a day at night for muscle spasm    [provider]  topiramate (TOPAMAX) 100 MG tablet Take 2 tablets (200 mg total) by mouth at bedtime. 04/17/17   Danford, Valetta Fuller D, NP  venlafaxine XR (EFFEXOR-XR) 150 MG 24 hr capsule Take 150 mg by mouth. Take 2 capsules every morning and 1 capsule at night 03/24/17   [provider]  venlafaxine XR (EFFEXOR-XR) 75 MG 24 hr capsule Take 75 mg by mouth at bedtime. 04/19/17   [provider]  vitamin C (ASCORBIC ACID) 500 MG tablet Take 500 mg by mouth daily.    [provider]  vitamin E 200 UNIT capsule Take 200 Units by mouth daily.    [provider]    Family History Family History  Problem Relation Age of Onset  . Hypertension Father   . Diabetes Father   . Heart attack Father   . Pancreatic cancer Father   . Colon cancer Maternal Aunt   . Ovarian cancer Maternal Aunt   . Ovarian cancer Paternal Grandmother   . Stroke Paternal Grandmother   . Diabetes Paternal Grandmother   . Ovarian cancer Cousin   . Breast cancer Cousin     Social History Social History   Tobacco Use  . Smoking status: Current Every Day Smoker    Packs/day: 1.00    Years: 30.00    Pack years: 30.00    Types: Cigarettes  . Smokeless tobacco: Never Used  Substance  Use Topics  . Alcohol use: No  . Drug use: No     Allergies   Other and Penicillins   Review of Systems Review of Systems  All other systems reviewed and are negative.    Physical Exam Updated Vital Signs BP (!) 144/64 (BP Location: Left Arm)   Pulse (!) 107   Temp 98.6 F (37 C) (Oral)   Resp 18   Ht 1.702 m ('5\' 7"' )   Wt 81.6 kg (180 lb)   SpO2 100%   BMI 28.19 kg/m  Physical Exam  Constitutional: She is oriented to person, place, and time. She appears well-developed and well-nourished.  Non-toxic appearance. No distress.  HENT:  Head: Normocephalic and atraumatic.  Eyes: Conjunctivae, EOM and lids are normal. Pupils are equal, round, and reactive to light.  Neck: Normal range of motion. Neck supple. No tracheal deviation present. No thyroid mass present.  Cardiovascular: Regular rhythm and normal heart sounds. Tachycardia present. Exam reveals no gallop.  No murmur heard. Pulmonary/Chest: Effort normal and breath sounds normal. No stridor. No respiratory distress. She has no decreased breath sounds. She has no wheezes. She has no rhonchi. She has no rales.  Abdominal: Soft. Normal appearance and bowel sounds are normal. She exhibits no distension. There is generalized tenderness. There is no rebound and no CVA tenderness.  Genitourinary: Rectal exam shows no external hemorrhoid, no fissure and no tenderness.  Musculoskeletal: Normal range of motion. She exhibits no edema or tenderness.  Neurological: She is alert and oriented to person, place, and time. She has normal strength. No cranial nerve deficit or sensory deficit. GCS eye subscore is 4. GCS verbal subscore is 5. GCS motor subscore is 6.  Skin: Skin is warm and dry. No abrasion and no rash noted.  Psychiatric: She has a normal mood and affect. Her speech is normal and behavior is normal.  Nursing note and vitals reviewed.    ED Treatments / Results  Labs (all labs ordered are listed, but only abnormal  results are displayed) Labs Reviewed  COMPREHENSIVE METABOLIC PANEL - Abnormal; Notable for the following components:      Result Value   Sodium 120 (*)    Chloride 83 (*)    CO2 21 (*)    Glucose, Bld 303 (*)    BUN 40 (*)    Creatinine, Ser 2.71 (*)    Calcium 8.8 (*)    Albumin 3.3 (*)    Alkaline Phosphatase 141 (*)    GFR calc non Af Amer 19 (*)    GFR calc Af Amer 21 (*)    Anion gap 16 (*)    All other components within normal limits  CBG MONITORING, ED - Abnormal; Notable for the following components:   Glucose-Capillary 281 (*)    All other components within normal limits  CBG MONITORING, ED - Abnormal; Notable for the following components:   Glucose-Capillary 294 (*)    All other components within normal limits  LIPASE, BLOOD  CBC  URINALYSIS, ROUTINE W REFLEX MICROSCOPIC  I-STAT BETA HCG BLOOD, ED ([facility], [facility], [facility] ONLY)    EKG  EKG Interpretation None       Radiology No results found.  Procedures Procedures (including critical care time)  Medications Ordered in ED Medications  0.9 %  sodium chloride infusion (not administered)  sodium chloride 0.9 % bolus 2,000 mL (not administered)  morphine 4 MG/ML injection 4 mg (not administered)     Initial Impression / Assessment and Plan / ED Course  I have reviewed the triage vital signs and the nursing notes.  Pertinent labs & imaging results that were available during my care of the patient were reviewed by me and considered in my medical decision making (see chart for details).     Patient with evidence of acute kidney injury as well as severe dehydration.  Abdominal CT consistent with colitis.  IV antibiotics started as well as IV hydration will be admitted to the hospitalist service.  Final Clinical Impressions(s) / ED Diagnoses   Final diagnoses:  None    ED Discharge Orders    None       Lacretia Leigh, MD 05/23/17 2309
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
## 2017-05-24 ENCOUNTER — Other Ambulatory Visit: Payer: Self-pay

## 2017-05-24 ENCOUNTER — Encounter (HOSPITAL_COMMUNITY): Payer: Self-pay

## 2017-05-24 DIAGNOSIS — Z79890 Hormone replacement therapy (postmenopausal): Secondary | ICD-10-CM | POA: Diagnosis not present

## 2017-05-24 DIAGNOSIS — Z8041 Family history of malignant neoplasm of ovary: Secondary | ICD-10-CM | POA: Diagnosis not present

## 2017-05-24 DIAGNOSIS — Z8249 Family history of ischemic heart disease and other diseases of the circulatory system: Secondary | ICD-10-CM | POA: Diagnosis not present

## 2017-05-24 DIAGNOSIS — E7800 Pure hypercholesterolemia, unspecified: Secondary | ICD-10-CM | POA: Diagnosis present

## 2017-05-24 DIAGNOSIS — E039 Hypothyroidism, unspecified: Secondary | ICD-10-CM | POA: Diagnosis present

## 2017-05-24 DIAGNOSIS — Z9049 Acquired absence of other specified parts of digestive tract: Secondary | ICD-10-CM | POA: Diagnosis not present

## 2017-05-24 DIAGNOSIS — E871 Hypo-osmolality and hyponatremia: Secondary | ICD-10-CM | POA: Diagnosis present

## 2017-05-24 DIAGNOSIS — Z720 Tobacco use: Secondary | ICD-10-CM | POA: Diagnosis present

## 2017-05-24 DIAGNOSIS — E119 Type 2 diabetes mellitus without complications: Secondary | ICD-10-CM | POA: Diagnosis present

## 2017-05-24 DIAGNOSIS — Z833 Family history of diabetes mellitus: Secondary | ICD-10-CM | POA: Diagnosis not present

## 2017-05-24 DIAGNOSIS — A419 Sepsis, unspecified organism: Secondary | ICD-10-CM | POA: Diagnosis present

## 2017-05-24 DIAGNOSIS — G8929 Other chronic pain: Secondary | ICD-10-CM | POA: Diagnosis present

## 2017-05-24 DIAGNOSIS — Z7984 Long term (current) use of oral hypoglycemic drugs: Secondary | ICD-10-CM | POA: Diagnosis not present

## 2017-05-24 DIAGNOSIS — G43909 Migraine, unspecified, not intractable, without status migrainosus: Secondary | ICD-10-CM | POA: Diagnosis present

## 2017-05-24 DIAGNOSIS — Z803 Family history of malignant neoplasm of breast: Secondary | ICD-10-CM | POA: Diagnosis not present

## 2017-05-24 DIAGNOSIS — N179 Acute kidney failure, unspecified: Secondary | ICD-10-CM

## 2017-05-24 DIAGNOSIS — K529 Noninfective gastroenteritis and colitis, unspecified: Secondary | ICD-10-CM | POA: Diagnosis present

## 2017-05-24 DIAGNOSIS — E785 Hyperlipidemia, unspecified: Secondary | ICD-10-CM | POA: Diagnosis present

## 2017-05-24 DIAGNOSIS — Z800 Family history of malignant neoplasm of digestive organs: Secondary | ICD-10-CM | POA: Diagnosis not present

## 2017-05-24 DIAGNOSIS — Z8542 Personal history of malignant neoplasm of other parts of uterus: Secondary | ICD-10-CM | POA: Diagnosis not present

## 2017-05-24 DIAGNOSIS — N390 Urinary tract infection, site not specified: Secondary | ICD-10-CM | POA: Diagnosis present

## 2017-05-24 DIAGNOSIS — I10 Essential (primary) hypertension: Secondary | ICD-10-CM | POA: Diagnosis present

## 2017-05-24 DIAGNOSIS — Z90710 Acquired absence of both cervix and uterus: Secondary | ICD-10-CM | POA: Diagnosis not present

## 2017-05-24 DIAGNOSIS — Z7951 Long term (current) use of inhaled steroids: Secondary | ICD-10-CM | POA: Diagnosis not present

## 2017-05-24 DIAGNOSIS — Z791 Long term (current) use of non-steroidal anti-inflammatories (NSAID): Secondary | ICD-10-CM | POA: Diagnosis not present

## 2017-05-24 DIAGNOSIS — F418 Other specified anxiety disorders: Secondary | ICD-10-CM | POA: Diagnosis present

## 2017-05-24 DIAGNOSIS — F4310 Post-traumatic stress disorder, unspecified: Secondary | ICD-10-CM | POA: Diagnosis present

## 2017-05-24 LAB — BASIC METABOLIC PANEL
Anion gap: 14 (ref 5–15)
BUN: 41 mg/dL — ABNORMAL HIGH (ref 6–20)
CO2: 21 mmol/L — ABNORMAL LOW (ref 22–32)
Calcium: 8.2 mg/dL — ABNORMAL LOW (ref 8.9–10.3)
Chloride: 85 mmol/L — ABNORMAL LOW (ref 101–111)
Creatinine, Ser: 2.67 mg/dL — ABNORMAL HIGH (ref 0.44–1.00)
GFR calc Af Amer: 22 mL/min — ABNORMAL LOW (ref >60)
GFR calc non Af Amer: 19 mL/min — ABNORMAL LOW (ref >60)
Glucose, Bld: 169 mg/dL — ABNORMAL HIGH (ref 65–99)
Potassium: 3.5 mmol/L (ref 3.5–5.1)
Sodium: 120 mmol/L — ABNORMAL LOW (ref 135–145)

## 2017-05-24 LAB — CBC
HCT: 30.8 % — ABNORMAL LOW (ref 36.0–46.0)
Hemoglobin: 11.3 g/dL — ABNORMAL LOW (ref 12.0–15.0)
MCH: 33 pg (ref 26.0–34.0)
MCHC: 36.7 g/dL — ABNORMAL HIGH (ref 30.0–36.0)
MCV: 90.1 fL (ref 78.0–100.0)
Platelets: 212 10*3/uL (ref 150–400)
RBC: 3.42 MIL/uL — ABNORMAL LOW (ref 3.87–5.11)
RDW: 13.2 % (ref 11.5–15.5)
WBC: 15.7 10*3/uL — ABNORMAL HIGH (ref 4.0–10.5)

## 2017-05-24 LAB — CBG MONITORING, ED: Glucose-Capillary: 206 mg/dL — ABNORMAL HIGH (ref 65–99)

## 2017-05-24 LAB — GLUCOSE, CAPILLARY
Glucose-Capillary: 168 mg/dL — ABNORMAL HIGH (ref 65–99)
Glucose-Capillary: 125 mg/dL — ABNORMAL HIGH (ref 65–99)
Glucose-Capillary: 106 mg/dL — ABNORMAL HIGH (ref 65–99)

## 2017-05-24 LAB — CREATININE, URINE, RANDOM: Creatinine, Urine: 48.19 mg/dL

## 2017-05-24 LAB — C DIFFICILE QUICK SCREEN W PCR REFLEX
C Diff antigen: NEGATIVE
C Diff interpretation: NOT DETECTED
C Diff toxin: NEGATIVE

## 2017-05-24 LAB — LACTIC ACID, PLASMA: Lactic Acid, Venous: 1.7 mmol/L (ref 0.5–1.9)

## 2017-05-24 LAB — PROCALCITONIN: Procalcitonin: 6.05 ng/mL

## 2017-05-24 MED ORDER — EZETIMIBE 10 MG PO TABS
10.0000 mg | ORAL_TABLET | Freq: Once | ORAL | Status: AC
  Administered 2017-05-24: 10 mg via ORAL
  Filled 2017-05-24: qty 1

## 2017-05-24 MED ORDER — PSYLLIUM 95 % PO PACK
1.0000 | PACK | Freq: Three times a day (TID) | ORAL | Status: DC
  Administered 2017-05-28: 1 via ORAL
  Filled 2017-05-24 (×17): qty 1

## 2017-05-24 MED ORDER — TOPIRAMATE 25 MG PO TABS
50.0000 mg | ORAL_TABLET | Freq: Every day | ORAL | Status: DC
Start: 2017-05-24 — End: 2017-05-30
  Administered 2017-05-24 – 2017-05-29 (×6): 50 mg via ORAL
  Filled 2017-05-24 (×6): qty 2

## 2017-05-24 MED ORDER — ALBUTEROL SULFATE HFA 108 (90 BASE) MCG/ACT IN AERS: 2.0000 | INHALATION_SPRAY | Freq: Four times a day (QID) | RESPIRATORY_TRACT | Status: DC | PRN

## 2017-05-24 MED ORDER — MULTIVITAMIN PO LIQD: 1.0000 | Freq: Every day | ORAL | Status: DC

## 2017-05-24 MED ORDER — PANTOPRAZOLE SODIUM 40 MG PO TBEC
40.0000 mg | DELAYED_RELEASE_TABLET | Freq: Every day | ORAL | Status: DC
  Administered 2017-05-24 – 2017-05-30 (×7): 40 mg via ORAL
  Filled 2017-05-24 (×7): qty 1

## 2017-05-24 MED ORDER — METHYLCELLULOSE (LAXATIVE) 500 MG PO TABS: 1.0000 | ORAL_TABLET | Freq: Every day | ORAL | Status: DC

## 2017-05-24 MED ORDER — SIMVASTATIN 40 MG PO TABS
40.0000 mg | ORAL_TABLET | Freq: Every evening | ORAL | Status: DC
  Administered 2017-05-24 – 2017-05-29 (×6): 40 mg via ORAL
  Filled 2017-05-24 (×6): qty 1

## 2017-05-24 MED ORDER — FOLIC ACID 1 MG PO TABS
500.0000 ug | ORAL_TABLET | Freq: Every day | ORAL | Status: DC
  Administered 2017-05-25 – 2017-05-29 (×3): 0.5 mg via ORAL
  Filled 2017-05-24 (×6): qty 1

## 2017-05-24 MED ORDER — ESTAZOLAM 2 MG PO TABS: 1.0000 mg | ORAL_TABLET | Freq: Every evening | ORAL | Status: DC | PRN

## 2017-05-24 MED ORDER — INSULIN ASPART 100 UNIT/ML ~~LOC~~ SOLN: 0.0000 [IU] | Freq: Every day | SUBCUTANEOUS | Status: DC

## 2017-05-24 MED ORDER — ENOXAPARIN SODIUM 30 MG/0.3ML ~~LOC~~ SOLN
30.0000 mg | Freq: Every day | SUBCUTANEOUS | Status: DC
  Administered 2017-05-24: 30 mg via SUBCUTANEOUS
  Filled 2017-05-24 (×2): qty 0.3

## 2017-05-24 MED ORDER — EZETIMIBE 10 MG PO TABS
10.0000 mg | ORAL_TABLET | Freq: Every day | ORAL | Status: DC
  Administered 2017-05-25 – 2017-05-30 (×6): 10 mg via ORAL
  Filled 2017-05-24 (×6): qty 1

## 2017-05-24 MED ORDER — VENLAFAXINE HCL ER 150 MG PO CP24
150.0000 mg | ORAL_CAPSULE | Freq: Every day | ORAL | Status: DC
  Administered 2017-05-24: 150 mg via ORAL
  Filled 2017-05-24: qty 2

## 2017-05-24 MED ORDER — CLONAZEPAM 1 MG PO TABS
1.0000 mg | ORAL_TABLET | Freq: Three times a day (TID) | ORAL | Status: DC
  Administered 2017-05-24 – 2017-05-30 (×20): 1 mg via ORAL
  Filled 2017-05-24 (×20): qty 1

## 2017-05-24 MED ORDER — INSULIN ASPART 100 UNIT/ML ~~LOC~~ SOLN
0.0000 [IU] | Freq: Three times a day (TID) | SUBCUTANEOUS | Status: DC
  Administered 2017-05-24: 2 [IU] via SUBCUTANEOUS
  Administered 2017-05-24: 1 [IU] via SUBCUTANEOUS
  Administered 2017-05-25: 2 [IU] via SUBCUTANEOUS
  Administered 2017-05-26: 1 [IU] via SUBCUTANEOUS
  Administered 2017-05-26 – 2017-05-27 (×3): 2 [IU] via SUBCUTANEOUS
  Administered 2017-05-28 (×3): 1 [IU] via SUBCUTANEOUS
  Administered 2017-05-29: 2 [IU] via SUBCUTANEOUS

## 2017-05-24 MED ORDER — LEVOTHYROXINE SODIUM 125 MCG PO TABS
125.0000 ug | ORAL_TABLET | Freq: Every day | ORAL | Status: DC
  Administered 2017-05-24 – 2017-05-30 (×7): 125 ug via ORAL
  Filled 2017-05-24 (×7): qty 1

## 2017-05-24 MED ORDER — PSYLLIUM 58.6 % PO POWD: 1.0000 | Freq: Three times a day (TID) | ORAL | Status: DC

## 2017-05-24 MED ORDER — QUETIAPINE FUMARATE ER 300 MG PO TB24
300.0000 mg | ORAL_TABLET | Freq: Every day | ORAL | Status: DC
  Administered 2017-05-24 – 2017-05-29 (×6): 300 mg via ORAL
  Filled 2017-05-24 (×6): qty 1

## 2017-05-24 MED ORDER — OMEGA-3-ACID ETHYL ESTERS 1 G PO CAPS
1.0000 g | ORAL_CAPSULE | Freq: Every day | ORAL | Status: DC
  Administered 2017-05-25 – 2017-05-26 (×2): 1 g via ORAL
  Filled 2017-05-24 (×5): qty 1

## 2017-05-24 MED ORDER — OMEGA-3 FATTY ACIDS 1000 MG PO CAPS: 1.0000 g | ORAL_CAPSULE | Freq: Every day | ORAL | Status: DC

## 2017-05-24 MED ORDER — ACETAMINOPHEN 325 MG PO TABS: 650.0000 mg | ORAL_TABLET | Freq: Four times a day (QID) | ORAL | Status: DC | PRN

## 2017-05-24 MED ORDER — HYDROMORPHONE HCL 1 MG/ML IJ SOLN
1.0000 mg | INTRAMUSCULAR | Status: DC | PRN
  Administered 2017-05-24 – 2017-05-28 (×7): 1 mg via INTRAVENOUS
  Filled 2017-05-24 (×7): qty 1

## 2017-05-24 MED ORDER — ADULT MULTIVITAMIN W/MINERALS CH
1.0000 | ORAL_TABLET | Freq: Every day | ORAL | Status: DC
  Administered 2017-05-25 – 2017-05-26 (×2): 1 via ORAL
  Filled 2017-05-24 (×5): qty 1

## 2017-05-24 MED ORDER — VENLAFAXINE HCL ER 75 MG PO CP24
75.0000 mg | ORAL_CAPSULE | Freq: Every day | ORAL | Status: DC
  Administered 2017-05-24 – 2017-05-29 (×6): 75 mg via ORAL
  Filled 2017-05-24 (×6): qty 1

## 2017-05-24 MED ORDER — CALCIUM CARBONATE-VITAMIN D 500-200 MG-UNIT PO TABS
1.0000 | ORAL_TABLET | Freq: Every day | ORAL | Status: DC
  Administered 2017-05-25 – 2017-05-26 (×2): 1 via ORAL
  Filled 2017-05-24 (×6): qty 1

## 2017-05-24 MED ORDER — METRONIDAZOLE IN NACL 5-0.79 MG/ML-% IV SOLN
500.0000 mg | Freq: Three times a day (TID) | INTRAVENOUS | Status: DC
  Administered 2017-05-24 – 2017-05-26 (×7): 500 mg via INTRAVENOUS
  Filled 2017-05-24 (×8): qty 100

## 2017-05-24 MED ORDER — HYDROCODONE-ACETAMINOPHEN 10-325 MG PO TABS
1.0000 | ORAL_TABLET | Freq: Four times a day (QID) | ORAL | Status: DC | PRN
Start: 2017-05-24 — End: 2017-05-30
  Administered 2017-05-25 – 2017-05-30 (×16): 1 via ORAL
  Filled 2017-05-24 (×16): qty 1

## 2017-05-24 MED ORDER — METHOCARBAMOL 500 MG PO TABS
500.0000 mg | ORAL_TABLET | Freq: Two times a day (BID) | ORAL | Status: DC
  Administered 2017-05-24 – 2017-05-30 (×12): 500 mg via ORAL
  Filled 2017-05-24 (×12): qty 1

## 2017-05-24 MED ORDER — VITAMIN C 500 MG PO TABS
500.0000 mg | ORAL_TABLET | Freq: Every day | ORAL | Status: DC
  Administered 2017-05-25 – 2017-05-26 (×2): 500 mg via ORAL
  Filled 2017-05-24 (×6): qty 1

## 2017-05-24 MED ORDER — VITAMIN E 45 MG (100 UNIT) PO CAPS
200.0000 [IU] | ORAL_CAPSULE | Freq: Every day | ORAL | Status: DC
  Administered 2017-05-25 – 2017-05-26 (×2): 200 [IU] via ORAL
  Filled 2017-05-24 (×6): qty 2

## 2017-05-24 MED ORDER — ACETAMINOPHEN 650 MG RE SUPP: 650.0000 mg | Freq: Four times a day (QID) | RECTAL | Status: DC | PRN

## 2017-05-24 MED ORDER — HYDRALAZINE HCL 20 MG/ML IJ SOLN: 5.0000 mg | INTRAMUSCULAR | Status: DC | PRN

## 2017-05-24 MED ORDER — TIZANIDINE HCL 4 MG PO TABS
4.0000 mg | ORAL_TABLET | Freq: Every evening | ORAL | Status: DC | PRN
Start: 2017-05-24 — End: 2017-05-30

## 2017-05-24 MED ORDER — FLUTICASONE PROPIONATE 50 MCG/ACT NA SUSP: 1.0000 | Freq: Every day | NASAL | Status: DC | PRN

## 2017-05-24 MED ORDER — ALBUTEROL SULFATE (2.5 MG/3ML) 0.083% IN NEBU: 2.5000 mg | INHALATION_SOLUTION | Freq: Four times a day (QID) | RESPIRATORY_TRACT | Status: DC | PRN

## 2017-05-24 MED ORDER — ONDANSETRON HCL 4 MG/2ML IJ SOLN
4.0000 mg | Freq: Three times a day (TID) | INTRAMUSCULAR | Status: DC | PRN
  Administered 2017-05-24 (×2): 4 mg via INTRAVENOUS
  Filled 2017-05-24 (×2): qty 2

## 2017-05-24 MED ORDER — CIPROFLOXACIN IN D5W 400 MG/200ML IV SOLN
400.0000 mg | INTRAVENOUS | Status: DC
  Administered 2017-05-24: 400 mg via INTRAVENOUS
  Filled 2017-05-24: qty 200

## 2017-05-24 MED ORDER — LEVOTHYROXINE SODIUM 125 MCG PO TABS: 125.0000 ug | ORAL_TABLET | Freq: Once | ORAL | Status: AC

## 2017-05-24 MED ORDER — TEMAZEPAM 15 MG PO CAPS: 15.0000 mg | ORAL_CAPSULE | Freq: Every evening | ORAL | Status: DC | PRN

## 2017-05-24 MED ORDER — SODIUM CHLORIDE 0.9 % IV SOLN
INTRAVENOUS | Status: DC
  Administered 2017-05-24 – 2017-05-28 (×5): via INTRAVENOUS

## 2017-05-24 NOTE — Progress Notes (Addendum)
Pharmacy Antibiotic Note  Alejandra Taylor is a 56 y.o. female with diarrhea admitted on 05/23/2017 with colitis.  Pharmacy has been consulted for cipro dosing.  Plan: Cipro 400 mg IV q24h Flagyl 500 mg IV q8h (MD) F/u scr/cultures Adjusted Lovenox to 30 mg daily for CrCl<30   Height: 5\' 7"  (170.2 cm) Weight: 180 lb (81.6 kg) IBW/kg (Calculated) : 61.6  Temp (24hrs), Avg:98.6 F (37 C), Min:98.6 F (37 C), Max:98.6 F (37 C)  Recent Labs  Lab 05/23/17 2103 05/24/17 0041  WBC 18.8*  --   CREATININE 2.71*  --   LATICACIDVEN  --  1.7    Estimated Creatinine Clearance: 25.5 mL/min (A) (by C-G formula based on SCr of 2.71 mg/dL (H)).    Allergies  Allergen Reactions  . Other Hives    neoprene  . Penicillins Rash    Antimicrobials this admission: 11/30 cipro >>  11/30 flagyl >>   Dose adjustments this admission:   Microbiology results:  BCx:   UCx:    Sputum:    MRSA PCR:   Thank you for allowing pharmacy to be a part of this patient's care.  Dorrene German 05/24/2017 3:26 AM

## 2017-05-24 NOTE — ED Notes (Signed)
Pt. Attempted urine specimen but unsuccessful.

## 2017-05-24 NOTE — Progress Notes (Signed)
Patient ID: Alejandra Taylor, female   DOB: 11-13-1960, 56 y.o.   MRN: [id number]  PROGRESS NOTE    Louine Tenpenny  BFX:[id number] DOB: January 15, 1961 DOA: 05/23/2017  PCP: Esaw Grandchild, NP   Brief Narrative:   56 year old female with history of hypertension, dyslipidemia, diabetes mellitus, hypothyroidism, depression and anxiety, chronic back pain due to spinal stenosis, in the atrial cancer and status post hysterectomy in 2011, no chemotherapy or radiation, history of tobacco abuse. Patient presented with abdominal pain, diarrhea and dysuria for past 3 days prior to this admission, progressively worsening. Pain in the abdomen is in pelvis area, constant, 7 out of 10 in intensity. Patient also reports pain is sharp, nonradiating. She has had associated watery diarrhea on the day of the admission. No fevers or vomiting.  On admission, a shunt was hemodynamically stable. White blood cell count was 18.8. Urinalysis showed moderate leukocytes, sodium was 120, creatinine was 2.71. CT abdomen showed transverse and descending infectious or inflammatory colitis but no evidence of perforation or abscess. Patient was started on empiric Cipro and Flagyl.   Assessment & Plan:   Principal Problem:   Colitis / Leukocytosis - CT scan on the admission showed transverse and descending infectious or inflammatory colitis but no perforation or abscess - Continue Cipro and Flagyl - Continue supportive care with IV fluids - Continue pain management efforts - Patient reports diarrhea resolved -C. difficile is pending   Active Problems:   Diabetes mellitus without complications - Continue sliding scale insulin    Acute kidney injury - Likely related to acute infectious process, colitis and UTI - Continue IV fluids - Follow-up BMP tomorrow morning    Hypothyroidism - TSH pending - Continue synthroid    Dyslipidemia associated with type 2 DM - Continue zetia, fish oil and Zocor    Tobacco abuse -  Counseled on cessation     UTI (urinary tract infection) - Moderate leukocytes seen on urinalysis - Continue Cipro - Follow-up urine culture results    Depression with anxiety - Continue Klonopin and Seroquel   AKI (acute kidney injury) (HCC)    Hyponatremia - Likely due to dehydration - Continue IV fluids - Recheck sodium level in am     DVT prophylaxis: Lovenox subQ Code Status: full code  Family Communication: no family at the bedside  Disposition Plan: not yet stable for discahrge   Consultants:   None  Procedures:   None  Antimicrobials:   Cipro and flagyl 05/23/2017 -->   Subjective: No overnight events.   Objective: Vitals:   05/23/17 2330 05/24/17 0143 05/24/17 0538 05/24/17 0753  BP: (!) 163/88 139/67 (!) 145/66 (!) 142/78  Pulse: (!) 102 (!) 101 91 (!) 105  Resp: 16 20 (!) 21 (!) 25  Temp:      TempSrc:      SpO2: 100% 98% 96% 98%  Weight:      Height:        Intake/Output Summary (Last 24 hours) at 05/24/2017 0834 Last data filed at 05/24/2017 0734 Gross per 24 hour  Intake 1300 ml  Output -  Net 1300 ml   Filed Weights   05/23/17 1514  Weight: 81.6 kg (180 lb)    Examination:  General exam: Appears calm and comfortable  Respiratory system: Clear to auscultation. Respiratory effort normal. Cardiovascular system: S1 & S2 heard, RRR.  Gastrointestinal system: Abdomen is nondistended, soft and nontender. No organomegaly or masses felt. Normal bowel sounds heard. Central nervous system: Alert and oriented. No  focal neurological deficits. Extremities: Symmetric 5 x 5 power. Skin: No rashes, lesions or ulcers Psychiatry: Judgement and insight appear normal. Mood & affect appropriate.   Data Reviewed: I have personally reviewed following labs and imaging studies  CBC: Recent Labs  Lab 05/23/17 2103 05/24/17 0601  WBC 18.8* 15.7*  HGB 13.2 11.3*  HCT 35.1* 30.8*  MCV 89.3 90.1  PLT 253 676   Basic Metabolic Panel: Recent  Labs  Lab 05/23/17 2103 05/24/17 0601  NA 120* 120*  K 3.7 3.5  CL 83* 85*  CO2 21* 21*  GLUCOSE 303* 169*  BUN 40* 41*  CREATININE 2.71* 2.67*  CALCIUM 8.8* 8.2*   GFR: Estimated Creatinine Clearance: 25.9 mL/min (A) (by C-G formula based on SCr of 2.67 mg/dL (H)). Liver Function Tests: Recent Labs  Lab 05/23/17 2103  AST 39  ALT 24  ALKPHOS 141*  BILITOT 0.7  PROT 7.1  ALBUMIN 3.3*   Recent Labs  Lab 05/23/17 2103  LIPASE 17   No results for input(s): AMMONIA in the last 168 hours. Coagulation Profile: No results for input(s): INR, PROTIME in the last 168 hours. Cardiac Enzymes: No results for input(s): CKTOTAL, CKMB, CKMBINDEX, TROPONINI in the last 168 hours. BNP (last 3 results) No results for input(s): PROBNP in the last 8760 hours. HbA1C: No results for input(s): HGBA1C in the last 72 hours. CBG: Recent Labs  Lab 05/23/17 1815 05/23/17 2052 05/24/17 0825  GLUCAP 281* 294* 206*   Lipid Profile: No results for input(s): CHOL, HDL, LDLCALC, TRIG, CHOLHDL, LDLDIRECT in the last 72 hours. Thyroid Function Tests: No results for input(s): TSH, T4TOTAL, FREET4, T3FREE, THYROIDAB in the last 72 hours. Anemia Panel: No results for input(s): VITAMINB12, FOLATE, FERRITIN, TIBC, IRON, RETICCTPCT in the last 72 hours. Urine analysis:    Component Value Date/Time   COLORURINE YELLOW 05/23/2017 2150   APPEARANCEUR CLOUDY (A) 05/23/2017 2150   LABSPEC 1.014 05/23/2017 2150   PHURINE 5.0 05/23/2017 2150   GLUCOSEU 150 (A) 05/23/2017 2150   HGBUR MODERATE (A) 05/23/2017 2150   BILIRUBINUR NEGATIVE 05/23/2017 2150   KETONESUR NEGATIVE 05/23/2017 2150   PROTEINUR NEGATIVE 05/23/2017 2150   NITRITE NEGATIVE 05/23/2017 2150   LEUKOCYTESUR MODERATE (A) 05/23/2017 2150   Sepsis Labs: @LABRCNTIP (procalcitonin:4,lacticidven:4)   )No results found for this or any previous visit (from the past 240 hour(s)).    Radiology Studies: Ct Abdomen Pelvis Wo  Contrast Result Date: 05/23/2017 1. Transverse and descending infectious or inflammatory colitis. No evidence for perforation or abscess. 2. Coronary and aortic calcific atherosclerosis.    Scheduled Meds: . Calcium Carbo  1 tablet Oral Daily  . clonazePAM  1 mg Oral TID  . enoxaparin   30 mg Subcutaneous QHS  . ezetimibe  10 mg Oral Daily  . fish oil-omega-3 fa  1 g Oral Daily  . folic acid  195 mcg Oral Daily  . insulin aspart  0-5 Units Subcutaneous QHS  . insulin aspart  0-9 Units Subcutaneous TID WC  . levothyroxine  125 mcg Oral Daily  . methocarbamol  500 mg Oral BID  . Methylcellulose   1 tablet Oral Daily  . MULTIVITAMIN  1 tablet Oral Daily  . pantoprazole  40 mg Oral Daily  . psyllium  1 packet Oral TID  . QUEtiapine  300 mg Oral QHS  . simvastatin  40 mg Oral QPM  . topiramate  50 mg Oral QHS  . venlafaxine XR  150 mg Oral Q breakfast  . venlafaxine  XR  75 mg Oral QHS  . vitamin C  500 mg Oral Daily  . vitamin E  200 Units Oral Daily   Continuous Infusions: . sodium chloride 125 mL/hr at 05/24/17 0754  . ciprofloxacin    . metronidazole 500 mg (05/24/17 0822)     LOS: 0 days    Time spent: 25 minutes  Greater than 50% of the time spent on counseling and coordinating the care.   Leisa Lenz, MD [facility] Pager [phone]  If 7PM-7AM, please contact night-coverage [email or web address] Password [facility] 05/24/2017, 8:34 AM

## 2017-05-24 NOTE — ED Notes (Signed)
Bed: WA12 Expected date:  Expected time:  Means of arrival:  Comments: Room 1  

## 2017-05-25 ENCOUNTER — Inpatient Hospital Stay (HOSPITAL_COMMUNITY): Payer: BLUE CROSS/BLUE SHIELD

## 2017-05-25 LAB — BASIC METABOLIC PANEL
Anion gap: 9 (ref 5–15)
BUN: 34 mg/dL — ABNORMAL HIGH (ref 6–20)
CO2: 22 mmol/L (ref 22–32)
Calcium: 8 mg/dL — ABNORMAL LOW (ref 8.9–10.3)
Chloride: 96 mmol/L — ABNORMAL LOW (ref 101–111)
Creatinine, Ser: 2.24 mg/dL — ABNORMAL HIGH (ref 0.44–1.00)
GFR calc Af Amer: 27 mL/min — ABNORMAL LOW (ref >60)
GFR calc non Af Amer: 23 mL/min — ABNORMAL LOW (ref >60)
Glucose, Bld: 121 mg/dL — ABNORMAL HIGH (ref 65–99)
Potassium: 3.3 mmol/L — ABNORMAL LOW (ref 3.5–5.1)
Sodium: 127 mmol/L — ABNORMAL LOW (ref 135–145)

## 2017-05-25 LAB — CBC
HCT: 27.3 % — ABNORMAL LOW (ref 36.0–46.0)
Hemoglobin: 9.9 g/dL — ABNORMAL LOW (ref 12.0–15.0)
MCH: 33.1 pg (ref 26.0–34.0)
MCHC: 36.3 g/dL — ABNORMAL HIGH (ref 30.0–36.0)
MCV: 91.3 fL (ref 78.0–100.0)
Platelets: 190 10*3/uL (ref 150–400)
RBC: 2.99 MIL/uL — ABNORMAL LOW (ref 3.87–5.11)
RDW: 13.3 % (ref 11.5–15.5)
WBC: 11.7 10*3/uL — ABNORMAL HIGH (ref 4.0–10.5)

## 2017-05-25 LAB — UREA NITROGEN, URINE: Urea Nitrogen, Ur: 352 mg/dL

## 2017-05-25 LAB — TSH: TSH: 2.977 u[IU]/mL (ref 0.350–4.500)

## 2017-05-25 LAB — GLUCOSE, CAPILLARY
Glucose-Capillary: 117 mg/dL — ABNORMAL HIGH (ref 65–99)
Glucose-Capillary: 112 mg/dL — ABNORMAL HIGH (ref 65–99)
Glucose-Capillary: 153 mg/dL — ABNORMAL HIGH (ref 65–99)
Glucose-Capillary: 111 mg/dL — ABNORMAL HIGH (ref 65–99)

## 2017-05-25 MED ORDER — POTASSIUM CHLORIDE CRYS ER 20 MEQ PO TBCR
40.0000 meq | EXTENDED_RELEASE_TABLET | Freq: Once | ORAL | Status: AC
  Administered 2017-05-25: 40 meq via ORAL
  Filled 2017-05-25: qty 2

## 2017-05-25 MED ORDER — ENOXAPARIN SODIUM 40 MG/0.4ML ~~LOC~~ SOLN
40.0000 mg | Freq: Every day | SUBCUTANEOUS | Status: DC
  Administered 2017-05-25 – 2017-05-29 (×5): 40 mg via SUBCUTANEOUS
  Filled 2017-05-25 (×5): qty 0.4

## 2017-05-25 MED ORDER — CIPROFLOXACIN IN D5W 400 MG/200ML IV SOLN
400.0000 mg | Freq: Two times a day (BID) | INTRAVENOUS | Status: DC
  Administered 2017-05-25 – 2017-05-26 (×2): 400 mg via INTRAVENOUS
  Filled 2017-05-25 (×2): qty 200

## 2017-05-25 NOTE — Progress Notes (Signed)
Pharmacy Antibiotic Note  Alejandra Taylor is a 56 y.o. female with diarrhea admitted on 05/23/2017 with colitis.  Pharmacy has been consulted for cipro dosing.  D#2 Cipro and Flagyl.  WBC improving and afebrile.  AKI resolving, will adjust Cipro dose accordingly.   Plan: Adjust Cipro to 400 mg IV q12h for CrCl>30. Adjusted Lovenox to 40 mg daily for CrCl>30 With resolving AKI, do not anticipate need for further dosage adjustment.  Pharmacy will sign off at this time.  Please reconsult if a change in clinical status warrants re-evaluation of dosage.   Height: 5\' 7"  (170.2 cm) Weight: 180 lb (81.6 kg) IBW/kg (Calculated) : 61.6  Temp (24hrs), Avg:97.7 F (36.5 C), Min:97.5 F (36.4 C), Max:98 F (36.7 C)  Recent Labs  Lab 05/23/17 2103 05/24/17 0041 05/24/17 0358 05/24/17 0500 05/24/17 0601 05/25/17 0903  WBC 18.8*  --   --   --  15.7* 11.7*  CREATININE 2.71*  --   --  QUESTIONABLE RESULTS, RECOMMEND RECOLLECT TO VERIFY 2.67* 2.24*  LATICACIDVEN  --  1.7 QUESTIONABLE RESULTS, RECOMMEND RECOLLECT TO VERIFY  --   --   --     Estimated Creatinine Clearance: 30.8 mL/min (A) (by C-G formula based on SCr of 2.24 mg/dL (H)).    Allergies  Allergen Reactions  . Other Hives    neoprene  . Penicillins Rash    Antimicrobials this admission: 11/30 flagyl >>  11/30 cipro >>   Dose adjustments this admission:  Microbiology results: 11/30 BCx: ngtd 11/30 UCx: 40K GNR 11/30 CDiff PCR: neg  Thank you for allowing pharmacy to be a part of this patient's care.  Hershal Coria 05/25/2017 12:40 PM

## 2017-05-25 NOTE — Progress Notes (Signed)
Patient ID: Alejandra Taylor, female   DOB: May 19, 1961, 56 y.o.   MRN: [id number]  PROGRESS NOTE    Alejandra Taylor  WFU:[id number] DOB: 1960/08/29 DOA: 05/23/2017  PCP: Esaw Grandchild, NP   Brief Narrative:   56 year old female with history of hypertension, dyslipidemia, diabetes mellitus, hypothyroidism, depression and anxiety, chronic back pain due to spinal stenosis, in the atrial cancer and status post hysterectomy in 2011, no chemotherapy or radiation, history of tobacco abuse. Patient presented with abdominal pain, diarrhea and dysuria for past 3 days prior to this admission, progressively worsening. Pain in the abdomen is in pelvis area, constant, 7 out of 10 in intensity. Patient also reports pain is sharp, nonradiating. She has had associated watery diarrhea on the day of the admission. No fevers or vomiting.  On admission, a shunt was hemodynamically stable. White blood cell count was 18.8. Urinalysis showed moderate leukocytes, sodium was 120, creatinine was 2.71. CT abdomen showed transverse and descending infectious or inflammatory colitis but no evidence of perforation or abscess. Patient was started on empiric Cipro and Flagyl.   Assessment & Plan:   Principal Problem:   Colitis / Leukocytosis - CT scan on the admission showed transverse and descending infectious or inflammatory colitis but no perforation or abscess - Continue Cipro and Flagyl for now - Patient says diarrhea resolved - C. difficile is negative - Continue supportive care with fluids   Active Problems:   Diabetes mellitus without complications - Continue sliding scale insulin - CBGs in past 24 hours: 125, 106, 117    Acute kidney injury - Likely related to acute infectious process, colitis and UTI - Cr slowly trending down with IV fluids - Follow up BMP in am    Hypothyroidism - TSH pending - Continue synthroid     Dyslipidemia associated with type 2 DM - Continue Zocor, Zetia and fish oil   Tobacco abuse - Counseled on smoking cessation     UTI (urinary tract infection) - Moderate leukocytes seen on urinalysis - Continue cipro - Urine cx pending     Depression with anxiety - Continue Klonopin and Seroquel - Stable     Hyponatremia - Likely due to dehydration - Sodium 127 this am, improving     Hypokalemia - Due to GI losses - Supplemented  - Follow up BMP in am    Right side rib cage pain - CXR ordered     Falls - Pt reported occasional falls - PT eval ordered   DVT prophylaxis: Lovenox subQ Code Status: full code  Family Communication: no family at the bedside  Disposition Plan: follow up daily BMP   Consultants:   PT  Procedures:   None   Antimicrobials:   Cipro and flagyl 05/23/2017 -->   Subjective: No overnight events.   Objective: Vitals:   05/24/17 1130 05/24/17 1251 05/24/17 2106 05/25/17 0630  BP: 127/67 138/63 117/61 (!) 119/50  Pulse: 89 85 76 91  Resp: 18 18 18 18   Temp:  97.6 F (36.4 C) 98 F (36.7 C) (!) 97.5 F (36.4 C)  TempSrc:  Oral Oral Oral  SpO2: 96% 98% 98% 100%  Weight:      Height:        Intake/Output Summary (Last 24 hours) at 05/25/2017 1012 Last data filed at 05/25/2017 0844 Gross per 24 hour  Intake 3262.5 ml  Output -  Net 3262.5 ml   Filed Weights   05/23/17 1514  Weight: 81.6 kg (180 lb)  Physical Exam  Constitutional: Appears well-developed and well-nourished. No distress.  CVS: RRR, S1/S2 + Pulmonary: Effort and breath sounds normal, no stridor, rhonchi, wheezes, rales.  Abdominal: Soft. BS +,  no distension, tenderness, rebound or guarding.  Musculoskeletal: Normal range of motion. No edema and no tenderness.  Lymphadenopathy: No lymphadenopathy noted, cervical, inguinal. Neuro: Alert. Normal reflexes, muscle tone coordination. No cranial nerve deficit. Skin: Skin is warm and dry. No rash noted. Not diaphoretic. No erythema. No pallor.  Psychiatric: Normal mood and affect.  Behavior, judgment, thought content normal.   Examination:  General exam: Appears calm and comfortable  Respiratory system: Clear to auscultation. Respiratory effort normal. Cardiovascular system: S1 & S2 heard, RRR.  Gastrointestinal system: Abdomen is nondistended, soft and nontender. No organomegaly or masses felt. Normal bowel sounds heard. Central nervous system: Alert and oriented. No focal neurological deficits. Extremities: Symmetric 5 x 5 power. Skin: No rashes, lesions or ulcers Psychiatry: Judgement and insight appear normal. Mood & affect appropriate.   Data Reviewed: I have personally reviewed following labs and imaging studies  CBC: Recent Labs  Lab 05/23/17 2103 05/24/17 0601 05/25/17 0903  WBC 18.8* 15.7* 11.7*  HGB 13.2 11.3* 9.9*  HCT 35.1* 30.8* 27.3*  MCV 89.3 90.1 91.3  PLT 253 212 622   Basic Metabolic Panel: Recent Labs  Lab 05/23/17 2103 05/24/17 0601  NA 120* 120*  K 3.7 3.5  CL 83* 85*  CO2 21* 21*  GLUCOSE 303* 169*  BUN 40* 41*  CREATININE 2.71* 2.67*  CALCIUM 8.8* 8.2*   GFR: Estimated Creatinine Clearance: 30.8 mL/min (A) (by C-G formula based on SCr of 2.24 mg/dL (H)). Liver Function Tests: Recent Labs  Lab 05/23/17 2103  AST 39  ALT 24  ALKPHOS 141*  BILITOT 0.7  PROT 7.1  ALBUMIN 3.3*   Recent Labs  Lab 05/23/17 2103  LIPASE 17   No results for input(s): AMMONIA in the last 168 hours. Coagulation Profile: No results for input(s): INR, PROTIME in the last 168 hours. Cardiac Enzymes: No results for input(s): CKTOTAL, CKMB, CKMBINDEX, TROPONINI in the last 168 hours. BNP (last 3 results) No results for input(s): PROBNP in the last 8760 hours. HbA1C: No results for input(s): HGBA1C in the last 72 hours. CBG: Recent Labs  Lab 05/24/17 0825 05/24/17 1257 05/24/17 1633 05/24/17 2108 05/25/17 0736  GLUCAP 206* 168* 125* 106* 117*   Lipid Profile: No results for input(s): CHOL, HDL, LDLCALC, TRIG, CHOLHDL,  LDLDIRECT in the last 72 hours. Thyroid Function Tests: No results for input(s): TSH, T4TOTAL, FREET4, T3FREE, THYROIDAB in the last 72 hours. Anemia Panel: No results for input(s): VITAMINB12, FOLATE, FERRITIN, TIBC, IRON, RETICCTPCT in the last 72 hours. Urine analysis:    Component Value Date/Time   COLORURINE YELLOW 05/23/2017 2150   APPEARANCEUR CLOUDY (A) 05/23/2017 2150   LABSPEC 1.014 05/23/2017 2150   PHURINE 5.0 05/23/2017 2150   GLUCOSEU 150 (A) 05/23/2017 2150   HGBUR MODERATE (A) 05/23/2017 2150   BILIRUBINUR NEGATIVE 05/23/2017 2150   KETONESUR NEGATIVE 05/23/2017 2150   PROTEINUR NEGATIVE 05/23/2017 2150   NITRITE NEGATIVE 05/23/2017 2150   LEUKOCYTESUR MODERATE (A) 05/23/2017 2150   Sepsis Labs: @LABRCNTIP (procalcitonin:4,lacticidven:4)   ) Recent Results (from the past 240 hour(s))  Urine Culture     Status: Abnormal (Preliminary result)   Collection Time: 05/24/17  5:30 AM  Result Value Ref Range Status   Specimen Description URINE, CLEAN CATCH  Final   Special Requests NONE  Final   Culture  40,000 COLONIES/mL GRAM NEGATIVE RODS (A)  Final   Report Status PENDING  Incomplete  C difficile quick scan w PCR reflex     Status: None   Collection Time: 05/24/17  7:58 AM  Result Value Ref Range Status   C Diff antigen NEGATIVE NEGATIVE Final   C Diff toxin NEGATIVE NEGATIVE Final   C Diff interpretation No C. difficile detected.  Final      Radiology Studies: Ct Abdomen Pelvis Wo Contrast Result Date: 05/23/2017 1. Transverse and descending infectious or inflammatory colitis. No evidence for perforation or abscess. 2. Coronary and aortic calcific atherosclerosis.    Scheduled Meds: . Calcium Carbo  1 tablet Oral Daily  . clonazePAM  1 mg Oral TID  . enoxaparin   30 mg Subcutaneous QHS  . ezetimibe  10 mg Oral Daily  . fish oil-omega-3 fa  1 g Oral Daily  . folic acid  128 mcg Oral Daily  . insulin aspart  0-5 Units Subcutaneous QHS  . insulin aspart   0-9 Units Subcutaneous TID WC  . levothyroxine  125 mcg Oral Daily  . methocarbamol  500 mg Oral BID  . Methylcellulose   1 tablet Oral Daily  . MULTIVITAMIN  1 tablet Oral Daily  . pantoprazole  40 mg Oral Daily  . psyllium  1 packet Oral TID  . QUEtiapine  300 mg Oral QHS  . simvastatin  40 mg Oral QPM  . topiramate  50 mg Oral QHS  . venlafaxine XR  150 mg Oral Q breakfast  . venlafaxine XR  75 mg Oral QHS  . vitamin C  500 mg Oral Daily  . vitamin E  200 Units Oral Daily   Continuous Infusions: . sodium chloride 125 mL/hr at 05/24/17 1604  . ciprofloxacin 400 mg (05/24/17 2208)  . metronidazole 500 mg (05/25/17 0805)     LOS: 1 day    Time spent: 25 minutes  Greater than 50% of the time spent on counseling and coordinating the care.   Leisa Lenz, MD [facility] Pager [phone]  If 7PM-7AM, please contact night-coverage [email or web address] Password [email or web address] 05/25/2017, 10:12 AM

## 2017-05-26 LAB — BASIC METABOLIC PANEL
Anion gap: 9 (ref 5–15)
BUN: 25 mg/dL — ABNORMAL HIGH (ref 6–20)
CO2: 19 mmol/L — ABNORMAL LOW (ref 22–32)
Calcium: 8 mg/dL — ABNORMAL LOW (ref 8.9–10.3)
Chloride: 102 mmol/L (ref 101–111)
Creatinine, Ser: 1.85 mg/dL — ABNORMAL HIGH (ref 0.44–1.00)
GFR calc Af Amer: 34 mL/min — ABNORMAL LOW (ref >60)
GFR calc non Af Amer: 29 mL/min — ABNORMAL LOW (ref >60)
Glucose, Bld: 114 mg/dL — ABNORMAL HIGH (ref 65–99)
Potassium: 3.5 mmol/L (ref 3.5–5.1)
Sodium: 130 mmol/L — ABNORMAL LOW (ref 135–145)

## 2017-05-26 LAB — CBC
HCT: 28.4 % — ABNORMAL LOW (ref 36.0–46.0)
Hemoglobin: 9.9 g/dL — ABNORMAL LOW (ref 12.0–15.0)
MCH: 32.5 pg (ref 26.0–34.0)
MCHC: 34.9 g/dL (ref 30.0–36.0)
MCV: 93.1 fL (ref 78.0–100.0)
Platelets: 222 10*3/uL (ref 150–400)
RBC: 3.05 MIL/uL — ABNORMAL LOW (ref 3.87–5.11)
RDW: 13.2 % (ref 11.5–15.5)
WBC: 11 10*3/uL — ABNORMAL HIGH (ref 4.0–10.5)

## 2017-05-26 LAB — GLUCOSE, CAPILLARY
Glucose-Capillary: 138 mg/dL — ABNORMAL HIGH (ref 65–99)
Glucose-Capillary: 157 mg/dL — ABNORMAL HIGH (ref 65–99)
Glucose-Capillary: 92 mg/dL (ref 65–99)
Glucose-Capillary: 156 mg/dL — ABNORMAL HIGH (ref 65–99)

## 2017-05-26 LAB — HIV ANTIBODY (ROUTINE TESTING W REFLEX): HIV Screen 4th Generation wRfx: NONREACTIVE

## 2017-05-26 MED ORDER — CIPROFLOXACIN HCL 500 MG PO TABS
500.0000 mg | ORAL_TABLET | Freq: Two times a day (BID) | ORAL | Status: DC
  Administered 2017-05-26 – 2017-05-30 (×8): 500 mg via ORAL
  Filled 2017-05-26 (×8): qty 1

## 2017-05-26 MED ORDER — BACID PO TABS
2.0000 | ORAL_TABLET | Freq: Three times a day (TID) | ORAL | Status: DC
  Administered 2017-05-26 (×2): 2 via ORAL
  Filled 2017-05-26 (×5): qty 2

## 2017-05-26 MED ORDER — METRONIDAZOLE 500 MG PO TABS
500.0000 mg | ORAL_TABLET | Freq: Three times a day (TID) | ORAL | Status: DC
  Administered 2017-05-26 – 2017-05-30 (×13): 500 mg via ORAL
  Filled 2017-05-26 (×14): qty 1

## 2017-05-26 NOTE — Evaluation (Signed)
Physical Therapy Evaluation Patient Details Name: Alejandra Taylor MRN: [id number] DOB: 02-08-1961 Today's Date: 05/26/2017   History of Present Illness  56 yo female admitted with colitis, sepsis. Hx of DM, HTN, PTSD, falls, spinal stenosis, chronic back pain, endometrial cancer.     Clinical Impression  On eval, pt was Min guard assist for mobility. She walked ~200 feet while holding on to IV pole. Pt denied lightheadedness. She tolerated distance well. Discussed d/c plan-pt will return to her mother's home where she has been staying for the last few weeks. Pt politely declines HHPT f/u. Will follow and progress activity as tolerated. Encouraged pt to ambulate with nursing as able.     Follow Up Recommendations No PT follow up;Supervision - Intermittent    Equipment Recommendations  None recommended by PT    Recommendations for Other Services       Precautions / Restrictions Precautions Precautions: Fall Restrictions Weight Bearing Restrictions: No      Mobility  Bed Mobility Overal bed mobility: Modified Independent                Transfers Overall transfer level: Modified independent                  Ambulation/Gait Ambulation/Gait assistance: Min guard Ambulation Distance (Feet): 200 Feet Assistive device: (IV pole) Gait Pattern/deviations: Step-through pattern     General Gait Details: slow gait speed. Pt held on to IV pole.   Stairs            Wheelchair Mobility    Modified Rankin (Stroke Patients Only)       Balance Overall balance assessment: Needs assistance;History of Falls           Standing balance-Leahy Scale: Fair                               Pertinent Vitals/Pain Pain Assessment: Faces Faces Pain Scale: Hurts little more Pain Location: back  Pain Descriptors / Indicators: Aching;Sore Pain Intervention(s): Monitored during session;Repositioned    Home Living Family/patient expects to be discharged  to:: Private residence Living Arrangements: Spouse/significant other Available Help at Discharge: Family(staying at Mom's) Type of Home: House Home Access: Stairs to enter   Technical brewer of Steps: 2 Home Layout: One level Home Equipment: Cane - single point      Prior Function Level of Independence: Independent with assistive device(s)         Comments: uses cane PRN for knee and back pain     Hand Dominance        Extremity/Trunk Assessment   Upper Extremity Assessment Upper Extremity Assessment: Generalized weakness    Lower Extremity Assessment Lower Extremity Assessment: Generalized weakness    Cervical / Trunk Assessment Cervical / Trunk Assessment: Normal  Communication   Communication: No difficulties  Cognition Arousal/Alertness: Awake/alert Behavior During Therapy: WFL for tasks assessed/performed Overall Cognitive Status: Within Functional Limits for tasks assessed                                        General Comments      Exercises     Assessment/Plan    PT Assessment Patient needs continued PT services  PT Problem List Decreased mobility;Decreased activity tolerance;Decreased balance;Pain       PT Treatment Interventions DME instruction;Gait training;Therapeutic activities;Therapeutic exercise;Patient/family education;Functional mobility training;Balance training  PT Goals (Current goals can be found in the Care Plan section)  Acute Rehab PT Goals Patient Stated Goal: to continue getting better. no more diarrhea PT Goal Formulation: With patient Time For Goal Achievement: 06/09/17 Potential to Achieve Goals: Good    Frequency Min 3X/week   Barriers to discharge        Co-evaluation               AM-PAC PT "6 Clicks" Daily Activity  Outcome Measure Difficulty turning over in bed (including adjusting bedclothes, sheets and blankets)?: None Difficulty moving from lying on back to sitting on the  side of the bed? : None Difficulty sitting down on and standing up from a chair with arms (e.g., wheelchair, bedside commode, etc,.)?: None Help needed moving to and from a bed to chair (including a wheelchair)?: A Little Help needed walking in hospital room?: A Little Help needed climbing 3-5 steps with a railing? : A Little 6 Click Score: 21    End of Session Equipment Utilized During Treatment: Gait belt Activity Tolerance: Patient tolerated treatment well Patient left: in bed;with call bell/phone within reach;with family/visitor present   PT Visit Diagnosis: Muscle weakness (generalized) (M62.81);Difficulty in walking, not elsewhere classified (R26.2)    Time: 5320-2334 PT Time Calculation (min) (ACUTE ONLY): 26 min   Charges:   PT Evaluation $PT Eval Moderate Complexity: 1 Mod     PT G Codes:          Weston Anna, MPT Pager: [phone]

## 2017-05-26 NOTE — Progress Notes (Signed)
PROGRESS NOTE    Patient: Alejandra Taylor     PCP: Mina Marble D, NP                    DOB: 1961/01/15            DOA: 05/23/2017 [id number]             DOS: 05/26/2017, 11:45 AM  Date of Service: the patient was seen and examined on 05/26/2017  ---------------------------------------------------------------------------------------------------------------------- Brief Narrative: [facility] course of stay) 56 year old female with history of hypertension, dyslipidemia, diabetes mellitus, hypothyroidism, depression and anxiety, chronic back pain due to spinal stenosis, in the atrial cancer and status post hysterectomy in 2011, no chemotherapy or radiation, history of tobacco abuse. Patient presented with abdominal pain, diarrhea and dysuria for past 3 days prior to this admission, progressively worsening. Pain in the abdomen is in pelvis area, constant, 7 out of 10 in intensity. Patient also reports pain is sharp, nonradiating. She has had associated watery diarrhea on the day of the admission. No fevers or vomiting.  On admission, a shunt was hemodynamically stable. White blood cell count was 18.8. Urinalysis showed moderate leukocytes, sodium was 120, creatinine was 2.71. CT abdomen showed transverse and descending infectious or inflammatory colitis but no evidence of perforation or abscess. Patient was started on empiric Cipro and Flagyl. -------------------------------------------------------------------------------------------------------------------------------------------------------------------------  Subjective: Patient was seen and examined this a.m., stable, no acute issues overnight, poor patient and nurse she has been tolerating some clears.  Patient reports nausea vomiting abdominal pain and diarrhea has improved.    Assessment & Plan:  Principal Problem:   Colitis Active Problems:   Essential hypertension   Hypothyroidism   Hyperlipidemia   Tobacco abuse   UTI (urinary  tract infection)   Depression with anxiety   AKI (acute kidney injury) ([place])   Hyponatremia  Colitis / Leukocytosis - CT scan on the admission showed transverse and descending infectious or inflammatory colitis but no perforation or abscess - Continue Cipro and Flagyl, switch from IV to p.o., - Patient says diarrhea resolved - C. difficile is negative - Continue supportive care with fluids   Active Problems:   Diabetes mellitus without complications - Continue sliding scale insulin We will check the blood sugar before every meal CHS    Acute kidney injury - Likely related to acute infectious process, colitis and UTI - Cr slowly trending down with IV fluids, BUN/creatinine improving -Avoid nephrotoxins - Follow up BMP    Hypothyroidism - TSH normal at 2.977 - Continue synthroid     Dyslipidemia associated with type 2 DM - Continue Zocor, Zetia and fish oil    Tobacco abuse - Counseled on smoking cessation     UTI (urinary tract infection) - Moderate leukocytes seen on urinalysis - Continue cipro - Urine cx negative to date    Depression with anxiety - Continue Klonopin and Seroquel - Stable     Hyponatremia - Likely due to dehydration, proving - Sodium 130 this am,    Hypokalemia -Improved, monitoring and bleeding according to    Right side rib cage pain - CXR within normal limits    Falls - Pt reported occasional falls - PT eval ordered   DVT prophylaxis: Lovenox subQ Code Status: full code  Family Communication: no family at the bedside  Disposition Plan: follow up daily BMP   Code Status:         Full code    Family Communication:  The above findings and plan  of care has been discussed with patient and she  expressed understanding and agreement of above.   Consultants: none   Procedures:  No admission procedures for hospital encounter.   Antimicrobials:  Anti-infectives (From admission, onward)   Start     Dose/Rate Route  Frequency Ordered Stop   05/26/17 1400  metroNIDAZOLE (FLAGYL) tablet 500 mg     500 mg Oral Every 8 hours 05/26/17 1144     05/26/17 1145  ciprofloxacin (CIPRO) tablet 500 mg     500 mg Oral 2 times daily 05/26/17 1144     05/25/17 2200  ciprofloxacin (CIPRO) IVPB 400 mg  Status:  Discontinued     400 mg 200 mL/hr over 60 Minutes Intravenous Every 12 hours 05/25/17 1239 05/26/17 1144   05/24/17 2200  ciprofloxacin (CIPRO) IVPB 400 mg  Status:  Discontinued     400 mg 200 mL/hr over 60 Minutes Intravenous Every 24 hours 05/24/17 0330 05/25/17 1239   05/24/17 0800  metroNIDAZOLE (FLAGYL) IVPB 500 mg  Status:  Discontinued     500 mg 100 mL/hr over 60 Minutes Intravenous Every 8 hours 05/24/17 0218 05/26/17 1144   05/23/17 2300  ciprofloxacin (CIPRO) IVPB 400 mg     400 mg 200 mL/hr over 60 Minutes Intravenous  Once 05/23/17 2255 05/24/17 0024   05/23/17 2300  metroNIDAZOLE (FLAGYL) IVPB 500 mg     500 mg 100 mL/hr over 60 Minutes Intravenous  Once 05/23/17 2255 05/24/17 0140        Objective: Vitals:   05/25/17 0630 05/25/17 1408 05/25/17 2119 05/26/17 0608  BP: (!) 119/50 (!) 109/48 (!) 128/54 (!) 128/59  Pulse: 91 96 98 (!) 103  Resp: 18 18 18    Temp: (!) 97.5 F (36.4 C) 97.8 F (36.6 C) 98.4 F (36.9 C) 98.6 F (37 C)  TempSrc: Oral Oral Oral Oral  SpO2: 100% 100% 98% 99%  Weight:      Height:        Intake/Output Summary (Last 24 hours) at 05/26/2017 1145 Last data filed at 05/26/2017 0834 Gross per 24 hour  Intake 4335 ml  Output -  Net 4335 ml   Filed Weights   05/23/17 1514  Weight: 81.6 kg (180 lb)    Examination:  General exam: Appears calm and comfortable  Respiratory system: Clear to auscultation. Respiratory effort normal. Cardiovascular system: S1 & S2 heard, RRR. No JVD, murmurs, rubs, gallops or clicks. No pedal edema. Gastrointestinal system: Abdomen is nondistended, soft and nontender. No organomegaly or masses felt. Normal bowel sounds  heard. Central nervous system: Alert and oriented. No focal neurological deficits. Extremities: Symmetric 5 x 5 power. Skin: No rashes, lesions or ulcers Psychiatry: Judgement and insight appear normal. Mood & affect appropriate.     Data Reviewed: I have personally reviewed following labs and imaging studies  CBC: Recent Labs  Lab 05/23/17 2103 05/24/17 0601 05/25/17 0903 05/26/17 0622  WBC 18.8* 15.7* 11.7* 11.0*  HGB 13.2 11.3* 9.9* 9.9*  HCT 35.1* 30.8* 27.3* 28.4*  MCV 89.3 90.1 91.3 93.1  PLT 253 212 190 287   Basic Metabolic Panel: Recent Labs  Lab 05/23/17 2103 05/24/17 0500 05/24/17 0601 05/25/17 0903 05/26/17 0622  NA 120* QUESTIONABLE RESULTS, RECOMMEND RECOLLECT TO VERIFY 120* 127* 130*  K 3.7 QUESTIONABLE RESULTS, RECOMMEND RECOLLECT TO VERIFY 3.5 3.3* 3.5  CL 83* QUESTIONABLE RESULTS, RECOMMEND RECOLLECT TO VERIFY 85* 96* 102  CO2 21* QUESTIONABLE RESULTS, RECOMMEND RECOLLECT TO VERIFY 21* 22 19*  GLUCOSE  303* QUESTIONABLE RESULTS, RECOMMEND RECOLLECT TO VERIFY 169* 121* 114*  BUN 40* QUESTIONABLE RESULTS, RECOMMEND RECOLLECT TO VERIFY 41* 34* 25*  CREATININE 2.71* QUESTIONABLE RESULTS, RECOMMEND RECOLLECT TO VERIFY 2.67* 2.24* 1.85*  CALCIUM 8.8* QUESTIONABLE RESULTS, RECOMMEND RECOLLECT TO VERIFY 8.2* 8.0* 8.0*   GFR: Estimated Creatinine Clearance: 37.3 mL/min (A) (by C-G formula based on SCr of 1.85 mg/dL (H)). Liver Function Tests: Recent Labs  Lab 05/23/17 2103  AST 39  ALT 24  ALKPHOS 141*  BILITOT 0.7  PROT 7.1  ALBUMIN 3.3*   Recent Labs  Lab 05/23/17 2103  LIPASE 17   No results for input(s): AMMONIA in the last 168 hours. Coagulation Profile: No results for input(s): INR, PROTIME in the last 168 hours. Cardiac Enzymes: No results for input(s): CKTOTAL, CKMB, CKMBINDEX, TROPONINI in the last 168 hours. BNP (last 3 results) No results for input(s): PROBNP in the last 8760 hours. HbA1C: No results for input(s): HGBA1C in the last  72 hours. CBG: Recent Labs  Lab 05/25/17 1127 05/25/17 1659 05/25/17 2123 05/26/17 0748 05/26/17 1131  GLUCAP 112* 153* 111* 138* 157*   Lipid Profile: No results for input(s): CHOL, HDL, LDLCALC, TRIG, CHOLHDL, LDLDIRECT in the last 72 hours. Thyroid Function Tests: Recent Labs    05/25/17 0903  TSH 2.977   Anemia Panel: No results for input(s): VITAMINB12, FOLATE, FERRITIN, TIBC, IRON, RETICCTPCT in the last 72 hours. Sepsis Labs: Recent Labs  Lab 05/24/17 0041 05/24/17 0358  PROCALCITON 6.05  --   LATICACIDVEN 1.7 QUESTIONABLE RESULTS, RECOMMEND RECOLLECT TO VERIFY    Recent Results (from the past 240 hour(s))  Culture, blood (x 2)     Status: None (Preliminary result)   Collection Time: 05/24/17 12:26 AM  Result Value Ref Range Status   Specimen Description BLOOD LEFT WRIST  Final   Special Requests   Final    BOTTLES DRAWN AEROBIC AND ANAEROBIC Blood Culture adequate volume   Culture   Final    NO GROWTH 1 DAY Performed at [facility], [place] [address], [place], [place] [address]    Report Status PENDING  Incomplete  Culture, blood (x 2)     Status: None (Preliminary result)   Collection Time: 05/24/17 12:41 AM  Result Value Ref Range Status   Specimen Description BLOOD LEFT ANTECUBITAL  Final   Special Requests IN PEDIATRIC BOTTLE Blood Culture adequate volume  Final   Culture   Final    NO GROWTH 1 DAY Performed at [facility], [place] [address] [place], [place] [address]    Report Status PENDING  Incomplete  Urine Culture     Status: Abnormal (Preliminary result)   Collection Time: 05/24/17  5:30 AM  Result Value Ref Range Status   Specimen Description URINE, CLEAN CATCH  Final   Special Requests NONE  Final   Culture (A)  Final    40,000 COLONIES/mL ESCHERICHIA COLI REPEATING SENSITIVITIES Performed at [facility], [place] [address], [place], [place] [address]    Report Status PENDING  Incomplete  C difficile quick scan w PCR  reflex     Status: None   Collection Time: 05/24/17  7:58 AM  Result Value Ref Range Status   C Diff antigen NEGATIVE NEGATIVE Final   C Diff toxin NEGATIVE NEGATIVE Final   C Diff interpretation No C. difficile detected.  Final       Radiology Studies: Dg Chest Port 1 View  Result Date: 05/25/2017 CLINICAL DATA:  Fall EXAM: PORTABLE CHEST 1 VIEW COMPARISON:  01/30/2017 FINDINGS: Low lung volumes. Heart and mediastinal contours are within normal limits. No focal opacities or effusions. No acute bony abnormality. IMPRESSION: No active cardiopulmonary disease. Electronically Signed   By: Rolm Baptise M.D.   On: 05/25/2017 09:02    Scheduled Meds: . calcium-vitamin D  1 tablet Oral Daily  . ciprofloxacin  500 mg Oral BID  . clonazePAM  1 mg Oral TID  . enoxaparin (LOVENOX) injection  40 mg Subcutaneous QHS  . ezetimibe  10 mg Oral Daily  . folic acid  254 mcg Oral Daily  . insulin aspart  0-5 Units Subcutaneous QHS  . insulin aspart  0-9 Units Subcutaneous TID WC  . lactobacillus acidophilus  2 tablet Oral TID  . levothyroxine  125 mcg Oral QAC breakfast  . methocarbamol  500 mg Oral BID  . metroNIDAZOLE  500 mg Oral Q8H  . multivitamin with minerals  1 tablet Oral Daily  . omega-3 acid ethyl esters  1 g Oral Daily  . pantoprazole  40 mg Oral Daily  . psyllium  1 packet Oral TID  . QUEtiapine  300 mg Oral QHS  . simvastatin  40 mg Oral QPM  . topiramate  50 mg Oral QHS  . venlafaxine XR  75 mg Oral QHS  . vitamin C  500 mg Oral Daily  . vitamin E  200 Units Oral Daily   Continuous Infusions: . sodium chloride 125 mL/hr at 05/24/17 1604     LOS: 2 days    Time spent: 25   Deatra James, MD [facility] Pager [phone]  If 7PM-7AM, please contact night-coverage [email or web address] Password [email or web address] 05/26/2017, 11:45 AM

## 2017-05-27 LAB — BASIC METABOLIC PANEL
Anion gap: 9 (ref 5–15)
BUN: 16 mg/dL (ref 6–20)
CO2: 19 mmol/L — ABNORMAL LOW (ref 22–32)
Calcium: 8 mg/dL — ABNORMAL LOW (ref 8.9–10.3)
Chloride: 106 mmol/L (ref 101–111)
Creatinine, Ser: 1.46 mg/dL — ABNORMAL HIGH (ref 0.44–1.00)
GFR calc Af Amer: 45 mL/min — ABNORMAL LOW (ref >60)
GFR calc non Af Amer: 39 mL/min — ABNORMAL LOW (ref >60)
Glucose, Bld: 129 mg/dL — ABNORMAL HIGH (ref 65–99)
Potassium: 3.2 mmol/L — ABNORMAL LOW (ref 3.5–5.1)
Sodium: 134 mmol/L — ABNORMAL LOW (ref 135–145)

## 2017-05-27 LAB — CBC
HCT: 29.3 % — ABNORMAL LOW (ref 36.0–46.0)
Hemoglobin: 10.3 g/dL — ABNORMAL LOW (ref 12.0–15.0)
MCH: 32.7 pg (ref 26.0–34.0)
MCHC: 35.2 g/dL (ref 30.0–36.0)
MCV: 93 fL (ref 78.0–100.0)
Platelets: 216 10*3/uL (ref 150–400)
RBC: 3.15 MIL/uL — ABNORMAL LOW (ref 3.87–5.11)
RDW: 13.5 % (ref 11.5–15.5)
WBC: 13.8 10*3/uL — ABNORMAL HIGH (ref 4.0–10.5)

## 2017-05-27 LAB — GLUCOSE, CAPILLARY
Glucose-Capillary: 134 mg/dL — ABNORMAL HIGH (ref 65–99)
Glucose-Capillary: 98 mg/dL (ref 65–99)
Glucose-Capillary: 193 mg/dL — ABNORMAL HIGH (ref 65–99)

## 2017-05-27 LAB — URINE CULTURE: Culture: 40000 — AB

## 2017-05-27 LAB — MAGNESIUM: Magnesium: 1.4 mg/dL — ABNORMAL LOW (ref 1.7–2.4)

## 2017-05-27 MED ORDER — RISAQUAD PO CAPS
2.0000 | ORAL_CAPSULE | Freq: Three times a day (TID) | ORAL | Status: DC
  Administered 2017-05-27 – 2017-05-30 (×10): 2 via ORAL
  Filled 2017-05-27 (×15): qty 2

## 2017-05-27 MED ORDER — POTASSIUM CHLORIDE CRYS ER 20 MEQ PO TBCR
20.0000 meq | EXTENDED_RELEASE_TABLET | Freq: Two times a day (BID) | ORAL | Status: DC
  Administered 2017-05-27 – 2017-05-30 (×7): 20 meq via ORAL
  Filled 2017-05-27 (×7): qty 1

## 2017-05-27 NOTE — Progress Notes (Signed)
PROGRESS NOTE    Patient: Alejandra Taylor     PCP: Mina Marble D, NP                    DOB: 1960-07-30            DOA: 05/23/2017 [id number]             DOS: 05/27/2017, 11:11 AM  Date of Service: the patient was seen and examined on 05/27/2017  Subjective: Patient was seen and examined this a.m.  Awake alert oriented no acute distress.  Reported that she was trying to this a.m. she had stool incontinence he still having diarrhea.  Also she reports that she has had low-grade fever overnight. Overall not feeling too well this a.m.  ---------------------------------------------------------------------------------------------------------------------- Brief Narrative:  56 year old female with history of hypertension, dyslipidemia, diabetes mellitus, hypothyroidism, depression and anxiety, chronic back pain due to spinal stenosis, in the atrial cancer and status post hysterectomy in 2011, no chemotherapy or radiation, history of tobacco abuse. Patient presented with abdominal pain, diarrhea and dysuria for past 3 days prior to this admission, progressively worsening. Pain in the abdomen is in pelvis area, constant, 7 out of 10 in intensity. Patient also reports pain is sharp, nonradiating. She has had associated watery diarrhea on the day of the admission. No fevers or vomiting.  On admission, a shunt was hemodynamically stable. White blood cell count was 18.8. Urinalysis showed moderate leukocytes, sodium was 120, creatinine was 2.71. CT abdomen showed transverse and descending infectious or inflammatory colitis but no evidence of perforation or abscess. Patient was started on empiric Cipro and Flagyl. ------------------------------------------------------------------------------------------------------------------------------------------------------------------------  Assessment & Plan:   Colitis / Leukocytosis - CT scan on the admission showed transverse and descending infectious or  inflammatory colitis but no perforation or abscess - Continue Cipro and Flagyl, switch from IV to p.o., - Patient says diarrhea has improved but this a.m. had an episode. - C. difficile is negative - Continue supportive care with fluids     Diabetes mellitus without complications - Continue sliding scale insulin We will checking BS  before every meal CHS    Acute kidney injury - Likely related to acute infectious process, colitis and UTI - Cr slowly trending down with IV fluids, BUN/creatinine improving -Avoid nephrotoxins - Follow up BMP    Hypothyroidism - TSH normal at 2.977 - Continue synthroid     Dyslipidemia associated with type 2 DM - Continue Zocor, Zetia and fish oil    Tobacco abuse - Counseled on smoking cessation     UTI (urinary tract infection) - Moderate leukocytes seen on urinalysis - Continue cipro - Urine cx reporting 40,000 colonies of E. coli resistant to ampicillin and sulbactam otherwise since the rest of antibiotics including Cipro     Depression with anxiety - Continue Klonopin and Seroquel - Stable     Hyponatremia - Likely due to dehydration, resolved  - Sodium 134 this am,    Hypokalemia -Improving, rebleeding   Right side rib cage pain - CXR within normal limits    Falls - Pt reported occasional falls - PT eval ordered   DVT prophylaxis: Lovenox subQ Code Status: full code  Family Communication: no family at the bedside  Disposition Plan: follow up daily BMP  Code Status:         Full code    Family Communication:  The above findings and plan of care has been discussed with patient and she  expressed understanding and agreement of  above.  Consultants: none   Procedures:  No admission procedures for hospital encounter.   Antimicrobials:  Anti-infectives (From admission, onward)   Start     Dose/Rate Route Frequency Ordered Stop   05/26/17 2000  ciprofloxacin (CIPRO) tablet 500 mg     500 mg Oral 2 times daily  05/26/17 1144     05/26/17 1400  metroNIDAZOLE (FLAGYL) tablet 500 mg     500 mg Oral Every 8 hours 05/26/17 1144     05/25/17 2200  ciprofloxacin (CIPRO) IVPB 400 mg  Status:  Discontinued     400 mg 200 mL/hr over 60 Minutes Intravenous Every 12 hours 05/25/17 1239 05/26/17 1144   05/24/17 2200  ciprofloxacin (CIPRO) IVPB 400 mg  Status:  Discontinued     400 mg 200 mL/hr over 60 Minutes Intravenous Every 24 hours 05/24/17 0330 05/25/17 1239   05/24/17 0800  metroNIDAZOLE (FLAGYL) IVPB 500 mg  Status:  Discontinued     500 mg 100 mL/hr over 60 Minutes Intravenous Every 8 hours 05/24/17 0218 05/26/17 1144   05/23/17 2300  ciprofloxacin (CIPRO) IVPB 400 mg     400 mg 200 mL/hr over 60 Minutes Intravenous  Once 05/23/17 2255 05/24/17 0024   05/23/17 2300  metroNIDAZOLE (FLAGYL) IVPB 500 mg     500 mg 100 mL/hr over 60 Minutes Intravenous  Once 05/23/17 2255 05/24/17 0140        Objective: Vitals:   05/26/17 0608 05/26/17 1341 05/26/17 2102 05/27/17 0624  BP: (!) 128/59 (!) 146/69 (!) 143/79 (!) 160/88  Pulse: (!) 103 92 (!) 103 (!) 103  Resp:  18 17 17   Temp: 98.6 F (37 C) 97.8 F (36.6 C) 98.9 F (37.2 C) 99.4 F (37.4 C)  TempSrc: Oral Oral Oral Oral  SpO2: 99% 100% 98% 99%  Weight:      Height:        Intake/Output Summary (Last 24 hours) at 05/27/2017 1111 Last data filed at 05/27/2017 0300 Gross per 24 hour  Intake 2925 ml  Output -  Net 2925 ml   Filed Weights   05/23/17 1514  Weight: 81.6 kg (180 lb)    Examination:  General exam: Appears calm and comfortable  Respiratory system: Clear to auscultation. Respiratory effort normal. Cardiovascular system: S1 & S2 heard, RRR. No JVD, murmurs, rubs, gallops or clicks. No pedal edema. Gastrointestinal system: Abdomen is nondistended, soft and nontender. No organomegaly or masses felt. Normal bowel sounds heard. Central nervous system: Alert and oriented. No focal neurological deficits. Extremities: Symmetric 5  x 5 power. Skin: No rashes, lesions or ulcers Psychiatry: Judgement and insight appear normal. Mood & affect appropriate.     Data Reviewed: I have personally reviewed following labs and imaging studies  CBC: Recent Labs  Lab 05/23/17 2103 05/24/17 0601 05/25/17 0903 05/26/17 0622 05/27/17 0551  WBC 18.8* 15.7* 11.7* 11.0* 13.8*  HGB 13.2 11.3* 9.9* 9.9* 10.3*  HCT 35.1* 30.8* 27.3* 28.4* 29.3*  MCV 89.3 90.1 91.3 93.1 93.0  PLT 253 212 190 222 024   Basic Metabolic Panel: Recent Labs  Lab 05/24/17 0500 05/24/17 0601 05/25/17 0903 05/26/17 0622 05/27/17 0551  NA QUESTIONABLE RESULTS, RECOMMEND RECOLLECT TO VERIFY 120* 127* 130* 134*  K QUESTIONABLE RESULTS, RECOMMEND RECOLLECT TO VERIFY 3.5 3.3* 3.5 3.2*  CL QUESTIONABLE RESULTS, RECOMMEND RECOLLECT TO VERIFY 85* 96* 102 106  CO2 QUESTIONABLE RESULTS, RECOMMEND RECOLLECT TO VERIFY 21* 22 19* 19*  GLUCOSE QUESTIONABLE RESULTS, RECOMMEND RECOLLECT TO VERIFY 169* 121*  114* 129*  BUN QUESTIONABLE RESULTS, RECOMMEND RECOLLECT TO VERIFY 41* 34* 25* 16  CREATININE QUESTIONABLE RESULTS, RECOMMEND RECOLLECT TO VERIFY 2.67* 2.24* 1.85* 1.46*  CALCIUM QUESTIONABLE RESULTS, RECOMMEND RECOLLECT TO VERIFY 8.2* 8.0* 8.0* 8.0*   GFR: Estimated Creatinine Clearance: 47.3 mL/min (A) (by C-G formula based on SCr of 1.46 mg/dL (H)). Liver Function Tests: Recent Labs  Lab 05/23/17 2103  AST 39  ALT 24  ALKPHOS 141*  BILITOT 0.7  PROT 7.1  ALBUMIN 3.3*   Recent Labs  Lab 05/23/17 2103  LIPASE 17   No results for input(s): AMMONIA in the last 168 hours. Coagulation Profile: No results for input(s): INR, PROTIME in the last 168 hours. Cardiac Enzymes: No results for input(s): CKTOTAL, CKMB, CKMBINDEX, TROPONINI in the last 168 hours. BNP (last 3 results) No results for input(s): PROBNP in the last 8760 hours. HbA1C: No results for input(s): HGBA1C in the last 72 hours. CBG: Recent Labs  Lab 05/26/17 0748 05/26/17 1131  05/26/17 1625 05/26/17 2105 05/27/17 0724  GLUCAP 138* 157* 92 156* 134*   Lipid Profile: No results for input(s): CHOL, HDL, LDLCALC, TRIG, CHOLHDL, LDLDIRECT in the last 72 hours. Thyroid Function Tests: Recent Labs    05/25/17 0903  TSH 2.977   Anemia Panel: No results for input(s): VITAMINB12, FOLATE, FERRITIN, TIBC, IRON, RETICCTPCT in the last 72 hours. Sepsis Labs: Recent Labs  Lab 05/24/17 0041 05/24/17 0358  PROCALCITON 6.05  --   LATICACIDVEN 1.7 QUESTIONABLE RESULTS, RECOMMEND RECOLLECT TO VERIFY    Recent Results (from the past 240 hour(s))  Culture, blood (x 2)     Status: None (Preliminary result)   Collection Time: 05/24/17 12:26 AM  Result Value Ref Range Status   Specimen Description BLOOD LEFT WRIST  Final   Special Requests   Final    BOTTLES DRAWN AEROBIC AND ANAEROBIC Blood Culture adequate volume   Culture   Final    NO GROWTH 3 DAYS Performed at [facility], [place] [address], [place], [place] [address]    Report Status PENDING  Incomplete  Culture, blood (x 2)     Status: None (Preliminary result)   Collection Time: 05/24/17 12:41 AM  Result Value Ref Range Status   Specimen Description BLOOD LEFT ANTECUBITAL  Final   Special Requests IN PEDIATRIC BOTTLE Blood Culture adequate volume  Final   Culture   Final    NO GROWTH 3 DAYS Performed at [facility], [place] [address] [place], [place] [address]    Report Status PENDING  Incomplete  Urine Culture     Status: Abnormal   Collection Time: 05/24/17  5:30 AM  Result Value Ref Range Status   Specimen Description URINE, CLEAN CATCH  Final   Special Requests NONE  Final   Culture 40,000 COLONIES/mL ESCHERICHIA COLI (A)  Final   Report Status 05/27/2017 FINAL  Final   Organism ID, Bacteria ESCHERICHIA COLI (A)  Final      Susceptibility   Escherichia coli - MIC*    AMPICILLIN >=32 RESISTANT Resistant     CEFAZOLIN 8 SENSITIVE Sensitive     CEFTRIAXONE <=1 SENSITIVE Sensitive      CIPROFLOXACIN 0.5 SENSITIVE Sensitive     GENTAMICIN <=1 SENSITIVE Sensitive     IMIPENEM <=0.25 SENSITIVE Sensitive     NITROFURANTOIN <=16 SENSITIVE Sensitive     TRIMETH/SULFA <=20 SENSITIVE Sensitive     AMPICILLIN/SULBACTAM >=32 RESISTANT Resistant     PIP/TAZO <=4 SENSITIVE Sensitive  Extended ESBL NEGATIVE Sensitive     * 40,000 COLONIES/mL ESCHERICHIA COLI  C difficile quick scan w PCR reflex     Status: None   Collection Time: 05/24/17  7:58 AM  Result Value Ref Range Status   C Diff antigen NEGATIVE NEGATIVE Final   C Diff toxin NEGATIVE NEGATIVE Final   C Diff interpretation No C. difficile detected.  Final       Radiology Studies: No results found.  Scheduled Meds: . acidophilus  2 capsule Oral TID  . calcium-vitamin D  1 tablet Oral Daily  . ciprofloxacin  500 mg Oral BID  . clonazePAM  1 mg Oral TID  . enoxaparin (LOVENOX) injection  40 mg Subcutaneous QHS  . ezetimibe  10 mg Oral Daily  . folic acid  242 mcg Oral Daily  . insulin aspart  0-5 Units Subcutaneous QHS  . insulin aspart  0-9 Units Subcutaneous TID WC  . levothyroxine  125 mcg Oral QAC breakfast  . methocarbamol  500 mg Oral BID  . metroNIDAZOLE  500 mg Oral Q8H  . multivitamin with minerals  1 tablet Oral Daily  . omega-3 acid ethyl esters  1 g Oral Daily  . pantoprazole  40 mg Oral Daily  . psyllium  1 packet Oral TID  . QUEtiapine  300 mg Oral QHS  . simvastatin  40 mg Oral QPM  . topiramate  50 mg Oral QHS  . venlafaxine XR  75 mg Oral QHS  . vitamin C  500 mg Oral Daily  . vitamin E  200 Units Oral Daily   Continuous Infusions: . sodium chloride 125 mL/hr at 05/26/17 1807     LOS: 3 days    Time spent: 25   Deatra James, MD [facility] Pager [phone]  If 7PM-7AM, please contact night-coverage [email or web address] Password [email or web address] 05/27/2017, 11:11 AM

## 2017-05-27 NOTE — Progress Notes (Signed)
Date:  May 27, 2017 Chart reviewed for concurrent status and case management needs.  Will continue to follow patient progress.  Discharge Planning: following for needs  Expected discharge date: May 30, 2017  Abdelaziz Westenberger, BSN, RN3, CCM   [phone]  

## 2017-05-27 NOTE — Care Management Important Message (Signed)
Important Message  Patient Details  Name: Alejandra Taylor MRN: [id number] Date of Birth: Aug 23, 1960   Medicare Important Message Given:  Yes    Kerin Salen 05/27/2017, 11:05 AMImportant Message  Patient Details  Name: Alejandra Taylor MRN: [id number] Date of Birth: [birth year]   Medicare Important Message Given:  Yes    Kerin Salen 05/27/2017, 11:04 AM

## 2017-05-28 LAB — GLUCOSE, CAPILLARY
Glucose-Capillary: 132 mg/dL — ABNORMAL HIGH (ref 65–99)
Glucose-Capillary: 130 mg/dL — ABNORMAL HIGH (ref 65–99)
Glucose-Capillary: 127 mg/dL — ABNORMAL HIGH (ref 65–99)
Glucose-Capillary: 130 mg/dL — ABNORMAL HIGH (ref 65–99)
Glucose-Capillary: 152 mg/dL — ABNORMAL HIGH (ref 65–99)

## 2017-05-28 LAB — BASIC METABOLIC PANEL
Anion gap: 7 (ref 5–15)
BUN: 9 mg/dL (ref 6–20)
CO2: 21 mmol/L — ABNORMAL LOW (ref 22–32)
Calcium: 7.8 mg/dL — ABNORMAL LOW (ref 8.9–10.3)
Chloride: 105 mmol/L (ref 101–111)
Creatinine, Ser: 1.23 mg/dL — ABNORMAL HIGH (ref 0.44–1.00)
GFR calc Af Amer: 56 mL/min — ABNORMAL LOW (ref >60)
GFR calc non Af Amer: 48 mL/min — ABNORMAL LOW (ref >60)
Glucose, Bld: 121 mg/dL — ABNORMAL HIGH (ref 65–99)
Potassium: 3.2 mmol/L — ABNORMAL LOW (ref 3.5–5.1)
Sodium: 133 mmol/L — ABNORMAL LOW (ref 135–145)

## 2017-05-28 LAB — CBC
HCT: 27.2 % — ABNORMAL LOW (ref 36.0–46.0)
Hemoglobin: 9.5 g/dL — ABNORMAL LOW (ref 12.0–15.0)
MCH: 32.8 pg (ref 26.0–34.0)
MCHC: 34.9 g/dL (ref 30.0–36.0)
MCV: 93.8 fL (ref 78.0–100.0)
Platelets: 241 10*3/uL (ref 150–400)
RBC: 2.9 MIL/uL — ABNORMAL LOW (ref 3.87–5.11)
RDW: 13.5 % (ref 11.5–15.5)
WBC: 16.4 10*3/uL — ABNORMAL HIGH (ref 4.0–10.5)

## 2017-05-28 MED ORDER — POTASSIUM CHLORIDE CRYS ER 20 MEQ PO TBCR
40.0000 meq | EXTENDED_RELEASE_TABLET | Freq: Two times a day (BID) | ORAL | Status: AC
  Administered 2017-05-28 (×2): 40 meq via ORAL
  Filled 2017-05-28 (×2): qty 2

## 2017-05-28 NOTE — Progress Notes (Signed)
[facility] PROGRESS NOTE    Progress Note  Alejandra Taylor  YTK:[id number] DOB: 1961/04/03 DOA: 05/23/2017 PCP: Esaw Grandchild, NP     Brief Narrative:   Alejandra Taylor is an 56 y.o. female past medical history of hypertension, diabetes mellitus, chronic back pain due to spinal stenosis  Assessment/Plan:   Colitis/ Leukocytosis: CT scan of the abdomen and pelvis on admission showed transverse and descending inflammation. She was started on IV Cipro Flagyl on admission and changed to oral. She relates her diarrhea has not improved,, her leukocytosis is worsening, her abdominal exam is benign we will repeat a CBC in the morning.  Advance her diet to full.  And will monitor diarrhea. Fast diet to soft diet.  Diabetes mellitus without complications: Continue sliding scale insulin good control.  Acute kidney injury: Likely prerenal in etiology improving with IV fluid hydration.  Hypothyroidism: No changes were made.  Dyslipidemia: Continue current regimen no changes were made.  Tobacco abuse: Counseling.  Hyponatremia: Likely due to hypovolemia resolved with IV fluid hydration.  Hypokalemia: Resolved.  Falls: Occasionally, physical therapy was consulted, they recommended no PT follow-up.  DVT prophylaxis: loveox Family Communication:none Disposition Plan/Barrier to D/C: home once diarhea improved Code Status:     Code Status Orders  (From admission, onward)        Start     Ordered   05/24/17 0220  Full code  Continuous     05/24/17 0220    Code Status History    Date Active Date Inactive Code Status Order ID Comments User Context   This patient has a current code status but no historical code status.        IV Access:    Peripheral IV   Procedures and diagnostic studies:   No results found.   Medical Consultants:    None.  Anti-Infectives:   Ciprofloxacin and Flagyl.  Subjective:    Alejandra Taylor she relates she is  would like her diet advanced, she related her appetite is improved but her diarrhea has not improved.  Objective:    Vitals:   05/27/17 0624 05/27/17 1500 05/27/17 2120 05/28/17 0656  BP: (!) 160/88 (!) 152/60 (!) 162/80 137/68  Pulse: (!) 103 (!) 101 96 97  Resp: 17 18 18 18   Temp: 99.4 F (37.4 C) 98.5 F (36.9 C) 98 F (36.7 C) 98.7 F (37.1 C)  TempSrc: Oral Oral Oral Oral  SpO2: 99% 99% 99% 98%  Weight:      Height:        Intake/Output Summary (Last 24 hours) at 05/28/2017 0954 Last data filed at 05/28/2017 0845 Gross per 24 hour  Intake 1860 ml  Output -  Net 1860 ml   Filed Weights   05/23/17 1514  Weight: 81.6 kg (180 lb)    Exam: General exam: In no acute distress. Respiratory system: Good air movement and clear to auscultation. Cardiovascular system: S1 & S2 heard, RRR. Gastrointestinal system: Abdomen is nondistended, soft and nontender.  Central nervous system: Alert and oriented. No focal neurological deficits. Extremities: No pedal edema. Skin: No rashes, lesions or ulcers    Data Reviewed:    Labs: Basic Metabolic Panel: Recent Labs  Lab 05/24/17 0601 05/25/17 0903 05/26/17 0622 05/27/17 0551 05/28/17 0559  NA 120* 127* 130* 134* 133*  K 3.5 3.3* 3.5 3.2* 3.2*  CL 85* 96* 102 106 105  CO2 21* 22 19* 19* 21*  GLUCOSE 169* 121* 114* 129* 121*  BUN 41* 34* 25*  16 9  CREATININE 2.67* 2.24* 1.85* 1.46* 1.23*  CALCIUM 8.2* 8.0* 8.0* 8.0* 7.8*  MG  --   --   --  1.4*  --    GFR Estimated Creatinine Clearance: 56.1 mL/min (A) (by C-G formula based on SCr of 1.23 mg/dL (H)). Liver Function Tests: Recent Labs  Lab 05/23/17 2103  AST 39  ALT 24  ALKPHOS 141*  BILITOT 0.7  PROT 7.1  ALBUMIN 3.3*   Recent Labs  Lab 05/23/17 2103  LIPASE 17   No results for input(s): AMMONIA in the last 168 hours. Coagulation profile No results for input(s): INR, PROTIME in the last 168 hours.  CBC: Recent Labs  Lab 05/24/17 0601  05/25/17 0903 05/26/17 0622 05/27/17 0551 05/28/17 0559  WBC 15.7* 11.7* 11.0* 13.8* 16.4*  HGB 11.3* 9.9* 9.9* 10.3* 9.5*  HCT 30.8* 27.3* 28.4* 29.3* 27.2*  MCV 90.1 91.3 93.1 93.0 93.8  PLT 212 190 222 216 241   Cardiac Enzymes: No results for input(s): CKTOTAL, CKMB, CKMBINDEX, TROPONINI in the last 168 hours. BNP (last 3 results) No results for input(s): PROBNP in the last 8760 hours. CBG: Recent Labs  Lab 05/27/17 0724 05/27/17 1207 05/27/17 1657 05/27/17 2123 05/28/17 0756  GLUCAP 134* 98 193* 132* 130*   D-Dimer: No results for input(s): DDIMER in the last 72 hours. Hgb A1c: No results for input(s): HGBA1C in the last 72 hours. Lipid Profile: No results for input(s): CHOL, HDL, LDLCALC, TRIG, CHOLHDL, LDLDIRECT in the last 72 hours. Thyroid function studies: No results for input(s): TSH, T4TOTAL, T3FREE, THYROIDAB in the last 72 hours.  Invalid input(s): FREET3 Anemia work up: No results for input(s): VITAMINB12, FOLATE, FERRITIN, TIBC, IRON, RETICCTPCT in the last 72 hours. Sepsis Labs: Recent Labs  Lab 05/24/17 0041 05/24/17 0358  05/25/17 0903 05/26/17 0622 05/27/17 0551 05/28/17 0559  PROCALCITON 6.05  --   --   --   --   --   --   WBC  --   --    < > 11.7* 11.0* 13.8* 16.4*  LATICACIDVEN 1.7 QUESTIONABLE RESULTS, RECOMMEND RECOLLECT TO VERIFY  --   --   --   --   --    < > = values in this interval not displayed.   Microbiology Recent Results (from the past 240 hour(s))  Culture, blood (x 2)     Status: None (Preliminary result)   Collection Time: 05/24/17 12:26 AM  Result Value Ref Range Status   Specimen Description BLOOD LEFT WRIST  Final   Special Requests   Final    BOTTLES DRAWN AEROBIC AND ANAEROBIC Blood Culture adequate volume   Culture   Final    NO GROWTH 3 DAYS Performed at [facility], [address] [place], [place] [address]    Report Status PENDING  Incomplete  Culture, blood (x 2)     Status: None (Preliminary  result)   Collection Time: 05/24/17 12:41 AM  Result Value Ref Range Status   Specimen Description BLOOD LEFT ANTECUBITAL  Final   Special Requests IN PEDIATRIC BOTTLE Blood Culture adequate volume  Final   Culture   Final    NO GROWTH 3 DAYS Performed at [facility], [place] [address] [place], [place] [address]    Report Status PENDING  Incomplete  Urine Culture     Status: Abnormal   Collection Time: 05/24/17  5:30 AM  Result Value Ref Range Status   Specimen Description URINE, CLEAN CATCH  Final   Special Requests NONE  Final   Culture 40,000 COLONIES/mL ESCHERICHIA COLI (A)  Final   Report Status 05/27/2017 FINAL  Final   Organism ID, Bacteria ESCHERICHIA COLI (A)  Final      Susceptibility   Escherichia coli - MIC*    AMPICILLIN >=32 RESISTANT Resistant     CEFAZOLIN 8 SENSITIVE Sensitive     CEFTRIAXONE <=1 SENSITIVE Sensitive     CIPROFLOXACIN 0.5 SENSITIVE Sensitive     GENTAMICIN <=1 SENSITIVE Sensitive     IMIPENEM <=0.25 SENSITIVE Sensitive     NITROFURANTOIN <=16 SENSITIVE Sensitive     TRIMETH/SULFA <=20 SENSITIVE Sensitive     AMPICILLIN/SULBACTAM >=32 RESISTANT Resistant     PIP/TAZO <=4 SENSITIVE Sensitive     Extended ESBL NEGATIVE Sensitive     * 40,000 COLONIES/mL ESCHERICHIA COLI  C difficile quick scan w PCR reflex     Status: None   Collection Time: 05/24/17  7:58 AM  Result Value Ref Range Status   C Diff antigen NEGATIVE NEGATIVE Final   C Diff toxin NEGATIVE NEGATIVE Final   C Diff interpretation No C. difficile detected.  Final     Medications:   . acidophilus  2 capsule Oral TID  . calcium-vitamin D  1 tablet Oral Daily  . ciprofloxacin  500 mg Oral BID  . clonazePAM  1 mg Oral TID  . enoxaparin (LOVENOX) injection  40 mg Subcutaneous QHS  . ezetimibe  10 mg Oral Daily  . folic acid  481 mcg Oral Daily  . insulin aspart  0-5 Units Subcutaneous QHS  . insulin aspart  0-9 Units Subcutaneous TID WC  . levothyroxine  125 mcg Oral QAC  breakfast  . methocarbamol  500 mg Oral BID  . metroNIDAZOLE  500 mg Oral Q8H  . multivitamin with minerals  1 tablet Oral Daily  . omega-3 acid ethyl esters  1 g Oral Daily  . pantoprazole  40 mg Oral Daily  . potassium chloride  20 mEq Oral BID  . psyllium  1 packet Oral TID  . QUEtiapine  300 mg Oral QHS  . simvastatin  40 mg Oral QPM  . topiramate  50 mg Oral QHS  . venlafaxine XR  75 mg Oral QHS  . vitamin C  500 mg Oral Daily  . vitamin E  200 Units Oral Daily   Continuous Infusions: . sodium chloride 125 mL/hr at 05/28/17 0704     LOS: 4 days   Charlynne Cousins  [facility] Pager [phone]  *Please refer to [email or web address], password [email or web address] to get updated schedule on who will round on this patient, as hospitalists switch teams weekly. If 7PM-7AM, please contact night-coverage at [email or web address], password [email or web address] for any overnight needs.  05/28/2017, 9:54 AM

## 2017-05-28 NOTE — Progress Notes (Signed)
Physical Therapy Treatment Patient Details Name: Alejandra Taylor MRN: [id number] DOB: 28-Apr-1961 Today's Date: 05/28/2017    History of Present Illness 56 yo female admitted with colitis, sepsis. Hx of DM, HTN, PTSD, falls, spinal stenosis, chronic back pain, endometrial cancer.     PT Comments    Pt feeling better.  Assisted OOB to amb to bathroom then in hallway not holding to IV pole to access gait stability and balance.  Pt present with good alternating gait, functional speed and no need for any AD.  Will consult LPT, pt has reach maximal mobility level for this setting.  Follow Up Recommendations  No PT follow up;Supervision - Intermittent     Equipment Recommendations  None recommended by PT    Recommendations for Other Services       Precautions / Restrictions Precautions Precautions: Fall Restrictions Weight Bearing Restrictions: No    Mobility  Bed Mobility Overal bed mobility: Modified Independent                Transfers Overall transfer level: Modified independent               General transfer comment: pt admits she gets to and from bathroom by herself pushing the IV pole  Ambulation/Gait Ambulation/Gait assistance: Supervision Ambulation Distance (Feet): 250 Feet Assistive device: None Gait Pattern/deviations: Step-through pattern Gait velocity: WFL   General Gait Details: amb without holding IV pole to access gait stability and balance.  Demonstartes a good alternating with NO LOB and no need for any AD.     Stairs            Wheelchair Mobility    Modified Rankin (Stroke Patients Only)       Balance                                            Cognition Arousal/Alertness: Awake/alert Behavior During Therapy: WFL for tasks assessed/performed Overall Cognitive Status: Within Functional Limits for tasks assessed                                        Exercises      General Comments         Pertinent Vitals/Pain Pain Assessment: No/denies pain    Home Living                      Prior Function            PT Goals (current goals can now be found in the care plan section) Progress towards PT goals: Progressing toward goals    Frequency    Min 3X/week      PT Plan Current plan remains appropriate    Co-evaluation              AM-PAC PT "6 Clicks" Daily Activity  Outcome Measure  Difficulty turning over in bed (including adjusting bedclothes, sheets and blankets)?: None Difficulty moving from lying on back to sitting on the side of the bed? : None Difficulty sitting down on and standing up from a chair with arms (e.g., wheelchair, bedside commode, etc,.)?: None Help needed moving to and from a bed to chair (including a wheelchair)?: A Little Help needed walking in hospital room?: A Little Help needed climbing 3-5 steps with  a railing? : A Little 6 Click Score: 21    End of Session Equipment Utilized During Treatment: Gait belt Activity Tolerance: Patient tolerated treatment well Patient left: in chair;with call bell/phone within reach   PT Visit Diagnosis: Muscle weakness (generalized) (M62.81);Difficulty in walking, not elsewhere classified (R26.2)     Time: 1120-1140 PT Time Calculation (min) (ACUTE ONLY): 20 min  Charges:  $Gait Training: 8-22 mins                    G Codes:       {Deldrick Linch  PTA [facility]  Acute  Rehab Pager      [phone]

## 2017-05-29 DIAGNOSIS — N179 Acute kidney failure, unspecified: Secondary | ICD-10-CM

## 2017-05-29 DIAGNOSIS — Z720 Tobacco use: Secondary | ICD-10-CM

## 2017-05-29 DIAGNOSIS — E871 Hypo-osmolality and hyponatremia: Secondary | ICD-10-CM

## 2017-05-29 DIAGNOSIS — I10 Essential (primary) hypertension: Secondary | ICD-10-CM

## 2017-05-29 DIAGNOSIS — K529 Noninfective gastroenteritis and colitis, unspecified: Secondary | ICD-10-CM

## 2017-05-29 LAB — GLUCOSE, CAPILLARY
Glucose-Capillary: 114 mg/dL — ABNORMAL HIGH (ref 65–99)
Glucose-Capillary: 176 mg/dL — ABNORMAL HIGH (ref 65–99)
Glucose-Capillary: 95 mg/dL (ref 65–99)
Glucose-Capillary: 129 mg/dL — ABNORMAL HIGH (ref 65–99)

## 2017-05-29 LAB — BASIC METABOLIC PANEL
Anion gap: 7 (ref 5–15)
BUN: 6 mg/dL (ref 6–20)
CO2: 19 mmol/L — ABNORMAL LOW (ref 22–32)
Calcium: 7.8 mg/dL — ABNORMAL LOW (ref 8.9–10.3)
Chloride: 108 mmol/L (ref 101–111)
Creatinine, Ser: 1.02 mg/dL — ABNORMAL HIGH (ref 0.44–1.00)
GFR calc Af Amer: >60 mL/min (ref >60)
GFR calc non Af Amer: >60 mL/min (ref >60)
Glucose, Bld: 121 mg/dL — ABNORMAL HIGH (ref 65–99)
Potassium: 3.9 mmol/L (ref 3.5–5.1)
Sodium: 134 mmol/L — ABNORMAL LOW (ref 135–145)

## 2017-05-29 LAB — CULTURE, BLOOD (ROUTINE X 2)
Culture: NO GROWTH
Culture: NO GROWTH
Special Requests: ADEQUATE
Special Requests: ADEQUATE

## 2017-05-29 LAB — CBC WITH DIFFERENTIAL/PLATELET
Basophils Absolute: 0 10*3/uL (ref 0.0–0.1)
Basophils Relative: 0 %
Eosinophils Absolute: 0.4 10*3/uL (ref 0.0–0.7)
Eosinophils Relative: 3 %
HCT: 25.9 % — ABNORMAL LOW (ref 36.0–46.0)
Hemoglobin: 8.9 g/dL — ABNORMAL LOW (ref 12.0–15.0)
Lymphocytes Relative: 14 %
Lymphs Abs: 2 10*3/uL (ref 0.7–4.0)
MCH: 32.2 pg (ref 26.0–34.0)
MCHC: 34.4 g/dL (ref 30.0–36.0)
MCV: 93.8 fL (ref 78.0–100.0)
Monocytes Absolute: 1.4 10*3/uL — ABNORMAL HIGH (ref 0.1–1.0)
Monocytes Relative: 10 %
Neutro Abs: 10.5 10*3/uL — ABNORMAL HIGH (ref 1.7–7.7)
Neutrophils Relative %: 73 %
Platelets: 258 10*3/uL (ref 150–400)
RBC: 2.76 MIL/uL — ABNORMAL LOW (ref 3.87–5.11)
RDW: 13.7 % (ref 11.5–15.5)
WBC: 14.3 10*3/uL — ABNORMAL HIGH (ref 4.0–10.5)

## 2017-05-29 MED ORDER — LOPERAMIDE HCL 2 MG PO CAPS: 2.0000 mg | ORAL_CAPSULE | Freq: Four times a day (QID) | ORAL | Status: DC | PRN

## 2017-05-29 MED ORDER — LOPERAMIDE HCL 2 MG PO CAPS
4.0000 mg | ORAL_CAPSULE | Freq: Once | ORAL | Status: AC
  Administered 2017-05-29: 4 mg via ORAL
  Filled 2017-05-29: qty 2

## 2017-05-29 NOTE — Progress Notes (Addendum)
PROGRESS NOTE    Alejandra Taylor  DXA:[id number] DOB: [birth year] DOA: 05/23/2017 PCP: Esaw Grandchild, NP   Brief Narrative:  56 year old female with history of hypertension, dyslipidemia, diabetes mellitus, hypothyroidism, depression and anxiety, chronic back pain due to spinal stenosis, in the atrial cancer and status post hysterectomy in 2011, no chemotherapy or radiation, history of tobacco abuse. Patient presented with abdominal pain, diarrhea and dysuria for past 3 days prior to this admission, progressively worsening. Pain in the abdomen is in pelvis area, constant, 7 out of 10 in intensity. Patient also reports pain is sharp, nonradiating. She has had associated watery diarrhea on the day of the admission. No fevers or vomiting.  She was started on cipro/flagyl on admission.  Assessment & Plan:   Principal Problem:   Colitis Active Problems:   Essential hypertension   Hypothyroidism   Hyperlipidemia   Tobacco abuse   UTI (urinary tract infection)   Depression with anxiety   AKI (acute kidney injury) ([place])   Hyponatremia   Colitis/ Leukocytosis: CT scan of the abdomen and pelvis on admission showed transverse and descending inflammation (concerning for infectious or inflammatory colitis) She denies fam hx of IBD _0  f/u esr/crp _1  f/u GI pathogen panel Given her persistent frequent diarrhea which has not improved, will check GI pathogen panel and also start imodium.   Continue cipro/flagy Continue soft diet  E. Coli UTI - on cipro as above  Diabetes mellitus without complications: Continue sliding scale insulin good control.  Acute kidney injury: Likely prerenal in etiology improving with IV fluid hydration.  Hypothyroidism: No changes were made.  Dyslipidemia: Continue current regimen no changes were made.  Tobacco abuse: Has been counseled by previous providers  Hyponatremia: Improving  Hypokalemia: Resolved.  Falls: Occasionally,  physical therapy was consulted, they recommended no PT follow-up.  DVT prophylaxis: lovenox Code Status: full  Family Communication: none at bedside Disposition Plan: pending improvement   Consultants:   none  Procedures: (Don't include imaging studies which can be auto populated. Include things that cannot be auto populated i.e. Echo, Carotid and venous dopplers, Foley, Bipap, HD, tubes/drains, wound vac, central lines etc)  none  Antimicrobials: (specify start and planned stop date. Auto populated tables are space occupying and do not give end dates) Anti-infectives (From admission, onward)   Start     Dose/Rate Route Frequency Ordered Stop   05/26/17 2000  ciprofloxacin (CIPRO) tablet 500 mg     500 mg Oral 2 times daily 05/26/17 1144     05/26/17 1400  metroNIDAZOLE (FLAGYL) tablet 500 mg     500 mg Oral Every 8 hours 05/26/17 1144     05/25/17 2200  ciprofloxacin (CIPRO) IVPB 400 mg  Status:  Discontinued     400 mg 200 mL/hr over 60 Minutes Intravenous Every 12 hours 05/25/17 1239 05/26/17 1144   05/24/17 2200  ciprofloxacin (CIPRO) IVPB 400 mg  Status:  Discontinued     400 mg 200 mL/hr over 60 Minutes Intravenous Every 24 hours 05/24/17 0330 05/25/17 1239   05/24/17 0800  metroNIDAZOLE (FLAGYL) IVPB 500 mg  Status:  Discontinued     500 mg 100 mL/hr over 60 Minutes Intravenous Every 8 hours 05/24/17 0218 05/26/17 1144   05/23/17 2300  ciprofloxacin (CIPRO) IVPB 400 mg     400 mg 200 mL/hr over 60 Minutes Intravenous  Once 05/23/17 2255 05/24/17 0024   05/23/17 2300  metroNIDAZOLE (FLAGYL) IVPB 500 mg     500 mg 100  mL/hr over 60 Minutes Intravenous  Once 05/23/17 2255 05/24/17 0140        Subjective: Still persistent diarrhea. 6-7 loose stools since last night. Runny, clear.   Cramping is improved.   Objective: Vitals:   05/28/17 1319 05/28/17 2152 05/29/17 0435 05/29/17 1358  BP: (!) 152/53 (!) 146/79 134/65 135/73  Pulse: 93 90 98 83  Resp: _0 Temp: 98.2 F (36.8 C) 97.6 F (36.4 C) 98.9 F (37.2 C) 97.7 F (36.5 C)  TempSrc: Oral Oral Oral Oral  SpO2: 99% 100% 100% 98%  Weight:      Height:        Intake/Output Summary (Last 24 hours) at 05/29/2017 1738 Last data filed at 05/29/2017 1358 Gross per 24 hour  Intake 120 ml  Output -  Net 120 ml   Filed Weights   05/23/17 1514  Weight: 81.6 kg (180 lb)    Examination:  General exam: Appears calm and comfortable  Respiratory system: Clear to auscultation. Respiratory effort normal. Cardiovascular system: S1 & S2 heard, RRR. No JVD, murmurs, rubs, gallops or clicks. No pedal edema. Gastrointestinal system: Abdomen is nondistended, soft and nontender. No organomegaly or masses felt. Normal bowel sounds heard. Central nervous system: Alert and oriented. No focal neurological deficits. Extremities: Symmetric 5 x 5 power. Skin: No rashes, lesions or ulcers Psychiatry: Judgement and insight appear normal. Mood & affect appropriate.     Data Reviewed: I have personally reviewed following labs and imaging studies  CBC: Recent Labs  Lab 05/25/17 0903 05/26/17 0622 05/27/17 0551 05/28/17 0559 05/29/17 0646  WBC 11.7* 11.0* 13.8* 16.4* 14.3*  NEUTROABS  --   --   --   --  10.5*  HGB 9.9* 9.9* 10.3* 9.5* 8.9*  HCT 27.3* 28.4* 29.3* 27.2* 25.9*  MCV 91.3 93.1 93.0 93.8 93.8  PLT 190 222 216 241 035   Basic Metabolic Panel: Recent Labs  Lab 05/25/17 0903 05/26/17 0622 05/27/17 0551 05/28/17 0559 05/29/17 0646  NA 127* 130* 134* 133* 134*  K 3.3* 3.5 3.2* 3.2* 3.9  CL 96* 102 106 105 108  CO2 22 19* 19* 21* 19*  GLUCOSE 121* 114* 129* 121* 121*  BUN 34* 25* _1 CREATININE 2.24* 1.85* 1.46* 1.23* 1.02*  CALCIUM 8.0* 8.0* 8.0* 7.8* 7.8*  MG  --   --  1.4*  --   --    GFR: Estimated Creatinine Clearance: 67.7 mL/min (Markiyah Gahm) (by C-G formula based on SCr of 1.02 mg/dL (H)). Liver Function Tests: Recent Labs  Lab 05/23/17 2103  AST 39  ALT 24  ALKPHOS  141*  BILITOT 0.7  PROT 7.1  ALBUMIN 3.3*   Recent Labs  Lab 05/23/17 2103  LIPASE 17   No results for input(s): AMMONIA in the last 168 hours. Coagulation Profile: No results for input(s): INR, PROTIME in the last 168 hours. Cardiac Enzymes: No results for input(s): CKTOTAL, CKMB, CKMBINDEX, TROPONINI in the last 168 hours. BNP (last 3 results) No results for input(s): PROBNP in the last 8760 hours. HbA1C: No results for input(s): HGBA1C in the last 72 hours. CBG: Recent Labs  Lab 05/28/17 1717 05/28/17 2150 05/29/17 0724 05/29/17 1118 05/29/17 1709  GLUCAP 130* 152* 114* 176* 95   Lipid Profile: No results for input(s): CHOL, HDL, LDLCALC, TRIG, CHOLHDL, LDLDIRECT in the last 72 hours. Thyroid Function Tests: No results for input(s): TSH, T4TOTAL, FREET4, T3FREE, THYROIDAB in the last 72 hours. Anemia Panel: No  results for input(s): VITAMINB12, FOLATE, FERRITIN, TIBC, IRON, RETICCTPCT in the last 72 hours. Sepsis Labs: Recent Labs  Lab 05/24/17 0041 05/24/17 0358  PROCALCITON 6.05  --   LATICACIDVEN 1.7 QUESTIONABLE RESULTS, RECOMMEND RECOLLECT TO VERIFY    Recent Results (from the past 240 hour(s))  Culture, blood (x 2)     Status: None   Collection Time: 05/24/17 12:26 AM  Result Value Ref Range Status   Specimen Description BLOOD LEFT WRIST  Final   Special Requests   Final    BOTTLES DRAWN AEROBIC AND ANAEROBIC Blood Culture adequate volume   Culture   Final    NO GROWTH 5 DAYS Performed at [facility], [address] [place], [place] [address]    Report Status 05/29/2017 FINAL  Final  Culture, blood (x 2)     Status: None   Collection Time: 05/24/17 12:41 AM  Result Value Ref Range Status   Specimen Description BLOOD LEFT ANTECUBITAL  Final   Special Requests IN PEDIATRIC BOTTLE Blood Culture adequate volume  Final   Culture   Final    NO GROWTH 5 DAYS Performed at [facility], [place] [address], [place], [place] [address]    Report  Status 05/29/2017 FINAL  Final  Urine Culture     Status: Abnormal   Collection Time: 05/24/17  5:30 AM  Result Value Ref Range Status   Specimen Description URINE, CLEAN CATCH  Final   Special Requests NONE  Final   Culture 40,000 COLONIES/mL ESCHERICHIA COLI (Marlina Cataldi)  Final   Report Status 05/27/2017 FINAL  Final   Organism ID, Bacteria ESCHERICHIA COLI (Vaudie Engebretsen)  Final      Susceptibility   Escherichia coli - MIC*    AMPICILLIN >=32 RESISTANT Resistant     CEFAZOLIN 8 SENSITIVE Sensitive     CEFTRIAXONE <=1 SENSITIVE Sensitive     CIPROFLOXACIN 0.5 SENSITIVE Sensitive     GENTAMICIN <=1 SENSITIVE Sensitive     IMIPENEM <=0.25 SENSITIVE Sensitive     NITROFURANTOIN <=16 SENSITIVE Sensitive     TRIMETH/SULFA <=20 SENSITIVE Sensitive     AMPICILLIN/SULBACTAM >=32 RESISTANT Resistant     PIP/TAZO <=4 SENSITIVE Sensitive     Extended ESBL NEGATIVE Sensitive     * 40,000 COLONIES/mL ESCHERICHIA COLI  C difficile quick scan w PCR reflex     Status: None   Collection Time: 05/24/17  7:58 AM  Result Value Ref Range Status   C Diff antigen NEGATIVE NEGATIVE Final   C Diff toxin NEGATIVE NEGATIVE Final   C Diff interpretation No C. difficile detected.  Final         Radiology Studies: No results found.      Scheduled Meds: . acidophilus  2 capsule Oral TID  . calcium-vitamin D  1 tablet Oral Daily  . ciprofloxacin  500 mg Oral BID  . clonazePAM  1 mg Oral TID  . enoxaparin (LOVENOX) injection  40 mg Subcutaneous QHS  . ezetimibe  10 mg Oral Daily  . folic acid  093 mcg Oral Daily  . insulin aspart  0-5 Units Subcutaneous QHS  . insulin aspart  0-9 Units Subcutaneous TID WC  . levothyroxine  125 mcg Oral QAC breakfast  . methocarbamol  500 mg Oral BID  . metroNIDAZOLE  500 mg Oral Q8H  . multivitamin with minerals  1 tablet Oral Daily  . omega-3 acid ethyl esters  1 g Oral Daily  . pantoprazole  40 mg Oral Daily  .  potassium chloride  20 mEq Oral BID  . psyllium  1 packet Oral  TID  . QUEtiapine  300 mg Oral QHS  . simvastatin  40 mg Oral QPM  . topiramate  50 mg Oral QHS  . venlafaxine XR  75 mg Oral QHS  . vitamin C  500 mg Oral Daily  . vitamin E  200 Units Oral Daily   Continuous Infusions:   LOS: 5 days    Time spent: over 30 minutes    Fayrene Helper, MD [facility] Pager .acpager   If 7PM-7AM, please contact night-coverage [email or web address] Password [facility] 05/29/2017, 5:38 PM

## 2017-05-30 LAB — GASTROINTESTINAL PANEL BY PCR, STOOL (REPLACES STOOL CULTURE)

## 2017-05-30 LAB — GLUCOSE, CAPILLARY
Glucose-Capillary: 106 mg/dL — ABNORMAL HIGH (ref 65–99)
Glucose-Capillary: 96 mg/dL (ref 65–99)

## 2017-05-30 LAB — COMPREHENSIVE METABOLIC PANEL
ALT: 9 U/L — ABNORMAL LOW (ref 14–54)
AST: 11 U/L — ABNORMAL LOW (ref 15–41)
Albumin: 2.5 g/dL — ABNORMAL LOW (ref 3.5–5.0)
Alkaline Phosphatase: 77 U/L (ref 38–126)
Anion gap: 6 (ref 5–15)
BUN: 6 mg/dL (ref 6–20)
CO2: 21 mmol/L — ABNORMAL LOW (ref 22–32)
Calcium: 8.2 mg/dL — ABNORMAL LOW (ref 8.9–10.3)
Chloride: 110 mmol/L (ref 101–111)
Creatinine, Ser: 0.92 mg/dL (ref 0.44–1.00)
GFR calc Af Amer: >60 mL/min (ref >60)
GFR calc non Af Amer: >60 mL/min (ref >60)
Glucose, Bld: 113 mg/dL — ABNORMAL HIGH (ref 65–99)
Potassium: 4.1 mmol/L (ref 3.5–5.1)
Sodium: 137 mmol/L (ref 135–145)
Total Bilirubin: 0.2 mg/dL — ABNORMAL LOW (ref 0.3–1.2)
Total Protein: 5.5 g/dL — ABNORMAL LOW (ref 6.5–8.1)

## 2017-05-30 LAB — CBC
HCT: 27.2 % — ABNORMAL LOW (ref 36.0–46.0)
Hemoglobin: 9.3 g/dL — ABNORMAL LOW (ref 12.0–15.0)
MCH: 32.3 pg (ref 26.0–34.0)
MCHC: 34.2 g/dL (ref 30.0–36.0)
MCV: 94.4 fL (ref 78.0–100.0)
Platelets: 270 10*3/uL (ref 150–400)
RBC: 2.88 MIL/uL — ABNORMAL LOW (ref 3.87–5.11)
RDW: 13.6 % (ref 11.5–15.5)
WBC: 10 10*3/uL (ref 4.0–10.5)

## 2017-05-30 LAB — SEDIMENTATION RATE: Sed Rate: 90 mm/hr — ABNORMAL HIGH (ref 0–22)

## 2017-05-30 LAB — C-REACTIVE PROTEIN: CRP: 8.3 mg/dL — ABNORMAL HIGH (ref <1.0)

## 2017-05-30 MED ORDER — LOPERAMIDE HCL 2 MG PO CAPS: 2.0000 mg | ORAL_CAPSULE | Freq: Four times a day (QID) | ORAL | 0 refills | Status: AC | PRN

## 2017-05-30 MED ORDER — METRONIDAZOLE 500 MG PO TABS: 500.0000 mg | ORAL_TABLET | Freq: Three times a day (TID) | ORAL | 0 refills | Status: AC

## 2017-05-30 MED ORDER — CIPROFLOXACIN HCL 500 MG PO TABS: 500.0000 mg | ORAL_TABLET | Freq: Two times a day (BID) | ORAL | 0 refills | Status: AC

## 2017-05-30 NOTE — Progress Notes (Signed)
Physical Therapy Discharge Patient Details Name: Alejandra Taylor MRN: [id number] DOB: 1960/11/24 Today's Date: 05/30/2017 Time:  -     Patient discharged from PT services secondary to goals met and no further PT needs identified.  Please see latest therapy progress note for current level of functioning and progress toward goals.    Progress and discharge plan discussed with patient and/or caregiver: patient reports that she is up ad lib and requires no assistance.  GP     [name] 05/30/2017, 9:43 AM Tresa Endo PT [phone]

## 2017-05-30 NOTE — Discharge Summary (Signed)
Physician Discharge Summary  Alejandra Taylor VZD:[id number] DOB: [birth year] DOA: 05/23/2017  PCP: Alejandra Marble D, NP  Admit date: 05/23/2017 Discharge date: 05/30/2017  Time spent: over 30 minutes  Recommendations for Outpatient Follow-up:  1. Follow up outpatient CBC/CMP 2. Needs outpatient follow up with GI   Discharge Diagnoses:  Principal Problem:   Colitis Active Problems:   Essential hypertension   Hypothyroidism   Hyperlipidemia   Tobacco abuse   UTI (urinary tract infection)   Depression with anxiety   AKI (acute kidney injury) ([place])   Hyponatremia   Discharge Condition: stable  Diet recommendation: heart healthy  Filed Weights   05/23/17 1514  Weight: 81.6 kg (180 lb)    History of present illness:  56 year old female with history of hypertension, dyslipidemia, diabetes mellitus, hypothyroidism, depression and anxiety, chronic back pain due to spinal stenosis, in the atrial cancer and status post hysterectomy in 2011, no chemotherapy or radiation, history of tobacco abuse. Patient presented with abdominal pain, diarrhea and dysuria for past 3 days prior to this admission, progressively worsening. Pain in the abdomen is in pelvis area, constant, 7 out of 10 in intensity. Patient also reports pain is sharp, nonradiating. She has had associated watery diarrhea on the day of the admission. No fevers or vomiting.  She was started on cipro/flagyl on admission.  She had negative C diff and GI pathogen panel.  ESR and CRP were obtained and elevated.  She was discharged on 12/6 with plans to continue imodium for control of her diarrhea and follow up with GI as an outpatient.  She was discharged with 4 additional days of cipro and flagyl.   Hospital Course:  Colitis/ Leukocytosis: CT scan of the abdomen and pelvis on admission showed transverse and descending inflammation (concerning for infectious or inflammatory colitis) She denies fam hx of IBD _0  f/u esr/crp ->  elevated _1  f/u GI pathogen panel -> negative Given her persistent frequent diarrhea which has not improved, will check GI pathogen panel and also start imodium.   Continue cipro/flagy Continue soft diet Discussed case with GI prior to discharge given elevated inflammatory markers and negative infectious studies and they noted patient could likely be f/u as outpatient.    E. Coli UTI Treated with cipro  Diabetes mellitus without complications: Continue sliding scale insulin good control.  Acute kidney injury: Improved with IVF  Hypothyroidism: No changes were made.  Dyslipidemia: Continue current regimen no changes were made.  Tobacco abuse: Has been counseled by previous providers  Hyponatremia: Improving  Hypokalemia: Resolved.  Falls: Occasionally, physical therapy was consulted, they recommended no PT follow-up.  Procedures:  none (i.e. Studies not automatically included, echos, thoracentesis, etc; not x-rays)  Consultations:  GI over phone  Discharge Exam: Vitals:   05/30/17 0800 05/30/17 1300  BP: (!) 142/66 (!) 148/63  Pulse: 86 86  Resp: 20 18  Temp: 98.2 F (36.8 C) 98.3 F (36.8 C)  SpO2: 99% 98%   Feeling better.  Wants to go home.  Had maybe 1-2 stools so far today.  General: No acute distress. Cardiovascular: Heart sounds show Alejandra Taylor regular rate, and rhythm. No gallops or rubs. No murmurs. No JVD. Lungs: Clear to auscultation bilaterally with good air movement. No rales, rhonchi or wheezes. Abdomen: Soft, nontender, nondistended with normal active bowel sounds. No masses. No hepatosplenomegaly. Neurological: Alert and oriented 3. Moves all extremities 4 with equal strength. Cranial nerves II through XII grossly intact. Skin: Warm and dry. No rashes or lesions.  Extremities: No clubbing or cyanosis. No edema. Psychiatric: Mood and affect are normal. Insight and judgment are appropriate.   Discharge Instructions   Discharge  Instructions    Call MD for:  extreme fatigue   Complete by:  As directed    Call MD for:  persistant dizziness or light-headedness   Complete by:  As directed    Call MD for:  persistant nausea and vomiting   Complete by:  As directed    Call MD for:  severe uncontrolled pain   Complete by:  As directed    Call MD for:  temperature >100.4   Complete by:  As directed    Diet - low sodium heart healthy   Complete by:  As directed    Diet - low sodium heart healthy   Complete by:  As directed    Discharge instructions   Complete by:  As directed    You were seen for colitis.  You were started on antibiotics and sent home with an additional 4 days of cipro/flagyl for Alejandra Taylor total of 12 days of antibiotics.  Titrate your imodium to help control your diarrhea.  I think it's important that you are seen by gastroenterology because of your elevated inflammatory markers as soon as possible.  Please have your PCP request your records from [facility] so they know what was done here and what the next steps will be.  Please follow up with your PCP within the next week.  Return for new, worsening, or recurrent symptoms.   Increase activity slowly   Complete by:  As directed    Increase activity slowly   Complete by:  As directed      Allergies as of 05/30/2017      Reactions   Other Hives   neoprene   Penicillins Rash      Medication List    TAKE these medications   albuterol 108 (90 Base) MCG/ACT inhaler Commonly known as:  PROVENTIL HFA;VENTOLIN HFA Inhale 2 puffs into the lungs every 6 (six) hours as needed for wheezing or shortness of breath.   CALCIUM 600+Taylor 600-400 MG-UNIT tablet Generic drug:  Calcium Carbonate-Vitamin Taylor Take 1 tablet by mouth daily.   ciprofloxacin 500 MG tablet Commonly known as:  CIPRO Take 1 tablet (500 mg total) by mouth 2 (two) times daily for 4 days.   clonazePAM 1 MG tablet Commonly known as:  KLONOPIN Take 1 mg by mouth 3 (three) times daily.   estazolam  2 MG tablet Commonly known as:  PROSOM TAKE 1 TABLET AT BEDTIME AS NEEDED FOR SLEEP MAY REPEAT ONCE AS NEEDED ONLY IF MID-AWAKENING   ezetimibe 10 MG tablet Commonly known as:  ZETIA Take 1 tablet (10 mg total) by mouth daily.   FIBER THERAPY 500 MG Tabs Generic drug:  Methylcellulose (Laxative) Take 1 tablet by mouth daily.   fish oil-omega-3 fatty acids 1000 MG capsule Take 1 g by mouth daily.   fluticasone 50 MCG/ACT nasal spray Commonly known as:  FLONASE Place 1 spray into both nostrils daily as needed for allergies.   folic acid 827 MCG tablet Commonly known as:  FOLVITE Take 400 mcg by mouth daily.   glucose blood test strip 1 each by Other route as needed. Use as instructed   HYDROcodone-acetaminophen 10-325 MG tablet Commonly known as:  NORCO TAKE 1 OR 2 TABLETS BY MOUTH EVERY 6 HOURS AS NEEDED FOR PAIN MUST LAST 30 DAYS   levothyroxine 125 MCG tablet Commonly known  as:  SYNTHROID, LEVOTHROID Take 1 tablet (125 mcg total) by mouth daily.   lisinopril-hydrochlorothiazide 20-12.5 MG tablet Commonly known as:  PRINZIDE,ZESTORETIC Take 1 tablet by mouth 2 (two) times daily.   loperamide 2 MG capsule Commonly known as:  IMODIUM Take 1 capsule (2 mg total) by mouth 4 (four) times daily as needed for up to 10 days for diarrhea or loose stools.   meloxicam 15 MG tablet Commonly known as:  MOBIC TAKE 1 TABLET BY MOUTH DAILY WITH FOOD AS NEEDED FOR PAIN   metFORMIN 500 MG tablet Commonly known as:  GLUCOPHAGE Take 1 tablet (500 mg total) by mouth 2 (two) times daily with [place]navan Covault meal.   methocarbamol 500 MG tablet Commonly known as:  ROBAXIN Take 500 mg by mouth 2 (two) times daily.   metroNIDAZOLE 500 MG tablet Commonly known as:  FLAGYL Take 1 tablet (500 mg total) by mouth every 8 (eight) hours for 4 days.   MULTIVITAMIN PO Take 1 tablet by mouth daily.   NARCAN 4 MG/0.1ML Liqd nasal spray kit Generic drug:  naloxone 1 AS NEEDED FOR OVERDOSE   pantoprazole  40 MG tablet Commonly known as:  PROTONIX Take 1 tablet (40 mg total) by mouth daily.   psyllium 58.6 % powder Commonly known as:  METAMUCIL Take 1 packet by mouth 3 (three) times daily.   QUEtiapine 300 MG 24 hr tablet Commonly known as:  SEROQUEL XR Take 300 mg by mouth at bedtime.   simvastatin 40 MG tablet Commonly known as:  ZOCOR Take 1 tablet (40 mg total) by mouth every evening.   SUPER B COMPLEX PO Take 1 tablet by mouth daily.   tiZANidine 4 MG capsule Commonly known as:  ZANAFLEX Take 4 mg by mouth. One tablet as needed orally once Amyrie Illingworth day at night for muscle spasm   topiramate 100 MG tablet Commonly known as:  TOPAMAX Take 2 tablets (200 mg total) by mouth at bedtime.   venlafaxine XR 150 MG 24 hr capsule Commonly known as:  EFFEXOR-XR Take 150 mg by mouth. Take 2 capsules every morning and 1 capsule at night   venlafaxine XR 75 MG 24 hr capsule Commonly known as:  EFFEXOR-XR Take 75 mg by mouth at bedtime.   vitamin C 500 MG tablet Commonly known as:  ASCORBIC ACID Take 500 mg by mouth daily.   vitamin E 200 UNIT capsule Take 200 Units by mouth daily.      Allergies  Allergen Reactions  . Other Hives    neoprene  . Penicillins Rash      The results of significant diagnostics from this hospitalization (including imaging, microbiology, ancillary and laboratory) are listed below for reference.    Significant Diagnostic Studies: Ct Abdomen Pelvis Wo Contrast  Result Date: 05/23/2017 CLINICAL DATA:  56 y/o F; diarrhea and lower abdominal pain for 3 days. History of endometrial carcinoma, diabetes, and hypertension. EXAM: CT ABDOMEN AND PELVIS WITHOUT CONTRAST TECHNIQUE: Multidetector CT imaging of the abdomen and pelvis was performed following the standard protocol without IV contrast. COMPARISON:  None. FINDINGS: Lower chest: Mild coronary artery calcific atherosclerosis. Subcentimeter calcified granuloma in the right lung base. Hepatobiliary: No  focal liver abnormality is seen. Status post cholecystectomy. No biliary dilatation. Pancreas: Unremarkable. No pancreatic ductal dilatation or surrounding inflammatory changes. Spleen: Normal in size without focal abnormality. Adrenals/Urinary Tract: Adrenal glands are unremarkable. Kidneys are normal, without renal calculi, focal lesion, or hydronephrosis. Bladder is unremarkable. Stomach/Bowel: Normal stomach and small bowel. Mild  diffuse wall thickening of transverse colon and descending colon with loss of normal features compatible with acute colitis. No evidence for perforation or abscess. Normal appendix. Vascular/Lymphatic: Moderate aortic atherosclerosis. No enlarged abdominal or pelvic lymph nodes. Reproductive: Status post hysterectomy. No adnexal masses. Other: No abdominal wall hernia or abnormality. No abdominopelvic ascites. Musculoskeletal: Chronic right anterolateral rib fractures. Grade 1 L4-5 anterolisthesis. No acute fracture. IMPRESSION: 1. Transverse and descending infectious or inflammatory colitis. No evidence for perforation or abscess. 2. Coronary and aortic calcific atherosclerosis. Electronically Signed   By: Kristine Garbe M.Taylor.   On: 05/23/2017 22:47   Dg Chest Port 1 View  Result Date: 05/25/2017 CLINICAL DATA:  Fall EXAM: PORTABLE CHEST 1 VIEW COMPARISON:  01/30/2017 FINDINGS: Low lung volumes. Heart and mediastinal contours are within normal limits. No focal opacities or effusions. No acute bony abnormality. IMPRESSION: No active cardiopulmonary disease. Electronically Signed   By: Rolm Baptise M.Taylor.   On: 05/25/2017 09:02    Microbiology: Recent Results (from the past 240 hour(s))  Culture, blood (x 2)     Status: None   Collection Time: 05/24/17 12:26 AM  Result Value Ref Range Status   Specimen Description BLOOD LEFT WRIST  Final   Special Requests   Final    BOTTLES DRAWN AEROBIC AND ANAEROBIC Blood Culture adequate volume   Culture   Final    NO GROWTH 5  DAYS Performed at [facility], [address], [place], [place] [address]    Report Status 05/29/2017 FINAL  Final  Culture, blood (x 2)     Status: None   Collection Time: 05/24/17 12:41 AM  Result Value Ref Range Status   Specimen Description BLOOD LEFT ANTECUBITAL  Final   Special Requests IN PEDIATRIC BOTTLE Blood Culture adequate volume  Final   Culture   Final    NO GROWTH 5 DAYS Performed at [facility], [place] [address], [place], [place] [address]    Report Status 05/29/2017 FINAL  Final  Urine Culture     Status: Abnormal   Collection Time: 05/24/17  5:30 AM  Result Value Ref Range Status   Specimen Description URINE, CLEAN CATCH  Final   Special Requests NONE  Final   Culture 40,000 COLONIES/mL ESCHERICHIA COLI ([place])  Final   Report Status 05/27/2017 FINAL  Final   Organism ID, Bacteria ESCHERICHIA COLI (Connelly Spruell)  Final      Susceptibility   Escherichia coli - MIC*    AMPICILLIN >=32 RESISTANT Resistant     CEFAZOLIN 8 SENSITIVE Sensitive     CEFTRIAXONE <=1 SENSITIVE Sensitive     CIPROFLOXACIN 0.5 SENSITIVE Sensitive     GENTAMICIN <=1 SENSITIVE Sensitive     IMIPENEM <=0.25 SENSITIVE Sensitive     NITROFURANTOIN <=16 SENSITIVE Sensitive     TRIMETH/SULFA <=20 SENSITIVE Sensitive     AMPICILLIN/SULBACTAM >=32 RESISTANT Resistant     PIP/TAZO <=4 SENSITIVE Sensitive     Extended ESBL NEGATIVE Sensitive     * 40,000 COLONIES/mL ESCHERICHIA COLI  C difficile quick scan w PCR reflex     Status: None   Collection Time: 05/24/17  7:58 AM  Result Value Ref Range Status   C Diff antigen NEGATIVE NEGATIVE Final   C Diff toxin NEGATIVE NEGATIVE Final   C Diff interpretation No C. difficile detected.  Final  Gastrointestinal Panel by PCR , Stool     Status: None   Collection Time: 05/29/17  6:26 PM  Result Value Ref  Range Status   Campylobacter species NOT DETECTED NOT DETECTED Final   Plesimonas shigelloides NOT DETECTED NOT DETECTED Final   Salmonella species NOT  DETECTED NOT DETECTED Final   Yersinia enterocolitica NOT DETECTED NOT DETECTED Final   Vibrio species NOT DETECTED NOT DETECTED Final   Vibrio cholerae NOT DETECTED NOT DETECTED Final   Enteroaggregative E coli (EAEC) NOT DETECTED NOT DETECTED Final   Enteropathogenic E coli (EPEC) NOT DETECTED NOT DETECTED Final   Enterotoxigenic E coli (ETEC) NOT DETECTED NOT DETECTED Final   Shiga like toxin producing E coli (STEC) NOT DETECTED NOT DETECTED Final   Shigella/Enteroinvasive E coli (EIEC) NOT DETECTED NOT DETECTED Final   Cryptosporidium NOT DETECTED NOT DETECTED Final   Cyclospora cayetanensis NOT DETECTED NOT DETECTED Final   Entamoeba histolytica NOT DETECTED NOT DETECTED Final   Giardia lamblia NOT DETECTED NOT DETECTED Final   Adenovirus F40/41 NOT DETECTED NOT DETECTED Final   Astrovirus NOT DETECTED NOT DETECTED Final   Norovirus GI/GII NOT DETECTED NOT DETECTED Final   Rotavirus Mihailo Sage NOT DETECTED NOT DETECTED Final   Sapovirus (I, II, IV, and V) NOT DETECTED NOT DETECTED Final     Labs: Basic Metabolic Panel: Recent Labs  Lab 05/26/17 0622 05/27/17 0551 05/28/17 0559 05/29/17 0646 05/30/17 0610  NA 130* 134* 133* 134* 137  K 3.5 3.2* 3.2* 3.9 4.1  CL 102 106 105 108 110  CO2 19* 19* 21* 19* 21*  GLUCOSE 114* 129* 121* 121* 113*  BUN 25* _0 CREATININE 1.85* 1.46* 1.23* 1.02* 0.92  CALCIUM 8.0* 8.0* 7.8* 7.8* 8.2*  MG  --  1.4*  --   --   --    Liver Function Tests: Recent Labs  Lab 05/23/17 2103 05/30/17 0610  AST 39 11*  ALT 24 9*  ALKPHOS 141* 77  BILITOT 0.7 0.2*  PROT 7.1 5.5*  ALBUMIN 3.3* 2.5*   Recent Labs  Lab 05/23/17 2103  LIPASE 17   No results for input(s): AMMONIA in the last 168 hours. CBC: Recent Labs  Lab 05/26/17 0622 05/27/17 0551 05/28/17 0559 05/29/17 0646 05/30/17 0610  WBC 11.0* 13.8* 16.4* 14.3* 10.0  NEUTROABS  --   --   --  10.5*  --   HGB 9.9* 10.3* 9.5* 8.9* 9.3*  HCT 28.4* 29.3* 27.2* 25.9* 27.2*  MCV 93.1  93.0 93.8 93.8 94.4  PLT 222 216 241 258 270   Cardiac Enzymes: No results for input(s): CKTOTAL, CKMB, CKMBINDEX, TROPONINI in the last 168 hours. BNP: BNP (last 3 results) No results for input(s): BNP in the last 8760 hours.  ProBNP (last 3 results) No results for input(s): PROBNP in the last 8760 hours.  CBG: Recent Labs  Lab 05/29/17 1118 05/29/17 1709 05/29/17 2140 05/30/17 0726 05/30/17 1127  GLUCAP 176* 95 129* 106* 96       Signed:  Fayrene Helper MD.  [facility] 05/30/2017, 3:26 PM

## 2017-05-30 NOTE — Progress Notes (Signed)
Discharge instructions were gone over with patient and husband. Patient IV site was removed. Aware of prescriptions that were sent to pharmacy.

## 2017-05-30 NOTE — Care Management Important Message (Signed)
Important Message  Patient Details  Name: Alejandra Taylor MRN: [id number] Date of Birth: 25-Jun-1961   Medicare Important Message Given:  Yes    Kerin Salen 05/30/2017, 10:20 AMImportant Message  Patient Details  Name: Alejandra Taylor MRN: [id number] Date of Birth: 1960-09-26   Medicare Important Message Given:  Yes    Kerin Salen 05/30/2017, 10:19 AM

## 2017-06-06 LAB — HM DIABETES EYE EXAM

## 2017-06-07 ENCOUNTER — Other Ambulatory Visit: Payer: Self-pay | Admitting: Pain Medicine

## 2017-06-07 DIAGNOSIS — M545 Low back pain: Secondary | ICD-10-CM
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
## 2017-06-10 ENCOUNTER — Ambulatory Visit (INDEPENDENT_AMBULATORY_CARE_PROVIDER_SITE_OTHER): Payer: BLUE CROSS/BLUE SHIELD | Admitting: Adult Health

## 2017-06-10 ENCOUNTER — Encounter: Payer: Self-pay | Admitting: Adult Health

## 2017-06-10 VITALS — BP 102/58 | HR 104 | Ht 67.25 in | Wt 191.4 lb

## 2017-06-10 DIAGNOSIS — G8929 Other chronic pain: Secondary | ICD-10-CM | POA: Diagnosis not present

## 2017-06-10 DIAGNOSIS — Z0000 Encounter for general adult medical examination without abnormal findings: Secondary | ICD-10-CM | POA: Diagnosis not present

## 2017-06-10 DIAGNOSIS — K529 Noninfective gastroenteritis and colitis, unspecified: Secondary | ICD-10-CM | POA: Diagnosis not present

## 2017-06-10 DIAGNOSIS — E871 Hypo-osmolality and hyponatremia: Secondary | ICD-10-CM | POA: Diagnosis not present

## 2017-06-10 NOTE — Assessment & Plan Note (Signed)
Follow BRAT diet and do not overeat. Referral to GI placed. Will call when lab results are available. Please continue all medications as directed. Please call clinic with any questions/concerns.

## 2017-06-10 NOTE — Patient Instructions (Addendum)
Colitis Colitis is inflammation of the colon. Colitis may last a short time (acute) or it may last a long time (chronic). What are the causes? This condition may be caused by:  Viruses.  Bacteria.  Reactions to medicine.  Certain autoimmune diseases, such as Crohn disease or ulcerative colitis.  What are the signs or symptoms? Symptoms of this condition include:  Diarrhea.  Passing bloody or tarry stool.  Pain.  Fever.  Vomiting.  Tiredness (fatigue).  Weight loss.  Bloating.  Sudden increase in abdominal pain.  Having fewer bowel movements than usual.  How is this diagnosed? This condition is diagnosed with a stool test or a blood test. You may also have other tests, including X-rays, a CT scan, or a colonoscopy. How is this treated? Treatment may include:  Resting the bowel. This involves not eating or drinking for a period of time.  Fluids that are given through an IV tube.  Medicine for pain and diarrhea.  Antibiotic medicines.  Cortisone medicines.  Surgery.  Follow these instructions at home: Eating and drinking  Follow instructions from your health care provider about eating or drinking restrictions.  Drink enough fluid to keep your urine clear or pale yellow.  Work with a dietitian to determine which foods cause your condition to flare up.  Avoid foods that cause flare-ups.  Eat a well-balanced diet. Medicines  Take over-the-counter and prescription medicines only as told by your health care provider.  If you were prescribed an antibiotic medicine, take it as told by your health care provider. Do not stop taking the antibiotic even if you start to feel better. General instructions  Keep all follow-up visits as told by your health care provider. This is important. Contact a health care provider if:  Your symptoms do not go away.  You develop new symptoms. Get help right away if:  You have a fever that does not go away with  treatment.  You develop chills.  You have extreme weakness, fainting, or dehydration.  You have repeated vomiting.  You develop severe pain in your abdomen.  You pass bloody or tarry stool. This information is not intended to replace advice given to you by your health care provider. Make sure you discuss any questions you have with your health care provider. Document Released: 07/19/2004 Document Revised: 11/17/2015 Document Reviewed: 10/04/2014 Elsevier Interactive Patient Education  2018 [employer].  Follow BRAT diet and do not overeat. Referral to GI placed. Will call when lab results are available. Please continue all medications as directed. Please keep follow-up Jan 2019. Please call clinic with any questions/concerns. FEEL BETTER!

## 2017-06-10 NOTE — Assessment & Plan Note (Addendum)
Has chronic med f/u 07/18/17 Follow BRAT diet, continue to abstain from tobacco use.

## 2017-06-10 NOTE — Assessment & Plan Note (Signed)
Slurred speech r/t narcotic/benzo use. She denies difficulties with ADLs/driving.  Advised that she should NOT be driving while taking controlled substances.

## 2017-06-10 NOTE — Assessment & Plan Note (Addendum)
05/25/17 Ref Range & Units 2wk ago  TSH 0.350 - 4.500 uIU/mL 2.977     CMP drawn today, Has has hyponatremia for years and denies any change/decline in mentation.

## 2017-06-11 ENCOUNTER — Other Ambulatory Visit: Payer: Self-pay

## 2017-06-11 DIAGNOSIS — N179 Acute kidney failure, unspecified: Secondary | ICD-10-CM

## 2017-06-11 LAB — COMPREHENSIVE METABOLIC PANEL
ALT: 5 IU/L (ref 0–32)
AST: 8 IU/L (ref 0–40)
Albumin/Globulin Ratio: 1.2 (ref 1.2–2.2)
Albumin: 3.2 g/dL — ABNORMAL LOW (ref 3.5–5.5)
Alkaline Phosphatase: 69 IU/L (ref 39–117)
BUN/Creatinine Ratio: 12 (ref 9–23)
BUN: 15 mg/dL (ref 6–24)
Bilirubin Total: <0.2 mg/dL (ref 0.0–1.2)
CO2: 24 mmol/L (ref 20–29)
Calcium: 8.4 mg/dL — ABNORMAL LOW (ref 8.7–10.2)
Chloride: 95 mmol/L — ABNORMAL LOW (ref 96–106)
Creatinine, Ser: 1.25 mg/dL — ABNORMAL HIGH (ref 0.57–1.00)
GFR calc Af Amer: 56 mL/min/{1.73_m2} — ABNORMAL LOW (ref >59)
GFR calc non Af Amer: 48 mL/min/{1.73_m2} — ABNORMAL LOW (ref >59)
Globulin, Total: 2.6 g/dL (ref 1.5–4.5)
Glucose: 118 mg/dL — ABNORMAL HIGH (ref 65–99)
Potassium: 3.7 mmol/L (ref 3.5–5.2)
Sodium: 135 mmol/L (ref 134–144)
Total Protein: 5.8 g/dL — ABNORMAL LOW (ref 6.0–8.5)

## 2017-06-11 LAB — CBC WITH DIFFERENTIAL/PLATELET
Basophils Absolute: 0.1 10*3/uL (ref 0.0–0.2)
Basos: 1 %
EOS (ABSOLUTE): 0.3 10*3/uL (ref 0.0–0.4)
Eos: 4 %
Hematocrit: 27.8 % — ABNORMAL LOW (ref 34.0–46.6)
Hemoglobin: 9.6 g/dL — ABNORMAL LOW (ref 11.1–15.9)
Immature Grans (Abs): 0 10*3/uL (ref 0.0–0.1)
Immature Granulocytes: 0 %
Lymphocytes Absolute: 1.7 10*3/uL (ref 0.7–3.1)
Lymphs: 21 %
MCH: 32.2 pg (ref 26.6–33.0)
MCHC: 34.5 g/dL (ref 31.5–35.7)
MCV: 93 fL (ref 79–97)
Monocytes Absolute: 1.5 10*3/uL — ABNORMAL HIGH (ref 0.1–0.9)
Monocytes: 19 %
Neutrophils Absolute: 4.2 10*3/uL (ref 1.4–7.0)
Neutrophils: 55 %
Platelets: 592 10*3/uL — ABNORMAL HIGH (ref 150–379)
RBC: 2.98 x10E6/uL — ABNORMAL LOW (ref 3.77–5.28)
RDW: 14 % (ref 12.3–15.4)
WBC: 7.8 10*3/uL (ref 3.4–10.8)

## 2017-06-19 ENCOUNTER — Encounter: Payer: Self-pay | Admitting: Internal Medicine

## 2017-06-20 ENCOUNTER — Telehealth: Payer: Self-pay | Admitting: Adult Health

## 2017-06-20 NOTE — Telephone Encounter (Signed)
Pt called states she did miss 2 calls from GI & when she finally cb they only had a Feb 6 date available-- pt wishes closer date with provider or a diff GI if necessary. --glh

## 2017-07-10 ENCOUNTER — Ambulatory Visit
Admission: RE | Admit: 2017-07-10 | Discharge: 2017-07-10 | Disposition: A | Discharge status: Home / Self Care | Payer: Medicare Other | Source: Ambulatory Visit | Attending: Pain Medicine | Admitting: Pain Medicine

## 2017-07-10 DIAGNOSIS — M545 Low back pain: Secondary | ICD-10-CM

## 2017-07-13 ENCOUNTER — Other Ambulatory Visit: Payer: Self-pay | Admitting: Adult Health

## 2017-07-17 DIAGNOSIS — E119 Type 2 diabetes mellitus without complications: Secondary | ICD-10-CM | POA: Insufficient documentation

## 2017-07-18 ENCOUNTER — Ambulatory Visit: Payer: BLUE CROSS/BLUE SHIELD | Admitting: Adult Health
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
## 2017-07-22 ENCOUNTER — Ambulatory Visit: Payer: Medicare Other | Admitting: Adult Health

## 2017-07-31 ENCOUNTER — Ambulatory Visit (INDEPENDENT_AMBULATORY_CARE_PROVIDER_SITE_OTHER): Payer: BLUE CROSS/BLUE SHIELD | Admitting: Internal Medicine

## 2017-07-31 ENCOUNTER — Encounter: Payer: Self-pay | Admitting: Internal Medicine

## 2017-07-31 VITALS — BP 108/70 | HR 74 | Ht 67.0 in | Wt 189.0 lb

## 2017-07-31 DIAGNOSIS — D638 Anemia in other chronic diseases classified elsewhere: Secondary | ICD-10-CM

## 2017-07-31 DIAGNOSIS — A09 Infectious gastroenteritis and colitis, unspecified: Secondary | ICD-10-CM

## 2017-07-31 DIAGNOSIS — K219 Gastro-esophageal reflux disease without esophagitis: Secondary | ICD-10-CM

## 2017-07-31 DIAGNOSIS — Z86010 Personal history of colonic polyps: Secondary | ICD-10-CM

## 2017-07-31 DIAGNOSIS — R935 Abnormal findings on diagnostic imaging of other abdominal regions, including retroperitoneum: Secondary | ICD-10-CM | POA: Diagnosis not present

## 2017-07-31 NOTE — Patient Instructions (Signed)
We will obtain a copy of your colonoscopy with Dr. Michail Sermon and then proceed from there

## 2017-08-01 ENCOUNTER — Encounter: Payer: Self-pay | Admitting: Internal Medicine

## 2017-08-01 NOTE — Progress Notes (Addendum)
HISTORY OF PRESENT ILLNESS:  Alejandra Taylor is a 57 y.o. disabled female with a history of endometrial cancer status post hysterectomy, hypertension, hyperlipidemia, hyperthyroidism, migraines, chronic back pain, PTSD and chronic tobacco abuse who is sent today by her primary care provider Alejandra Taylor with chief complaint of "colitis" and a history of colon polyps. Patient tells me that she underwent colonoscopy with Dr. Collene Mares approximately 12 years ago. Results unknown. For personal reasons she sought out a different GI provider, Dr. Michail Sermon at [facility]. She tells me that he performed colonoscopy and upper endoscopy approximately 7 years ago. Those reports have been requested. I did identify pathology in the medical record dated February 2011. This included hyperplastic colon polyp and gastritis. Patient tells me that she was told to follow-up in 5 years. The medical record states that she was discharged from their practice due to testing positive for marijuana. Patient states that this was secondary smoke. In any event, she was in her usual state of health until the end of November when she presented to the hospital with abdominal pain and diarrhea. She was dehydrated. She had a leukocytosis (18,000) and anemia after hydration. CT imaging (reviewed) revealed transverse and descending colitis. No additional abnormalities except for calcific vascular disease. Multiple stool studies were obtained and returned negative. She was treated with antibiotics and hydrated. She was discharged home after 8 days. Seen by her PCP in follow-up. He has had no recurrent problems since. She was told she might have ulcerative colitis. She also has a history of GERD for which she is on pantoprazole. This point she describes a prior history of going one week without a bowel movement then an abrupt bowel movement with loose stools. Since initiating regular fiber supplementation 3-4 years ago this problem has resolved. She is  status post cholecystectomy  REVIEW OF SYSTEMS:  All non-GI ROS negative unless otherwise stated in the history of present illness except for anxiety, arthritis, back pain, visual change, depression, headaches, sleeping problems, urinary leakage  Past Medical History:  Diagnosis Date  . Chronic back pain   . Colitis   . Diabetes mellitus   . Endometrial adenocarcinoma ([place]) 2011  . History of degenerative disc disease   . Hypercholesterolemia   . Hypertension   . Hyperthyroidism   . Migraines   . Post traumatic stress disorder   . Spinal stenosis     Past Surgical History:  Procedure Laterality Date  . ABDOMINAL HYSTERECTOMY    . CHOLECYSTECTOMY    . GANGLION CYST EXCISION     L wrist cyst removal with tendon rupture and repair  . ROBOTIC ASSISTED LAP VAGINAL HYSTERECTOMY  08/19/2009   BSO for endo ca  . TONSILLECTOMY      Social History Alejandra Taylor  reports that she has been smoking cigarettes.  She has a 30.00 pack-year smoking history. she has never used smokeless tobacco. She reports that she does not drink alcohol or use drugs.  family history includes Breast cancer in her cousin; Colon cancer in her maternal aunt; Diabetes in her father and paternal grandmother; Heart attack in her father; Hypertension in her father; Ovarian cancer in her cousin, maternal aunt, and paternal grandmother; Pancreatic cancer in her father; Stroke in her paternal grandmother.  Allergies  Allergen Reactions  . Other Hives    neoprene  . Penicillins Rash       PHYSICAL EXAMINATION: Vital signs: BP 108/70   Pulse 74   Ht 5\' 7"  (1.702 m)  Wt 189 lb (85.7 kg)   BMI 29.60 kg/m   Constitutional: generally well-appearing, no acute distress. Moves gingerly. Reeks of tobacco Psychiatric: alert and oriented x3, cooperative Eyes: extraocular movements intact, anicteric, conjunctiva pink Mouth: oral pharynx moist, no lesions Neck: supple no lymphadenopathy Cardiovascular: heart  regular rate and rhythm, no murmur Lungs: clear to auscultation bilaterally Abdomen: soft, nontender, nondistended, no obvious ascites, no peritoneal signs, normal bowel sounds, no organomegaly. Prior surgical incisions well-healed Rectal: Deferred until colonoscopy Extremities: no clubbing, cyanosis, or lower extremity edema bilaterally Skin: no lesions on visible extremities Neuro: No focal deficits. Cranial nerves intact  ASSESSMENT:  #1. Acute colitis with abnormal CT scan, diarrhea, and dehydration. Currently asymptomatic. Suspect acute infectious process #2. Anemia #3. GERD. Asymptomatic on PPI #4. History of colon polyps. Hyperplastic in computer #5. Multiple medical problems  PLAN:  #1. Colonoscopy to clarify CT abnormalities rule out chronic colitis, particular given anemia. #2. Obtain previous outside colonoscopy and upper endoscopy reports. We had the patient sign a release today #3. Hold diabetic medications the day of the procedure to avoid unwanted hypoglycemia #4. Ongoing general medical care with Alejandra Taylor  A copy of this consultation note has been sent to Alejandra Taylor  ADDENDUM 11/06/2017: Colonoscopy report dated 07/28/2009 received. Performed by Dr. Michail Sermon. The exam was said to be difficult secondary to looping and the quality of the bowel prep fair. Multiple diminutive polyps removed and returned hyperplastic. Exam was complete.
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
## 2017-08-08 ENCOUNTER — Ambulatory Visit (INDEPENDENT_AMBULATORY_CARE_PROVIDER_SITE_OTHER): Payer: BLUE CROSS/BLUE SHIELD | Admitting: Adult Health

## 2017-08-08 ENCOUNTER — Encounter: Payer: Self-pay | Admitting: Adult Health

## 2017-08-08 VITALS — BP 148/80 | HR 96 | Ht 67.01 in | Wt 184.0 lb

## 2017-08-08 DIAGNOSIS — G8929 Other chronic pain: Secondary | ICD-10-CM

## 2017-08-08 DIAGNOSIS — E039 Hypothyroidism, unspecified: Secondary | ICD-10-CM

## 2017-08-08 DIAGNOSIS — E119 Type 2 diabetes mellitus without complications: Secondary | ICD-10-CM

## 2017-08-08 DIAGNOSIS — G43901 Migraine, unspecified, not intractable, with status migrainosus: Secondary | ICD-10-CM

## 2017-08-08 DIAGNOSIS — E785 Hyperlipidemia, unspecified: Secondary | ICD-10-CM | POA: Diagnosis not present

## 2017-08-08 DIAGNOSIS — I10 Essential (primary) hypertension: Secondary | ICD-10-CM | POA: Diagnosis not present

## 2017-08-08 LAB — POCT GLYCOSYLATED HEMOGLOBIN (HGB A1C): Hemoglobin A1C: 5.4

## 2017-08-08 MED ORDER — SIMVASTATIN 40 MG PO TABS: 40.0000 mg | ORAL_TABLET | Freq: Every evening | ORAL | 0 refills | Status: DC

## 2017-08-08 MED ORDER — METFORMIN HCL 500 MG PO TABS: 500.0000 mg | ORAL_TABLET | Freq: Every day | ORAL | 0 refills | Status: DC

## 2017-08-08 MED ORDER — TOPIRAMATE 100 MG PO TABS: 200.0000 mg | ORAL_TABLET | Freq: Every day | ORAL | 1 refills | Status: DC

## 2017-08-08 MED ORDER — LEVOTHYROXINE SODIUM 125 MCG PO TABS: 125.0000 ug | ORAL_TABLET | Freq: Every day | ORAL | 1 refills | Status: DC

## 2017-08-08 MED ORDER — PANTOPRAZOLE SODIUM 40 MG PO TBEC: 40.0000 mg | DELAYED_RELEASE_TABLET | Freq: Every day | ORAL | 1 refills | Status: DC

## 2017-08-08 MED ORDER — EZETIMIBE 10 MG PO TABS: 10.0000 mg | ORAL_TABLET | Freq: Every day | ORAL | 1 refills | Status: DC

## 2017-08-08 NOTE — Assessment & Plan Note (Signed)
Currently taking simvastatin 40mg  daily, denies myalgia's

## 2017-08-08 NOTE — Assessment & Plan Note (Signed)
Last TSH 2.977 Continue Levothyroxine 168mcg daily

## 2017-08-08 NOTE — Assessment & Plan Note (Signed)
[place] Controlled Substance Database reviewed- Pain clinic is providing Hydrocodone 10mg , Clonazepam 1mg , Estazolam 2mg  She denies over sedation or constipation. She had injection therapy Monday and reports only experiencing pain relief for 24 hrs. She has monthly f/u at clinic

## 2017-08-08 NOTE — Patient Instructions (Addendum)
DASH Eating Plan DASH stands for "Dietary Approaches to Stop Hypertension." The DASH eating plan is a healthy eating plan that has been shown to reduce high blood pressure (hypertension). It may also reduce your risk for type 2 diabetes, heart disease, and stroke. The DASH eating plan may also help with weight loss. What are tips for following this plan? General guidelines  Avoid eating more than 2,300 mg (milligrams) of salt (sodium) a day. If you have hypertension, you may need to reduce your sodium intake to 1,500 mg a day.  Limit alcohol intake to no more than 1 drink a day for nonpregnant women and 2 drinks a day for men. One drink equals 12 oz of beer, 5 oz of wine, or 1 oz of hard liquor.  Work with your health care provider to maintain a healthy body weight or to lose weight. Ask what an ideal weight is for you.  Get at least 30 minutes of exercise that causes your heart to beat faster (aerobic exercise) most days of the week. Activities may include walking, swimming, or biking.  Work with your health care provider or diet and nutrition specialist (dietitian) to adjust your eating plan to your individual calorie needs. Reading food labels  Check food labels for the amount of sodium per serving. Choose foods with less than 5 percent of the Daily Value of sodium. Generally, foods with less than 300 mg of sodium per serving fit into this eating plan.  To find whole grains, look for the word "whole" as the first word in the ingredient list. Shopping  Buy products labeled as "low-sodium" or "no salt added."  Buy fresh foods. Avoid canned foods and premade or frozen meals. Cooking  Avoid adding salt when cooking. Use salt-free seasonings or herbs instead of table salt or sea salt. Check with your health care provider or pharmacist before using salt substitutes.  Do not fry foods. Cook foods using healthy methods such as baking, boiling, grilling, and broiling instead.  Cook with  heart-healthy oils, such as olive, canola, soybean, or sunflower oil. Meal planning   Eat a balanced diet that includes: ? 5 or more servings of fruits and vegetables each day. At each meal, try to fill half of your plate with fruits and vegetables. ? Up to 6-8 servings of whole grains each day. ? Less than 6 oz of lean meat, poultry, or fish each day. A 3-oz serving of meat is about the same size as a deck of cards. One egg equals 1 oz. ? 2 servings of low-fat dairy each day. ? A serving of nuts, seeds, or beans 5 times each week. ? Heart-healthy fats. Healthy fats called Omega-3 fatty acids are found in foods such as flaxseeds and coldwater fish, like sardines, salmon, and mackerel.  Limit how much you eat of the following: ? Canned or prepackaged foods. ? Food that is high in trans fat, such as fried foods. ? Food that is high in saturated fat, such as fatty meat. ? Sweets, desserts, sugary drinks, and other foods with added sugar. ? Full-fat dairy products.  Do not salt foods before eating.  Try to eat at least 2 vegetarian meals each week.  Eat more home-cooked food and less restaurant, buffet, and fast food.  When eating at a restaurant, ask that your food be prepared with less salt or no salt, if possible. What foods are recommended? The items listed may not be a complete list. Talk with your dietitian about what   dietary choices are best for you. Grains Whole-grain or whole-wheat bread. Whole-grain or whole-wheat pasta. Brown rice. Oatmeal. Quinoa. Bulgur. Whole-grain and low-sodium cereals. Pita bread. Low-fat, low-sodium crackers. Whole-wheat flour tortillas. Vegetables Fresh or frozen vegetables (raw, steamed, roasted, or grilled). Low-sodium or reduced-sodium tomato and vegetable juice. Low-sodium or reduced-sodium tomato sauce and tomato paste. Low-sodium or reduced-sodium canned vegetables. Fruits All fresh, dried, or frozen fruit. Canned fruit in natural juice (without  added sugar). Meat and other protein foods Skinless chicken or turkey. Ground chicken or turkey. Pork with fat trimmed off. Fish and seafood. Egg whites. Dried beans, peas, or lentils. Unsalted nuts, nut butters, and seeds. Unsalted canned beans. Lean cuts of beef with fat trimmed off. Low-sodium, lean deli meat. Dairy Low-fat (1%) or fat-free (skim) milk. Fat-free, low-fat, or reduced-fat cheeses. Nonfat, low-sodium ricotta or cottage cheese. Low-fat or nonfat yogurt. Low-fat, low-sodium cheese. Fats and oils Soft margarine without trans fats. Vegetable oil. Low-fat, reduced-fat, or light mayonnaise and salad dressings (reduced-sodium). Canola, safflower, olive, soybean, and sunflower oils. Avocado. Seasoning and other foods Herbs. Spices. Seasoning mixes without salt. Unsalted popcorn and pretzels. Fat-free sweets. What foods are not recommended? The items listed may not be a complete list. Talk with your dietitian about what dietary choices are best for you. Grains Baked goods made with fat, such as croissants, muffins, or some breads. Dry pasta or rice meal packs. Vegetables Creamed or fried vegetables. Vegetables in a cheese sauce. Regular canned vegetables (not low-sodium or reduced-sodium). Regular canned tomato sauce and paste (not low-sodium or reduced-sodium). Regular tomato and vegetable juice (not low-sodium or reduced-sodium). Pickles. Olives. Fruits Canned fruit in a light or heavy syrup. Fried fruit. Fruit in cream or butter sauce. Meat and other protein foods Fatty cuts of meat. Ribs. Fried meat. Bacon. Sausage. Bologna and other processed lunch meats. Salami. Fatback. Hotdogs. Bratwurst. Salted nuts and seeds. Canned beans with added salt. Canned or smoked fish. Whole eggs or egg yolks. Chicken or turkey with skin. Dairy Whole or 2% milk, cream, and half-and-half. Whole or full-fat cream cheese. Whole-fat or sweetened yogurt. Full-fat cheese. Nondairy creamers. Whipped toppings.  Processed cheese and cheese spreads. Fats and oils Butter. Stick margarine. Lard. Shortening. Ghee. Bacon fat. Tropical oils, such as coconut, palm kernel, or palm oil. Seasoning and other foods Salted popcorn and pretzels. Onion salt, garlic salt, seasoned salt, table salt, and sea salt. Worcestershire sauce. Tartar sauce. Barbecue sauce. Teriyaki sauce. Soy sauce, including reduced-sodium. Steak sauce. Canned and packaged gravies. Fish sauce. Oyster sauce. Cocktail sauce. Horseradish that you find on the shelf. Ketchup. Mustard. Meat flavorings and tenderizers. Bouillon cubes. Hot sauce and Tabasco sauce. Premade or packaged marinades. Premade or packaged taco seasonings. Relishes. Regular salad dressings. Where to find more information:  National Heart, Lung, and Blood Institute: www.nhlbi.nih.gov  American Heart Association: www.heart.org Summary  The DASH eating plan is a healthy eating plan that has been shown to reduce high blood pressure (hypertension). It may also reduce your risk for type 2 diabetes, heart disease, and stroke.  With the DASH eating plan, you should limit salt (sodium) intake to 2,300 mg a day. If you have hypertension, you may need to reduce your sodium intake to 1,500 mg a day.  When on the DASH eating plan, aim to eat more fresh fruits and vegetables, whole grains, lean proteins, low-fat dairy, and heart-healthy fats.  Work with your health care provider or diet and nutrition specialist (dietitian) to adjust your eating plan to your individual   calorie needs. This information is not intended to replace advice given to you by your health care provider. Make sure you discuss any questions you have with your health care provider. Document Released: 05/31/2011 Document Revised: 06/04/2016 Document Reviewed: 06/04/2016 Elsevier Interactive Patient Education  2018 [employer].    Chronic Back Pain When back pain lasts longer than 3 months, it is called chronic back  pain.The cause of your back pain may not be known. Some common causes include:  Wear and tear (degenerative disease) of the bones, ligaments, or disks in your back.  Inflammation and stiffness in your back (arthritis).  People who have chronic back pain often go through certain periods in which the pain is more intense (flare-ups). Many people can learn to manage the pain with home care. Follow these instructions at home: Pay attention to any changes in your symptoms. Take these actions to help with your pain: Activity  Avoid bending and activities that make the problem worse.  Do not sit or stand in one place for long periods of time.  Take brief periods of rest throughout the day. This will reduce your pain. Resting in a lying or standing position is usually better than sitting to rest.  When you are resting for longer periods, mix in some mild activity or stretching between periods of rest. This will help to prevent stiffness and pain.  Get regular exercise. Ask your health care provider what activities are safe for you.  Do not lift anything that is heavier than 10 lb (4.5 kg). Always use proper lifting technique, which includes: ? Bending your knees. ? Keeping the load close to your body. ? Avoiding twisting. Managing pain  If directed, apply ice to the painful area. Your health care provider may recommend applying ice during the first 24-48 hours after a flare-up begins. ? Put ice in a plastic bag. ? Place a towel between your skin and the bag. ? Leave the ice on for 20 minutes, 2-3 times per day.  After icing, apply heat to the affected area as often as told by your health care provider. Use the heat source that your health care provider recommends, such as a moist heat pack or a heating pad. ? Place a towel between your skin and the heat source. ? Leave the heat on for 20-30 minutes. ? Remove the heat if your skin turns bright red. This is especially important if you are  unable to feel pain, heat, or cold. You may have a greater risk of getting burned.  Try soaking in a warm tub.  Take over-the-counter and prescription medicines only as told by your health care provider.  Keep all follow-up visits as told by your health care provider. This is important. Contact a health care provider if:  You have pain that is not relieved with rest or medicine. Get help right away if:  You have weakness or numbness in one or both of your legs or feet.  You have trouble controlling your bladder or your bowels.  You have nausea or vomiting.  You have pain in your abdomen.  You have shortness of breath or you faint. This information is not intended to replace advice given to you by your health care provider. Make sure you discuss any questions you have with your health care provider. Document Released: 07/19/2004 Document Revised: 10/20/2015 Document Reviewed: 11/29/2014 Elsevier Interactive Patient Education  [employer].  Since you A1c well below 6 the last 2 years, reduce Metformin  500mg  to just once with breakfast.  Do not take evening dose. Continue all other medications as directed. Increase water intake and reduce soda pop. Continue to walk/stretch as tolerated. We will call you when lab results are available. Please continue with [facility] and GI specialist as directed. Reduce-stop tobacco use-YOU CAN DO IT! Please schedule complete physical in 4 months. NICE TO SEE YOU!

## 2017-08-08 NOTE — Assessment & Plan Note (Addendum)
Lab Results  Component Value Date   HGBA1C 5.4 08/08/2017   HGBA1C 5.2 04/20/2016   Reduced Metformin 500mg  from BID to once daily with breakfast When she does check BS in AM it run 100-130s, she denies episodes of hypoglycemia Continue to watch CHO/sugar and move as often possible Foot exam normal

## 2017-08-08 NOTE — Assessment & Plan Note (Signed)
Well controlled on Topiramate 100mg  2 tabs QHS- she has been taking this dosage in evening for years

## 2017-08-08 NOTE — Assessment & Plan Note (Signed)
BP slightly above goal 148/80, HR 96 Continue Lisinopril/HCTZ 20/12.5mg  BID She denies acute cardiac sx's Encouraged to reduce-stop tobacco use

## 2017-08-09 LAB — COMPREHENSIVE METABOLIC PANEL
ALT: 14 IU/L (ref 0–32)
AST: 12 IU/L (ref 0–40)
Albumin/Globulin Ratio: 1.6 (ref 1.2–2.2)
Albumin: 4.3 g/dL (ref 3.5–5.5)
Alkaline Phosphatase: 122 IU/L — ABNORMAL HIGH (ref 39–117)
BUN/Creatinine Ratio: 11 (ref 9–23)
BUN: 13 mg/dL (ref 6–24)
Bilirubin Total: <0.2 mg/dL (ref 0.0–1.2)
CO2: 22 mmol/L (ref 20–29)
Calcium: 9.7 mg/dL (ref 8.7–10.2)
Chloride: 96 mmol/L (ref 96–106)
Creatinine, Ser: 1.16 mg/dL — ABNORMAL HIGH (ref 0.57–1.00)
GFR calc Af Amer: 61 mL/min/{1.73_m2} (ref >59)
GFR calc non Af Amer: 53 mL/min/{1.73_m2} — ABNORMAL LOW (ref >59)
Globulin, Total: 2.7 g/dL (ref 1.5–4.5)
Glucose: 133 mg/dL — ABNORMAL HIGH (ref 65–99)
Potassium: 5.3 mmol/L — ABNORMAL HIGH (ref 3.5–5.2)
Sodium: 137 mmol/L (ref 134–144)
Total Protein: 7 g/dL (ref 6.0–8.5)

## 2017-08-27 ENCOUNTER — Other Ambulatory Visit: Payer: Self-pay | Admitting: Adult Health

## 2017-08-28 ENCOUNTER — Other Ambulatory Visit: Payer: Self-pay | Admitting: Adult Health

## 2017-08-28 MED ORDER — TIZANIDINE HCL 4 MG PO CAPS: ORAL_CAPSULE | ORAL | 0 refills | Status: DC

## 2017-08-28 NOTE — Telephone Encounter (Signed)
Pt called states she forgot to list this medicine & ask for a refill.   --- Rx prescribed by prior PCP (not Valetta Fuller).  --Pt ask provider call in Rx refill of:  tiZANidine (ZANAFLEX) 4 MG capsule [[id number]]  Order Details  Dose: 4 mg Route: Oral Frequency: --  Dispense Quantity: -- Refills: -- Fills remaining: --        Sig: Take 4 mg by mouth. One tablet as needed orally once a day at night for muscle spasm          Pt uses:    Preferred Pharmacies      [id number] [address], [place]. [phone] (Phone) [phone] (Fax)   ---glh

## 2017-08-28 NOTE — Telephone Encounter (Signed)
We have not prescribed these medications for the patient previously.  Please review and refill if appropriate.  T. Kynadie Yaun, CMA  

## 2017-08-29 LAB — HM MAMMOGRAPHY

## 2017-09-28 ENCOUNTER — Other Ambulatory Visit: Payer: Self-pay | Admitting: Adult Health

## 2017-09-30 NOTE — Telephone Encounter (Signed)
Please review and refill if appropriate.  T. Nelson, CMA  

## 2017-09-30 NOTE — Telephone Encounter (Signed)
Good Afternoon Kenney Houseman, Ms. Moster is managed by [facility]. She should request Tizanidine 4mg  from them. Thanks! Valetta Fuller

## 2017-10-02 ENCOUNTER — Other Ambulatory Visit: Payer: Self-pay | Admitting: Adult Health

## 2017-10-07 ENCOUNTER — Other Ambulatory Visit: Payer: Self-pay

## 2017-10-08 MED ORDER — TIZANIDINE HCL 4 MG PO CAPS: ORAL_CAPSULE | ORAL | 0 refills | Status: DC

## 2017-11-04 ENCOUNTER — Other Ambulatory Visit: Payer: Self-pay | Admitting: Adult Health

## 2017-11-06 ENCOUNTER — Other Ambulatory Visit: Payer: Self-pay | Admitting: Adult Health

## 2017-11-06 NOTE — Telephone Encounter (Signed)
Pt informed.  Pt expressed understanding and is agreeable.  T. Shinichi Anguiano, CMA  

## 2017-11-06 NOTE — Telephone Encounter (Signed)
Good Morning Tonya, I will refill only more one time, then she needs to request this rx from her pain clinic. Can you please call and tell her that. Thanks! Valetta Fuller

## 2017-11-06 NOTE — Telephone Encounter (Signed)
I am unsure if refill is appropriate.  Please review and refill if appropriate.  Charyl Bigger, CMA

## 2017-12-03 ENCOUNTER — Other Ambulatory Visit: Payer: Self-pay | Admitting: Adult Health

## 2017-12-10 NOTE — Progress Notes (Signed)
Subjective:    Patient ID: Alejandra Taylor, female    DOB: 1960/07/31, 57 y.o.   MRN: [id number]  HPI :  Ms. Trivedi is here for CPE She reports medication compliance, denies SE She has a few complaints: 1) Chronic constipation, she reports 4-6 days in between BMs. She denies hematochezia or abdominal pain.  She is existing pt with [facility].  She is past due for her colonoscopy. 2) urinary incontinence and dribbling.  She reports pendulous abdomen that makes it difficult to void completely. 3) Increase in depression due to running out of anti-depressants, she is now titrating back up on venlafaxine, currently on 100m  The 10-year ASCVD risk score (Mikey BussingDC JBrooke Bonito, et al., 2013) is: 7%*   Values used to calculate the score:     Age: [age]years     Sex: Female     Is Non-Hispanic African American: No     Diabetic: Yes     Tobacco smoker: Yes     Systolic Blood Pressure: 1500mmHg     Is BP treated: Yes     HDL Cholesterol: 80 mg/dL*     Total Cholesterol: 174 mg/dL*     * - Cholesterol units were assumed for this score calculation   Lab Results  Component Value Date   HGBA1C 7.0 (A) 12/11/2017   HGBA1C 5.4 08/08/2017   HGBA1C 5.2 04/20/2016   Healthcare Maintenance: PAP- due 10/19- will be completed with OB/GYN Mammogram- due 08/2018 Colonoscopy- Past Due- advised to contact her current GI LDCT- not indicated, she has <25 pack year hx Hep C-screening, drawn today  Patient Care Team    Relationship Specialty Notifications Start End  DEsaw Grandchild NP PCP - General Family Medicine  04/17/17   DMelrose Nakayama MD Consulting Physician Orthopedic Surgery  04/18/17   DJuluis [facility]  08/08/17     Patient Active Problem List   Diagnosis Date Noted  . Urinary incontinence 12/11/2017  . Constipation 12/11/2017  . Need for hepatitis C screening test 12/11/2017  . Diabetes mellitus without complication ([facility] 093/[id number] . Healthcare maintenance 06/10/2017  . Colitis  05/24/2017  . Tobacco abuse 05/24/2017  . UTI (urinary tract infection) 05/24/2017  . Depression with anxiety 05/24/2017  . [place] ([place] kidney injury) ([place] 05/24/2017  . Hyponatremia 05/24/2017  . Migraine with status migrainosus, not intractable 04/17/2017  . Other chronic pain 04/17/2017  . Essential hypertension 04/17/2017  . PTSD (post-traumatic stress disorder) 04/17/2017  . Hypothyroidism 04/17/2017  . Hyperlipidemia 04/17/2017  . [place]rial cancer [place] 01/22/2012     Past Medical History:  Diagnosis Date  . Chronic back pain   . Colitis   . Diabetes mellitus   . 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 History of degenerative disc disease   . Hypercholesterolemia   . Hypertension   . Hyperthyroidism   . Migraines   . Post traumatic stress disorder   . Spinal stenosis      Past Surgical History:  Procedure Laterality Date  . ABDOMINAL HYSTERECTOMY    . CHOLECYSTECTOMY    . GANGLION CYST EXCISION     L wrist cyst removal with tendon rupture and repair  . ROBOTIC ASSISTED LAP VAGINAL HYSTERECTOMY  08/19/2009   BSO for endo ca  . TONSILLECTOMY       Family History  Problem Relation Age of Onset  . Hypertension Father   . Diabetes Father   . Heart attack Father   . Pancreatic cancer Father   .  Colon cancer Maternal Aunt   . Ovarian cancer Maternal Aunt   . Ovarian cancer Paternal Grandmother   . Stroke Paternal Grandmother   . Diabetes Paternal Grandmother   . Ovarian cancer Cousin   . Breast cancer Cousin      Social History   Substance and Sexual Activity  Drug Use No     Social History   Substance and Sexual Activity  Alcohol Use No     Social History   Tobacco Use  Smoking Status Current Every Day Smoker  . Packs/day: 1.00  . Years: 30.00  . Pack years: 30.00  . Types: Cigarettes  Smokeless Tobacco Never Used     Outpatient Encounter Medications as of 12/11/2017  Medication Sig Note  . albuterol (PROVENTIL HFA;VENTOLIN HFA)  108 (90 BASE) MCG/ACT inhaler Inhale 2 puffs into the lungs every 6 (six) hours as needed for wheezing or shortness of breath.    . clonazePAM (KLONOPIN) 1 MG tablet Take 1 mg by mouth 3 (three) times daily. 04/07/2015: Received from: External Pharmacy  . estazolam (PROSOM) 2 MG tablet TAKE 1 TABLET AT BEDTIME AS NEEDED FOR SLEEP MAY REPEAT ONCE AS NEEDED ONLY IF MID-AWAKENING 04/07/2015: Received from: External Pharmacy  . ezetimibe (ZETIA) 10 MG tablet Take 1 tablet (10 mg total) by mouth daily.   Marland Kitchen glucose blood test strip 1 each by Other route as needed. Use as instructed   . HYDROcodone-acetaminophen (NORCO) 10-325 MG tablet TAKE 1 OR 2 TABLETS BY MOUTH EVERY 6 HOURS AS NEEDED FOR PAIN MUST LAST 30 DAYS 04/07/2015: Received from: External Pharmacy  . levothyroxine (SYNTHROID, LEVOTHROID) 125 MCG tablet Take 1 tablet (125 mcg total) by mouth daily.   Marland Kitchen linaclotide (LINZESS) 72 MCG capsule Take 1 capsule (72 mcg total) by mouth daily before breakfast.   . lisinopril-hydrochlorothiazide (PRINZIDE,ZESTORETIC) 20-12.5 MG tablet Take 1 tablet by mouth 2 (two) times daily.   Marland Kitchen loperamide (IMODIUM) 2 MG capsule Take 2 mg by mouth as needed for diarrhea or loose stools.   . meloxicam (MOBIC) 15 MG tablet TAKE 1 TABLET BY MOUTH DAILY WITH FOOD AS NEEDED FOR PAIN   . metFORMIN (GLUCOPHAGE) 500 MG tablet Take 1 tablet (500 mg total) by mouth 2 (two) times daily with a meal.   . methocarbamol (ROBAXIN) 500 MG tablet Take 500 mg by mouth 2 (two) times daily.   Marland Kitchen NARCAN 4 MG/0.1ML LIQD nasal spray kit 1 AS NEEDED FOR OVERDOSE   . pantoprazole (PROTONIX) 40 MG tablet Take 1 tablet (40 mg total) by mouth daily.   . QUEtiapine (SEROQUEL XR) 300 MG 24 hr tablet Take 300 mg by mouth at bedtime.   Marland Kitchen QUEtiapine (SEROQUEL) 300 MG tablet TAKE 1 TABLET BY MOUTH AT NIGHT 1 HOUR BEFORE BED   . simvastatin (ZOCOR) 40 MG tablet TAKE 1 TABLET BY MOUTH EVERY DAY IN THE EVENING   . tiZANidine (ZANAFLEX) 4 MG capsule Take one  tablet PRN once a day for muscle spasm   . topiramate (TOPAMAX) 100 MG tablet Take 2 tablets (200 mg total) by mouth at bedtime.   Marland Kitchen venlafaxine XR (EFFEXOR-XR) 150 MG 24 hr capsule Take 150 mg by mouth. Take 2 capsules every morning and 1 capsule at night   . [DISCONTINUED] ezetimibe (ZETIA) 10 MG tablet Take 1 tablet (10 mg total) by mouth daily.   . [DISCONTINUED] levothyroxine (SYNTHROID, LEVOTHROID) 125 MCG tablet Take 1 tablet (125 mcg total) by mouth daily.   . [DISCONTINUED] lisinopril-hydrochlorothiazide ([place])  20-12.5 MG tablet Take 1 tablet by mouth 2 (two) times daily.   . [DISCONTINUED] metFORMIN (GLUCOPHAGE) 500 MG tablet Take 1 tablet (500 mg total) by mouth daily with breakfast.   . [DISCONTINUED] simvastatin (ZOCOR) 40 MG tablet TAKE 1 TABLET BY MOUTH EVERY DAY IN THE EVENING   . [DISCONTINUED] tiZANidine (ZANAFLEX) 4 MG capsule ONE TABLET AS NEEDED ORALLY ONCE A DAY AT NIGHT FOR MUSCLE SPASM    No facility-administered encounter medications on file as of 12/11/2017.     Allergies: Other and Penicillins  Body mass index is 31.16 kg/m.  Blood pressure 112/71, pulse 82, height 5' 7.01" (1.702 m), weight 199 lb (90.3 kg), SpO2 99 %.  Review of Systems  Constitutional: Positive for fatigue. Negative for activity change, appetite change, chills, diaphoresis, fever and unexpected weight change.  Eyes: Negative for visual disturbance.  Respiratory: Negative for cough, chest tightness, shortness of breath, wheezing and stridor.   Cardiovascular: Negative for chest pain, palpitations and leg swelling.  Gastrointestinal: Positive for constipation. Negative for abdominal distention, abdominal pain, blood in stool, diarrhea, nausea and vomiting.  Genitourinary: Positive for difficulty urinating. Negative for flank pain, frequency and urgency.  Musculoskeletal: Positive for arthralgias, back pain, gait problem, joint swelling, myalgias, neck pain and neck stiffness.   Skin: Positive for color change. Negative for pallor, rash and wound.  Neurological: Negative for dizziness and headaches.  Hematological: Does not bruise/bleed easily.  Psychiatric/Behavioral: Positive for dysphoric mood. Negative for behavioral problems, decreased concentration, hallucinations, self-injury, sleep disturbance and suicidal ideas. The patient is nervous/anxious. The patient is not hyperactive.        Objective:   Physical Exam  Constitutional: She is oriented to person, place, and time. She appears well-developed and well-nourished. No distress.  HENT:  Head: Normocephalic and atraumatic.  Right Ear: External ear normal. Tympanic membrane is not erythematous and not bulging. No decreased hearing is noted.  Left Ear: External ear normal. Tympanic membrane is not erythematous and not bulging. No decreased hearing is noted.  Nose: No mucosal edema. Right sinus exhibits no maxillary sinus tenderness and no frontal sinus tenderness. Left sinus exhibits no maxillary sinus tenderness and no frontal sinus tenderness.  Mouth/Throat: Oropharynx is clear and moist and mucous membranes are normal. Abnormal dentition. Dental caries present. No oropharyngeal exudate, posterior oropharyngeal edema, posterior oropharyngeal erythema or tonsillar abscesses.  Eyes: Pupils are equal, round, and reactive to light. Conjunctivae and EOM are normal.  Neck: Normal range of motion. Neck supple.  Cardiovascular: Normal rate, regular rhythm, normal heart sounds and intact distal pulses.  No murmur heard. Pulmonary/Chest: Effort normal and breath sounds normal. No stridor. No respiratory distress. She has no wheezes. She exhibits no tenderness.  Abdominal: Soft. Bowel sounds are normal. She exhibits no distension and no mass. There is no tenderness. There is no rebound and no guarding. No hernia.  Pendulous abdomen  Genitourinary:  Genitourinary Comments: Declined pelvic examination- will complete with  her OB/GYN this fall   Musculoskeletal: Normal range of motion.  Lymphadenopathy:    She has no cervical adenopathy.  Neurological: She is alert and oriented to person, place, and time. Coordination normal.  Skin: Skin is warm and dry. Capillary refill takes less than 2 seconds. No rash noted. She is not diaphoretic. No erythema. No pallor.  Psychiatric: She has a normal mood and affect. Her behavior is normal. Judgment and thought content normal.  Nursing note and vitals reviewed.     Assessment & Plan:  1. Diabetes mellitus without complication ([place])   2. Urinary incontinence, unspecified type   3. Hyponatremia   4. Encounter for screening colonoscopy   5. Constipation, unspecified constipation type   6. Essential hypertension   7. Need for hepatitis C screening test   8. Depression with anxiety     Diabetes mellitus without complication ([place]) Lab Results  Component Value Date   HGBA1C 7.0 (A) 12/11/2017   HGBA1C 5.4 08/08/2017   HGBA1C 5.2 04/20/2016   Abnormal Microalbumin- already on ARB and BP at goal today Increased Metformin 564m from QD to BID Reduce sugar/CHO intake and increase regular movement F/u in 3 months  Essential hypertension BP at goal 112/71, HR 82 She has been on Prinzide 20/12.582mBID for years   Constipation Increase water and diet rich in fruits/vegetables Started on once daily linaclotide 72 mcg  F/u with established GI  Need for hepatitis C screening test Lab completed today  Urinary incontinence Referral to Urology placed    FOLLOW-UP:  Return in about 3 months (around 03/13/2018) for Regular Follow Up, HTN, Diabetes.

## 2017-12-11 ENCOUNTER — Encounter: Payer: Self-pay | Admitting: Adult Health

## 2017-12-11 ENCOUNTER — Ambulatory Visit (INDEPENDENT_AMBULATORY_CARE_PROVIDER_SITE_OTHER): Payer: BLUE CROSS/BLUE SHIELD | Admitting: Adult Health

## 2017-12-11 VITALS — BP 112/71 | HR 82 | Ht 67.01 in | Wt 199.0 lb

## 2017-12-11 DIAGNOSIS — R32 Unspecified urinary incontinence: Secondary | ICD-10-CM | POA: Diagnosis not present

## 2017-12-11 DIAGNOSIS — K5903 Drug induced constipation: Secondary | ICD-10-CM | POA: Insufficient documentation

## 2017-12-11 DIAGNOSIS — Z1159 Encounter for screening for other viral diseases: Secondary | ICD-10-CM | POA: Diagnosis not present

## 2017-12-11 DIAGNOSIS — E119 Type 2 diabetes mellitus without complications: Secondary | ICD-10-CM | POA: Diagnosis not present

## 2017-12-11 DIAGNOSIS — Z1211 Encounter for screening for malignant neoplasm of colon: Secondary | ICD-10-CM | POA: Diagnosis not present

## 2017-12-11 DIAGNOSIS — E871 Hypo-osmolality and hyponatremia: Secondary | ICD-10-CM

## 2017-12-11 DIAGNOSIS — F418 Other specified anxiety disorders: Secondary | ICD-10-CM

## 2017-12-11 DIAGNOSIS — K5900 Constipation, unspecified: Secondary | ICD-10-CM | POA: Diagnosis not present

## 2017-12-11 DIAGNOSIS — I10 Essential (primary) hypertension: Secondary | ICD-10-CM

## 2017-12-11 LAB — POCT UA - MICROALBUMIN

## 2017-12-11 LAB — POCT GLYCOSYLATED HEMOGLOBIN (HGB A1C): Hemoglobin A1C: 7 % — AB (ref 4.0–5.6)

## 2017-12-11 MED ORDER — TIZANIDINE HCL 4 MG PO CAPS: ORAL_CAPSULE | ORAL | 0 refills | Status: DC

## 2017-12-11 MED ORDER — LISINOPRIL-HYDROCHLOROTHIAZIDE 20-12.5 MG PO TABS: 1.0000 | ORAL_TABLET | Freq: Two times a day (BID) | ORAL | 0 refills | Status: DC

## 2017-12-11 MED ORDER — METFORMIN HCL 500 MG PO TABS: 500.0000 mg | ORAL_TABLET | Freq: Two times a day (BID) | ORAL | 2 refills | Status: DC

## 2017-12-11 MED ORDER — EZETIMIBE 10 MG PO TABS: 10.0000 mg | ORAL_TABLET | Freq: Every day | ORAL | 1 refills | Status: DC

## 2017-12-11 MED ORDER — LEVOTHYROXINE SODIUM 125 MCG PO TABS: 125.0000 ug | ORAL_TABLET | Freq: Every day | ORAL | 1 refills | Status: DC

## 2017-12-11 MED ORDER — LINACLOTIDE 72 MCG PO CAPS
72.0000 ug | ORAL_CAPSULE | Freq: Every day | ORAL | 1 refills | Status: DC
Start: 2017-12-11 — End: 2018-06-20

## 2017-12-11 MED ORDER — SIMVASTATIN 40 MG PO TABS: ORAL_TABLET | ORAL | 0 refills | Status: DC

## 2017-12-11 NOTE — Assessment & Plan Note (Signed)
Lab completed today.

## 2017-12-11 NOTE — Assessment & Plan Note (Addendum)
Lab Results  Component Value Date   HGBA1C 7.0 (A) 12/11/2017   HGBA1C 5.4 08/08/2017   HGBA1C 5.2 04/20/2016   Abnormal Microalbumin- already on ARB and BP at goal today Increased Metformin 500mg  from QD to BID Reduce sugar/CHO intake and increase regular movement F/u in 3 months

## 2017-12-11 NOTE — Patient Instructions (Addendum)
Preventive Care for Adults, Female  A healthy lifestyle and preventive care can promote health and wellness. Preventive health guidelines for women include the following key practices.   A routine yearly physical is a good way to check with your health care provider about your health and preventive screening. It is a chance to share any concerns and updates on your health and to receive a thorough exam.   Visit your dentist for a routine exam and preventive care every 6 months. Brush your teeth twice a day and floss once a day. Good oral hygiene prevents tooth decay and gum disease.   The frequency of eye exams is based on your age, health, family medical history, use of contact lenses, and other factors. Follow your health care provider's recommendations for frequency of eye exams.   Eat a healthy diet. Foods like vegetables, fruits, whole grains, low-fat dairy products, and lean protein foods contain the nutrients you need without too many calories. Decrease your intake of foods high in solid fats, added sugars, and salt. Eat the right amount of calories for you.Get information about a proper diet from your health care provider, if necessary.   Regular physical exercise is one of the most important things you can do for your health. Most adults should get at least 150 minutes of moderate-intensity exercise (any activity that increases your heart rate and causes you to sweat) each week. In addition, most adults need muscle-strengthening exercises on 2 or more days a week.   Maintain a healthy weight. The body mass index (BMI) is a screening tool to identify possible weight problems. It provides an estimate of body fat based on height and weight. Your health care provider can find your BMI, and can help you achieve or maintain a healthy weight.For adults 20 years and older:   - A BMI below 18.5 is considered underweight.   - A BMI of 18.5 to 24.9 is normal.   - A BMI of 25 to 29.9 is  considered overweight.   - A BMI of 30 and above is considered obese.   Maintain normal blood lipids and cholesterol levels by exercising and minimizing your intake of trans and saturated fats.  Eat a balanced diet with plenty of fruit and vegetables. Blood tests for lipids and cholesterol should begin at age 20 and be repeated every 5 years minimum.  If your lipid or cholesterol levels are high, you are over 40, or you are at high risk for heart disease, you may need your cholesterol levels checked more frequently.Ongoing high lipid and cholesterol levels should be treated with medicines if diet and exercise are not working.   If you smoke, find out from your health care provider how to quit. If you do not use tobacco, do not start.   Lung cancer screening is recommended for adults aged 55-80 years who are at high risk for developing lung cancer because of a history of smoking. A yearly low-dose CT scan of the lungs is recommended for people who have at least a 30-pack-year history of smoking and are a current smoker or have quit within the past 15 years. A pack year of smoking is smoking an average of 1 pack of cigarettes a day for 1 year (for example: 1 pack a day for 30 years or 2 packs a day for 15 years). Yearly screening should continue until the smoker has stopped smoking for at least 15 years. Yearly screening should be stopped for people who develop a   health problem that would prevent them from having lung cancer treatment.   If you are pregnant, do not drink alcohol. If you are breastfeeding, be very cautious about drinking alcohol. If you are not pregnant and choose to drink alcohol, do not have more than 1 drink per day. One drink is considered to be 12 ounces (355 mL) of beer, 5 ounces (148 mL) of wine, or 1.5 ounces (44 mL) of liquor.   Avoid use of street drugs. Do not share needles with anyone. Ask for help if you need support or instructions about stopping the use of  drugs.   High blood pressure causes heart disease and increases the risk of stroke. Your blood pressure should be checked at least yearly.  Ongoing high blood pressure should be treated with medicines if weight loss and exercise do not work.   If you are 69-55 years old, ask your health care provider if you should take aspirin to prevent strokes.   Diabetes screening involves taking a blood sample to check your fasting blood sugar level. This should be done once every 3 years, after age 38, if you are within normal weight and without risk factors for diabetes. Testing should be considered at a younger age or be carried out more frequently if you are overweight and have at least 1 risk factor for diabetes.   Breast cancer screening is essential preventive care for women. You should practice "breast self-awareness."  This means understanding the normal appearance and feel of your breasts and may include breast self-examination.  Any changes detected, no matter how small, should be reported to a health care provider.  Women in their 80s and 30s should have a clinical breast exam (CBE) by a health care provider as part of a regular health exam every 1 to 3 years.  After age 66, women should have a CBE every year.  Starting at age 1, women should consider having a mammogram (breast X-ray test) every year.  Women who have a family history of breast cancer should talk to their health care provider about genetic screening.  Women at a high risk of breast cancer should talk to their health care providers about having an MRI and a mammogram every year.   -Breast cancer gene (BRCA)-related cancer risk assessment is recommended for women who have family members with BRCA-related cancers. BRCA-related cancers include breast, ovarian, tubal, and peritoneal cancers. Having family members with these cancers may be associated with an increased risk for harmful changes (mutations) in the breast cancer genes BRCA1 and  BRCA2. Results of the assessment will determine the need for genetic counseling and BRCA1 and BRCA2 testing.   The Pap test is a screening test for cervical cancer. A Pap test can show cell changes on the cervix that might become cervical cancer if left untreated. A Pap test is a procedure in which cells are obtained and examined from the lower end of the uterus (cervix).   - Women should have a Pap test starting at age 57.   - Between ages 90 and 70, Pap tests should be repeated every 2 years.   - Beginning at age 63, you should have a Pap test every 3 years as long as the past 3 Pap tests have been normal.   - Some women have medical problems that increase the chance of getting cervical cancer. Talk to your health care provider about these problems. It is especially important to talk to your health care provider if a  new problem develops soon after your last Pap test. In these cases, your health care provider may recommend more frequent screening and Pap tests.   - The above recommendations are the same for women who have or have not gotten the vaccine for human papillomavirus (HPV).   - If you had a hysterectomy for a problem that was not cancer or a condition that could lead to cancer, then you no longer need Pap tests. Even if you no longer need a Pap test, a regular exam is a good idea to make sure no other problems are starting.   - If you are between ages 36 and 66 years, and you have had normal Pap tests going back 10 years, you no longer need Pap tests. Even if you no longer need a Pap test, a regular exam is a good idea to make sure no other problems are starting.   - If you have had past treatment for cervical cancer or a condition that could lead to cancer, you need Pap tests and screening for cancer for at least 20 years after your treatment.   - If Pap tests have been discontinued, risk factors (such as a new sexual partner) need to be reassessed to determine if screening should  be resumed.   - The HPV test is an additional test that may be used for cervical cancer screening. The HPV test looks for the virus that can cause the cell changes on the cervix. The cells collected during the Pap test can be tested for HPV. The HPV test could be used to screen women aged 70 years and older, and should be used in women of any age who have unclear Pap test results. After the age of 67, women should have HPV testing at the same frequency as a Pap test.   Colorectal cancer can be detected and often prevented. Most routine colorectal cancer screening begins at the age of 57 years and continues through age 26 years. However, your health care provider may recommend screening at an earlier age if you have risk factors for colon cancer. On a yearly basis, your health care provider may provide home test kits to check for hidden blood in the stool.  Use of a small camera at the end of a tube, to directly examine the colon (sigmoidoscopy or colonoscopy), can detect the earliest forms of colorectal cancer. Talk to your health care provider about this at age 23, when routine screening begins. Direct exam of the colon should be repeated every 5 -10 years through age 49 years, unless early forms of pre-cancerous polyps or small growths are found.   People who are at an increased risk for hepatitis B should be screened for this virus. You are considered at high risk for hepatitis B if:  -You were born in a country where hepatitis B occurs often. Talk with your health care provider about which countries are considered high risk.  - Your parents were born in a high-risk country and you have not received a shot to protect against hepatitis B (hepatitis B vaccine).  - You have HIV or AIDS.  - You use needles to inject street drugs.  - You live with, or have sex with, someone who has Hepatitis B.  - You get hemodialysis treatment.  - You take certain medicines for conditions like cancer, organ  transplantation, and autoimmune conditions.   Hepatitis C blood testing is recommended for all people born from [birth year] through 1965 and any individual  with known risks for hepatitis C.   Practice safe sex. Use condoms and avoid high-risk sexual practices to reduce the spread of sexually transmitted infections (STIs). STIs include gonorrhea, chlamydia, syphilis, trichomonas, herpes, HPV, and human immunodeficiency virus (HIV). Herpes, HIV, and HPV are viral illnesses that have no cure. They can result in disability, cancer, and death. Sexually active women aged 25 years and younger should be checked for chlamydia. Older women with new or multiple partners should also be tested for chlamydia. Testing for other STIs is recommended if you are sexually active and at increased risk.   Osteoporosis is a disease in which the bones lose minerals and strength with aging. This can result in serious bone fractures or breaks. The risk of osteoporosis can be identified using a bone density scan. Women ages 65 years and over and women at risk for fractures or osteoporosis should discuss screening with their health care providers. Ask your health care provider whether you should take a calcium supplement or vitamin D to There are also several preventive steps women can take to avoid osteoporosis and resulting fractures or to keep osteoporosis from worsening. -->Recommendations include:  Eat a balanced diet high in fruits, vegetables, calcium, and vitamins.  Get enough calcium. The recommended total intake of is 1,200 mg daily; for best absorption, if taking supplements, divide doses into 250-500 mg doses throughout the day. Of the two types of calcium, calcium carbonate is best absorbed when taken with food but calcium citrate can be taken on an empty stomach.  Get enough vitamin D. NAMS and the National Osteoporosis Foundation recommend at least 1,000 IU per day for women age 50 and over who are at risk of vitamin D  deficiency. Vitamin D deficiency can be caused by inadequate sun exposure (for example, those who live in northern latitudes).  Avoid alcohol and smoking. Heavy alcohol intake (more than 7 drinks per week) increases the risk of falls and hip fracture and women smokers tend to lose bone more rapidly and have lower bone mass than nonsmokers. Stopping smoking is one of the most important changes women can make to improve their health and decrease risk for disease.  Be physically active every day. Weight-bearing exercise (for example, fast walking, hiking, jogging, and weight training) may strengthen bones or slow the rate of bone loss that comes with aging. Balancing and muscle-strengthening exercises can reduce the risk of falling and fracture.  Consider therapeutic medications. Currently, several types of effective drugs are available. Healthcare providers can recommend the type most appropriate for each woman.  Eliminate environmental factors that may contribute to accidents. Falls cause nearly 90% of all osteoporotic fractures, so reducing this risk is an important bone-health strategy. Measures include ample lighting, removing obstructions to walking, using nonskid rugs on floors, and placing mats and/or grab bars in showers.  Be aware of medication side effects. Some common medicines make bones weaker. These include a type of steroid drug called glucocorticoids used for arthritis and asthma, some antiseizure drugs, certain sleeping pills, treatments for endometriosis, and some cancer drugs. An overactive thyroid gland or using too much thyroid hormone for an underactive thyroid can also be a problem. If you are taking these medicines, talk to your doctor about what you can do to help protect your bones.reduce the rate of osteoporosis.    Menopause can be associated with physical symptoms and risks. Hormone replacement therapy is available to decrease symptoms and risks. You should talk to your  health care provider   about whether hormone replacement therapy is right for you.   Use sunscreen. Apply sunscreen liberally and repeatedly throughout the day. You should seek shade when your shadow is shorter than you. Protect yourself by wearing long sleeves, pants, a wide-brimmed hat, and sunglasses year round, whenever you are outdoors.   Once a month, do a whole body skin exam, using a mirror to look at the skin on your back. Tell your health care provider of new moles, moles that have irregular borders, moles that are larger than a pencil eraser, or moles that have changed in shape or color.   -Stay current with required vaccines (immunizations).   Influenza vaccine. All adults should be immunized every year.  Tetanus, diphtheria, and acellular pertussis (Td, Tdap) vaccine. Pregnant women should receive 1 dose of Tdap vaccine during each pregnancy. The dose should be obtained regardless of the length of time since the last dose. Immunization is preferred during the 27th 36th week of gestation. An adult who has not previously received Tdap or who does not know her vaccine status should receive 1 dose of Tdap. This initial dose should be followed by tetanus and diphtheria toxoids (Td) booster doses every 10 years. Adults with an unknown or incomplete history of completing a 3-dose immunization series with Td-containing vaccines should begin or complete a primary immunization series including a Tdap dose. Adults should receive a Td booster every 10 years.  Varicella vaccine. An adult without evidence of immunity to varicella should receive 2 doses or a second dose if she has previously received 1 dose. Pregnant females who do not have evidence of immunity should receive the first dose after pregnancy. This first dose should be obtained before leaving the health care facility. The second dose should be obtained 4 8 weeks after the first dose.  Human papillomavirus (HPV) vaccine. Females aged 13 26  years who have not received the vaccine previously should obtain the 3-dose series. The vaccine is not recommended for use in pregnant females. However, pregnancy testing is not needed before receiving a dose. If a female is found to be pregnant after receiving a dose, no treatment is needed. In that case, the remaining doses should be delayed until after the pregnancy. Immunization is recommended for any person with an immunocompromised condition through the age of 26 years if she did not get any or all doses earlier. During the 3-dose series, the second dose should be obtained 4 8 weeks after the first dose. The third dose should be obtained 24 weeks after the first dose and 16 weeks after the second dose.  Zoster vaccine. One dose is recommended for adults aged 60 years or older unless certain conditions are present.  Measles, mumps, and rubella (MMR) vaccine. Adults born before 1957 generally are considered immune to measles and mumps. Adults born in 1957 or later should have 1 or more doses of MMR vaccine unless there is a contraindication to the vaccine or there is laboratory evidence of immunity to each of the three diseases. A routine second dose of MMR vaccine should be obtained at least 28 days after the first dose for students attending postsecondary schools, health care workers, or international travelers. People who received inactivated measles vaccine or an unknown type of measles vaccine during 1963 1967 should receive 2 doses of MMR vaccine. People who received inactivated mumps vaccine or an unknown type of mumps vaccine before 1979 and are at high risk for mumps infection should consider immunization with 2 doses of   MMR vaccine. For females of childbearing age, rubella immunity should be determined. If there is no evidence of immunity, females who are not pregnant should be vaccinated. If there is no evidence of immunity, females who are pregnant should delay immunization until after pregnancy.  Unvaccinated health care workers born before [birth year] who lack laboratory evidence of measles, mumps, or rubella immunity or laboratory confirmation of disease should consider measles and mumps immunization with 2 doses of MMR vaccine or rubella immunization with 1 dose of MMR vaccine.  Pneumococcal 13-valent conjugate (PCV13) vaccine. When indicated, a person who is uncertain of her immunization history and has no record of immunization should receive the PCV13 vaccine. An adult aged 54 years or older who has certain medical conditions and has not been previously immunized should receive 1 dose of PCV13 vaccine. This PCV13 should be followed with a dose of pneumococcal polysaccharide (PPSV23) vaccine. The PPSV23 vaccine dose should be obtained at least 8 weeks after the dose of PCV13 vaccine. An adult aged 58 years or older who has certain medical conditions and previously received 1 or more doses of PPSV23 vaccine should receive 1 dose of PCV13. The PCV13 vaccine dose should be obtained 1 or more years after the last PPSV23 vaccine dose.  Pneumococcal polysaccharide (PPSV23) vaccine. When PCV13 is also indicated, PCV13 should be obtained first. All adults aged 58 years and older should be immunized. An adult younger than age 65 years who has certain medical conditions should be immunized. Any person who resides in a nursing home or long-term care facility should be immunized. An adult smoker should be immunized. People with an immunocompromised condition and certain other conditions should receive both PCV13 and PPSV23 vaccines. People with human immunodeficiency virus (HIV) infection should be immunized as soon as possible after diagnosis. Immunization during chemotherapy or radiation therapy should be avoided. Routine use of PPSV23 vaccine is not recommended for American Indians, [place] Natives, or people younger than 65 years unless there are medical conditions that require PPSV23 vaccine. When indicated,  people who have unknown immunization and have no record of immunization should receive PPSV23 vaccine. One-time revaccination 5 years after the first dose of PPSV23 is recommended for people aged 70 64 years who have chronic kidney failure, nephrotic syndrome, asplenia, or immunocompromised conditions. People who received 1 2 doses of PPSV23 before age 32 years should receive another dose of PPSV23 vaccine at age 96 years or later if at least 5 years have passed since the previous dose. Doses of PPSV23 are not needed for people immunized with PPSV23 at or after age 55 years.  Meningococcal vaccine. Adults with asplenia or persistent complement component deficiencies should receive 2 doses of quadrivalent meningococcal conjugate (MenACWY-D) vaccine. The doses should be obtained at least 2 months apart. Microbiologists working with certain meningococcal bacteria, [employer] recruits, people at risk during an outbreak, and people who travel to or live in countries with a high rate of meningitis should be immunized. A first-year college student up through age 58 years who is living in a residence hall should receive a dose if she did not receive a dose on or after her 16th birthday. Adults who have certain high-risk conditions should receive one or more doses of vaccine.  Hepatitis A vaccine. Adults who wish to be protected from this disease, have certain high-risk conditions, work with hepatitis A-infected animals, work in hepatitis A research labs, or travel to or work in countries with a high rate of hepatitis A should be  immunized. Adults who were previously unvaccinated and who anticipate close contact with an international adoptee during the first 60 days after arrival in the [place] from a country with a high rate of hepatitis A should be immunized.  Hepatitis B vaccine.  Adults who wish to be protected from this disease, have certain high-risk conditions, may be exposed to blood or other infectious  body fluids, are household contacts or sex partners of hepatitis B positive people, are clients or workers in certain care facilities, or travel to or work in countries with a high rate of hepatitis B should be immunized.  Haemophilus influenzae type b (Hib) vaccine. A previously unvaccinated person with asplenia or sickle cell disease or having a scheduled splenectomy should receive 1 dose of Hib vaccine. Regardless of previous immunization, a recipient of a hematopoietic stem cell transplant should receive a 3-dose series 6 12 months after her successful transplant. Hib vaccine is not recommended for adults with HIV infection.  Preventive Services / Frequency Ages 6 to 39years  Blood pressure check.** / Every 1 to 2 years.  Lipid and cholesterol check.** / Every 5 years beginning at age 39.  Clinical breast exam.** / Every 3 years for women in their 61s and 62s.  BRCA-related cancer risk assessment.** / For women who have family members with a BRCA-related cancer (breast, ovarian, tubal, or peritoneal cancers).  Pap test.** / Every 2 years from ages 47 through 85. Every 3 years starting at age 34 through age 12 or 74 with a history of 3 consecutive normal Pap tests.  HPV screening.** / Every 3 years from ages 46 through ages 43 to 54 with a history of 3 consecutive normal Pap tests.  Hepatitis C blood test.** / For any individual with known risks for hepatitis C.  Skin self-exam. / Monthly.  Influenza vaccine. / Every year.  Tetanus, diphtheria, and acellular pertussis (Tdap, Td) vaccine.** / Consult your health care provider. Pregnant women should receive 1 dose of Tdap vaccine during each pregnancy. 1 dose of Td every 10 years.  Varicella vaccine.** / Consult your health care provider. Pregnant females who do not have evidence of immunity should receive the first dose after pregnancy.  HPV vaccine. / 3 doses over 6 months, if 64 and younger. The vaccine is not recommended for use in  pregnant females. However, pregnancy testing is not needed before receiving a dose.  Measles, mumps, rubella (MMR) vaccine.** / You need at least 1 dose of MMR if you were born in 1957 or later. You may also need a 2nd dose. For females of childbearing age, rubella immunity should be determined. If there is no evidence of immunity, females who are not pregnant should be vaccinated. If there is no evidence of immunity, females who are pregnant should delay immunization until after pregnancy.  Pneumococcal 13-valent conjugate (PCV13) vaccine.** / Consult your health care provider.  Pneumococcal polysaccharide (PPSV23) vaccine.** / 1 to 2 doses if you smoke cigarettes or if you have certain conditions.  Meningococcal vaccine.** / 1 dose if you are age 71 to 37 years and a Market researcher living in a residence hall, or have one of several medical conditions, you need to get vaccinated against meningococcal disease. You may also need additional booster doses.  Hepatitis A vaccine.** / Consult your health care provider.  Hepatitis B vaccine.** / Consult your health care provider.  Haemophilus influenzae type b (Hib) vaccine.** / Consult your health care provider.  Ages 55 to 64years  Blood pressure check.** / Every 1 to 2 years.  Lipid and cholesterol check.** / Every 5 years beginning at age 20 years.  Lung cancer screening. / Every year if you are aged 55 80 years and have a 30-pack-year history of smoking and currently smoke or have quit within the past 15 years. Yearly screening is stopped once you have quit smoking for at least 15 years or develop a health problem that would prevent you from having lung cancer treatment.  Clinical breast exam.** / Every year after age 40 years.  BRCA-related cancer risk assessment.** / For women who have family members with a BRCA-related cancer (breast, ovarian, tubal, or peritoneal cancers).  Mammogram.** / Every year beginning at age 40  years and continuing for as long as you are in good health. Consult with your health care provider.  Pap test.** / Every 3 years starting at age 30 years through age 65 or 70 years with a history of 3 consecutive normal Pap tests.  HPV screening.** / Every 3 years from ages 30 years through ages 65 to 70 years with a history of 3 consecutive normal Pap tests.  Fecal occult blood test (FOBT) of stool. / Every year beginning at age 50 years and continuing until age 75 years. You may not need to do this test if you get a colonoscopy every 10 years.  Flexible sigmoidoscopy or colonoscopy.** / Every 5 years for a flexible sigmoidoscopy or every 10 years for a colonoscopy beginning at age 50 years and continuing until age 75 years.  Hepatitis C blood test.** / For all people born from 1945 through 1965 and any individual with known risks for hepatitis C.  Skin self-exam. / Monthly.  Influenza vaccine. / Every year.  Tetanus, diphtheria, and acellular pertussis (Tdap/Td) vaccine.** / Consult your health care provider. Pregnant women should receive 1 dose of Tdap vaccine during each pregnancy. 1 dose of Td every 10 years.  Varicella vaccine.** / Consult your health care provider. Pregnant females who do not have evidence of immunity should receive the first dose after pregnancy.  Zoster vaccine.** / 1 dose for adults aged 60 years or older.  Measles, mumps, rubella (MMR) vaccine.** / You need at least 1 dose of MMR if you were born in 1957 or later. You may also need a 2nd dose. For females of childbearing age, rubella immunity should be determined. If there is no evidence of immunity, females who are not pregnant should be vaccinated. If there is no evidence of immunity, females who are pregnant should delay immunization until after pregnancy.  Pneumococcal 13-valent conjugate (PCV13) vaccine.** / Consult your health care provider.  Pneumococcal polysaccharide (PPSV23) vaccine.** / 1 to 2 doses if  you smoke cigarettes or if you have certain conditions.  Meningococcal vaccine.** / Consult your health care provider.  Hepatitis A vaccine.** / Consult your health care provider.  Hepatitis B vaccine.** / Consult your health care provider.  Haemophilus influenzae type b (Hib) vaccine.** / Consult your health care provider.  Ages 65 years and over  Blood pressure check.** / Every 1 to 2 years.  Lipid and cholesterol check.** / Every 5 years beginning at age 20 years.  Lung cancer screening. / Every year if you are aged 55 80 years and have a 30-pack-year history of smoking and currently smoke or have quit within the past 15 years. Yearly screening is stopped once you have quit smoking for at least 15 years or develop a health problem that   would prevent you from having lung cancer treatment.  Clinical breast exam.** / Every year after age 103 years.  BRCA-related cancer risk assessment.** / For women who have family members with a BRCA-related cancer (breast, ovarian, tubal, or peritoneal cancers).  Mammogram.** / Every year beginning at age 36 years and continuing for as long as you are in good health. Consult with your health care provider.  Pap test.** / Every 3 years starting at age 5 years through age 85 or 10 years with 3 consecutive normal Pap tests. Testing can be stopped between 65 and 70 years with 3 consecutive normal Pap tests and no abnormal Pap or HPV tests in the past 10 years.  HPV screening.** / Every 3 years from ages 93 years through ages 70 or 45 years with a history of 3 consecutive normal Pap tests. Testing can be stopped between 65 and 70 years with 3 consecutive normal Pap tests and no abnormal Pap or HPV tests in the past 10 years.  Fecal occult blood test (FOBT) of stool. / Every year beginning at age 8 years and continuing until age 45 years. You may not need to do this test if you get a colonoscopy every 10 years.  Flexible sigmoidoscopy or colonoscopy.** /  Every 5 years for a flexible sigmoidoscopy or every 10 years for a colonoscopy beginning at age 69 years and continuing until age 68 years.  Hepatitis C blood test.** / For all people born from [birth year] through 1965 and any individual with known risks for hepatitis C.  Osteoporosis screening.** / A one-time screening for women ages 7 years and over and women at risk for fractures or osteoporosis.  Skin self-exam. / Monthly.  Influenza vaccine. / Every year.  Tetanus, diphtheria, and acellular pertussis (Tdap/Td) vaccine.** / 1 dose of Td every 10 years.  Varicella vaccine.** / Consult your health care provider.  Zoster vaccine.** / 1 dose for adults aged 5 years or older.  Pneumococcal 13-valent conjugate (PCV13) vaccine.** / Consult your health care provider.  Pneumococcal polysaccharide (PPSV23) vaccine.** / 1 dose for all adults aged 74 years and older.  Meningococcal vaccine.** / Consult your health care provider.  Hepatitis A vaccine.** / Consult your health care provider.  Hepatitis B vaccine.** / Consult your health care provider.  Haemophilus influenzae type b (Hib) vaccine.** / Consult your health care provider. ** Family history and personal history of risk and conditions may change your health care provider's recommendations. Document Released: 08/07/2001 Document Revised: 04/01/2013  [facility] Patient Information 2014 [place], [place].   EXERCISE AND DIET:  We recommended that you start or continue a regular exercise program for good health. Regular exercise means any activity that makes your heart beat faster and makes you sweat.  We recommend exercising at least 30 minutes per day at least 3 days a week, preferably 5.  We also recommend a diet low in fat and sugar / carbohydrates.  Inactivity, poor dietary choices and obesity can cause diabetes, heart attack, stroke, and kidney damage, among others.     ALCOHOL AND SMOKING:  Women should limit their alcohol intake to no  more than 7 drinks/beers/glasses of wine (combined, not each!) per week. Moderation of alcohol intake to this level decreases your risk of breast cancer and liver damage.  ( And of course, no recreational drugs are part of a healthy lifestyle.)  Also, you should not be smoking at all or even being exposed to second hand smoke. Most people know smoking can  cause cancer, and various heart and lung diseases, but did you know it also contributes to weakening of your bones?  Aging of your skin?  Yellowing of your teeth and nails?   CALCIUM AND VITAMIN D:  Adequate intake of calcium and Vitamin D are recommended.  The recommendations for exact amounts of these supplements seem to change often, but generally speaking 600 mg of calcium (either carbonate or citrate) and 800 units of Vitamin D per day seems prudent. Certain women may benefit from higher intake of Vitamin D.  If you are among these women, your doctor will have told you during your visit.     PAP SMEARS:  Pap smears, to check for cervical cancer or precancers,  have traditionally been done yearly, although recent scientific advances have shown that most women can have pap smears less often.  However, every woman still should have a physical exam from her gynecologist or primary care physician every year. It will include a breast check, inspection of the vulva and vagina to check for abnormal growths or skin changes, a visual exam of the cervix, and then an exam to evaluate the size and shape of the uterus and ovaries.  And after 57 years of age, a rectal exam is indicated to check for rectal cancers. We will also provide age appropriate advice regarding health maintenance, like when you should have certain vaccines, screening for sexually transmitted diseases, bone density testing, colonoscopy, mammograms, etc.    MAMMOGRAMS:  All women over 62 years old should have a yearly mammogram. Many facilities now offer a "3D" mammogram, which may cost  around $50 extra out of pocket. If possible,  we recommend you accept the option to have the 3D mammogram performed.  It both reduces the number of women who will be called back for extra views which then turn out to be normal, and it is better than the routine mammogram at detecting truly abnormal areas.     COLONOSCOPY:  Colonoscopy to screen for colon cancer is recommended for all women at age 65.  We know, you hate the idea of the prep.  We agree, BUT, having colon cancer and not knowing it is worse!!  Colon cancer so often starts as a polyp that can be seen and removed at colonscopy, which can quite literally save your life!  And if your first colonoscopy is normal and you have no family history of colon cancer, most women don't have to have it again for 10 years.  Once every ten years, you can do something that may end up saving your life, right?  We will be happy to help you get it scheduled when you are ready.  Be sure to check your insurance coverage so you understand how much it will cost.  It may be covered as a preventative service at no cost, but you should check your particular policy.   Please continue all medications as directed. Increase in A1c, therefore increase Metformin 561m from once daily to twice daily with food. Start once daily Linaclotide 72 mcg to help treat constipation. Please call [facility] to schedule your colonoscopy.  Referral to Urology placed to address urinary symptoms. Continue with mental health as directed. Follow-up here in 3 months. NICE TO SEE YOU!

## 2017-12-11 NOTE — Assessment & Plan Note (Signed)
BP at goal 112/71, HR 82 She has been on Prinzide 20/12.5mg  BID for years

## 2017-12-11 NOTE — Assessment & Plan Note (Signed)
Referral to Urology placed

## 2017-12-11 NOTE — Assessment & Plan Note (Signed)
Increase water and diet rich in fruits/vegetables Started on once daily linaclotide 72 mcg  F/u with established GI

## 2017-12-12 LAB — HEPATITIS C ANTIBODY: Hep C Virus Ab: <0.1 s/co ratio (ref 0.0–0.9)

## 2018-01-08 ENCOUNTER — Other Ambulatory Visit: Payer: Self-pay | Admitting: Adult Health

## 2018-01-08 NOTE — Telephone Encounter (Signed)
I will RF I will discuss long term pain control goals at her f/u in Sept Thanks! Valetta Fuller

## 2018-01-08 NOTE — Telephone Encounter (Signed)
Please review and refill if appropriate.  I am unsure if pt is continue this medication indefinitely or not.  Charyl Bigger, CMA

## 2018-01-22 ENCOUNTER — Other Ambulatory Visit: Payer: Self-pay | Admitting: Adult Health

## 2018-01-22 NOTE — Telephone Encounter (Signed)
Pt called stating that she needs refill of metformin.  Reviewed chart and her increased dose of 6/19 was not sent to pharmacy, marked "no print".  Sent refill to pharmacy.  Charyl Bigger, CMA

## 2018-02-03 ENCOUNTER — Telehealth: Payer: Self-pay | Admitting: Adult Health

## 2018-02-03 NOTE — Telephone Encounter (Signed)
Patient called states has all the symptoms of a sinus infection --- Pt request provider call in Rx or advise if OV necessary.  --Forwarding message to medical assistant to review w/provider & call pt with decision.  ---Pt has made OV appt for 8/13 @ 2pm if necessary if not will cancel.   --glh

## 2018-02-03 NOTE — Telephone Encounter (Signed)
Advised pt that she needs to have OV.  Pt expressed understanding and is agreeable.  Charyl Bigger, CMA

## 2018-02-04 ENCOUNTER — Ambulatory Visit: Payer: Medicare Other | Admitting: Adult Health

## 2018-02-04 NOTE — Progress Notes (Deleted)
Subjective:    Patient ID: Alejandra Taylor, female    DOB: 04-15-1961, 57 y.o.   MRN: [id number]  HPI:  Alejandra Taylor presents with  Patient Care Team    Relationship Specialty Notifications Start End  Esaw Grandchild, NP PCP - General Family Medicine  04/17/17   Melrose Nakayama, MD Consulting Physician Orthopedic Surgery  04/18/17   Juluis Rainier  Optometry  08/08/17     Patient Active Problem List   Diagnosis Date Noted  . Urinary incontinence 12/11/2017  . Constipation 12/11/2017  . Need for hepatitis C screening test 12/11/2017  . Diabetes mellitus without complication ([place]) 38/03/1750  . Healthcare maintenance 06/10/2017  . Colitis 05/24/2017  . Tobacco abuse 05/24/2017  . UTI (urinary tract infection) 05/24/2017  . Depression with anxiety 05/24/2017  . AKI (acute kidney injury) ([place]) 05/24/2017  . Hyponatremia 05/24/2017  . Migraine with status migrainosus, not intractable 04/17/2017  . Other chronic pain 04/17/2017  . Essential hypertension 04/17/2017  . PTSD (post-traumatic stress disorder) 04/17/2017  . Hypothyroidism 04/17/2017  . Hyperlipidemia 04/17/2017  . Endometrial cancer ([place]) 01/22/2012     Past Medical History:  Diagnosis Date  . Chronic back pain   . Colitis   . Diabetes mellitus   . 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 History of degenerative disc disease   . Hypercholesterolemia   . Hypertension   . Hyperthyroidism   . Migraines   . Post traumatic stress disorder   . Spinal stenosis      Past Surgical History:  Procedure Laterality Date  . ABDOMINAL HYSTERECTOMY    . CHOLECYSTECTOMY    . GANGLION CYST EXCISION     L wrist cyst removal with tendon rupture and repair  . ROBOTIC ASSISTED LAP VAGINAL HYSTERECTOMY  08/19/2009   BSO for endo ca  . TONSILLECTOMY       Family History  Problem Relation Age of Onset  . Hypertension Father   . Diabetes Father   . Heart attack Father   . Pancreatic cancer Father   . Colon cancer  Maternal Aunt   . Ovarian cancer Maternal Aunt   . Ovarian cancer Paternal Grandmother   . Stroke Paternal Grandmother   . Diabetes Paternal Grandmother   . Ovarian cancer Cousin   . Breast cancer Cousin      Social History   Substance and Sexual Activity  Drug Use No     Social History   Substance and Sexual Activity  Alcohol Use No     Social History   Tobacco Use  Smoking Status Current Every Day Smoker  . Packs/day: 1.00  . Years: 30.00  . Pack years: 30.00  . Types: Cigarettes  Smokeless Tobacco Never Used     Outpatient Encounter Medications as of 02/04/2018  Medication Sig Note  . albuterol (PROVENTIL HFA;VENTOLIN HFA) 108 (90 BASE) MCG/ACT inhaler Inhale 2 puffs into the lungs every 6 (six) hours as needed for wheezing or shortness of breath.    . clonazePAM (KLONOPIN) 1 MG tablet Take 1 mg by mouth 3 (three) times daily. 04/07/2015: Received from: External Pharmacy  . estazolam (PROSOM) 2 MG tablet TAKE 1 TABLET AT BEDTIME AS NEEDED FOR SLEEP MAY REPEAT ONCE AS NEEDED ONLY IF MID-AWAKENING 04/07/2015: Received from: External Pharmacy  . ezetimibe (ZETIA) 10 MG tablet Take 1 tablet (10 mg total) by mouth daily.   Marland Kitchen glucose blood test strip 1 each by Other route as needed. Use as instructed   .  HYDROcodone-acetaminophen (NORCO) 10-325 MG tablet TAKE 1 OR 2 TABLETS BY MOUTH EVERY 6 HOURS AS NEEDED FOR PAIN MUST LAST 30 DAYS 04/07/2015: Received from: External Pharmacy  . levothyroxine (SYNTHROID, LEVOTHROID) 125 MCG tablet Take 1 tablet (125 mcg total) by mouth daily.   Marland Kitchen linaclotide (LINZESS) 72 MCG capsule Take 1 capsule (72 mcg total) by mouth daily before breakfast.   . lisinopril-hydrochlorothiazide (PRINZIDE,ZESTORETIC) 20-12.5 MG tablet Take 1 tablet by mouth 2 (two) times daily.   Marland Kitchen loperamide (IMODIUM) 2 MG capsule Take 2 mg by mouth as needed for diarrhea or loose stools.   . meloxicam (MOBIC) 15 MG tablet TAKE 1 TABLET BY MOUTH DAILY WITH FOOD AS NEEDED  FOR PAIN   . metFORMIN (GLUCOPHAGE) 500 MG tablet Take 1 tablet (500 mg total) by mouth 2 (two) times daily with a meal.   . methocarbamol (ROBAXIN) 500 MG tablet Take 500 mg by mouth 2 (two) times daily.   Marland Kitchen NARCAN 4 MG/0.1ML LIQD nasal spray kit 1 AS NEEDED FOR OVERDOSE   . pantoprazole (PROTONIX) 40 MG tablet Take 1 tablet (40 mg total) by mouth daily.   . QUEtiapine (SEROQUEL XR) 300 MG 24 hr tablet Take 300 mg by mouth at bedtime.   Marland Kitchen QUEtiapine (SEROQUEL) 300 MG tablet TAKE 1 TABLET BY MOUTH AT NIGHT 1 HOUR BEFORE BED   . simvastatin (ZOCOR) 40 MG tablet TAKE 1 TABLET BY MOUTH EVERY DAY IN THE EVENING   . tiZANidine (ZANAFLEX) 4 MG capsule TAKE 1 CAP AS NEEDED ONCE A DAY FOR MUSCLE SPASM   . topiramate (TOPAMAX) 100 MG tablet Take 2 tablets (200 mg total) by mouth at bedtime.   Marland Kitchen venlafaxine XR (EFFEXOR-XR) 150 MG 24 hr capsule Take 150 mg by mouth. Take 2 capsules every morning and 1 capsule at night    No facility-administered encounter medications on file as of 02/04/2018.     Allergies: Other and Penicillins  There is no height or weight on file to calculate BMI.  There were no vitals taken for this visit.     Review of Systems     Objective:   Physical Exam        Assessment & Plan:  No diagnosis found.  No problem-specific Assessment & Plan notes found for this encounter.    FOLLOW-UP:  No follow-ups on file.

## 2018-02-09 ENCOUNTER — Other Ambulatory Visit: Payer: Self-pay | Admitting: Adult Health

## 2018-02-10 ENCOUNTER — Encounter: Payer: Self-pay | Admitting: Adult Health

## 2018-03-06 ENCOUNTER — Other Ambulatory Visit: Payer: Self-pay

## 2018-03-06 MED ORDER — LISINOPRIL-HYDROCHLOROTHIAZIDE 20-12.5 MG PO TABS: 1.0000 | ORAL_TABLET | Freq: Two times a day (BID) | ORAL | 0 refills | Status: DC

## 2018-03-09 IMAGING — CT CT ABD-PELV W/O CM
2 of 4 series · 16 of 46 positions shown, 18 images · non-contrast
Comparison: None.

CLINICAL DATA: 56 y/o F; diarrhea and lower abdominal pain for 3
days. History of endometrial carcinoma, diabetes, and hypertension.

EXAM:
CT ABDOMEN AND PELVIS WITHOUT CONTRAST
TECHNIQUE: Multidetector CT imaging of the abdomen and pelvis was performed
following the standard protocol without IV contrast.

[Series 2: abd/pel w/o · axial · non-contrast · 0.86mm/px · z∈[+986,+1476]mm · 13 of 110 slices shown, 15 images]
[im 6/110  soft-tissue]
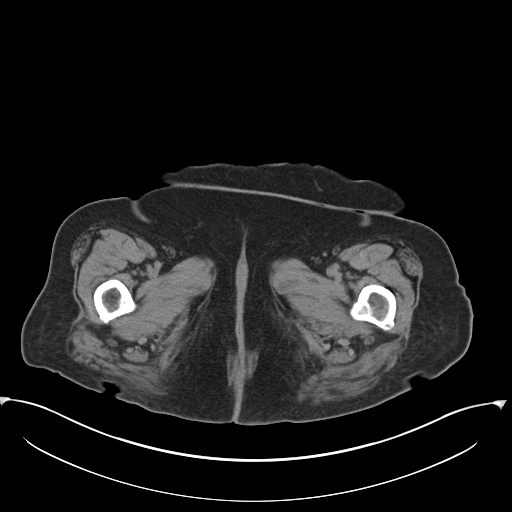
[im 6/110  bone]
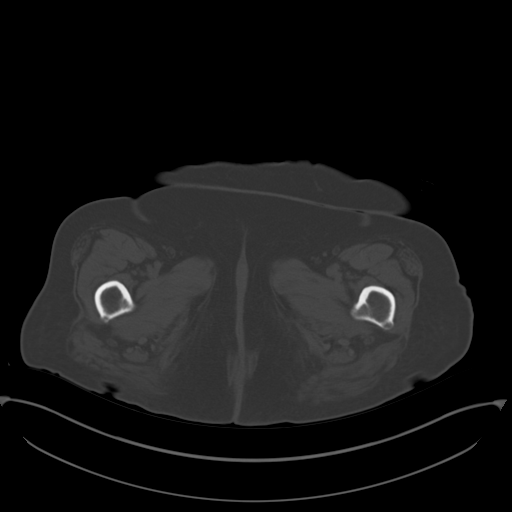
[im 17/110  soft-tissue]
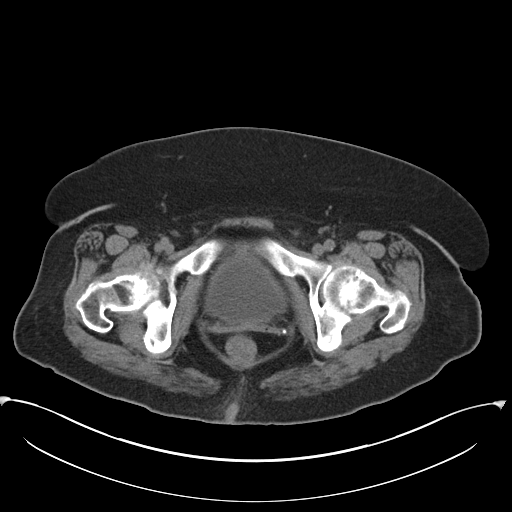
[im 22/110  soft-tissue]
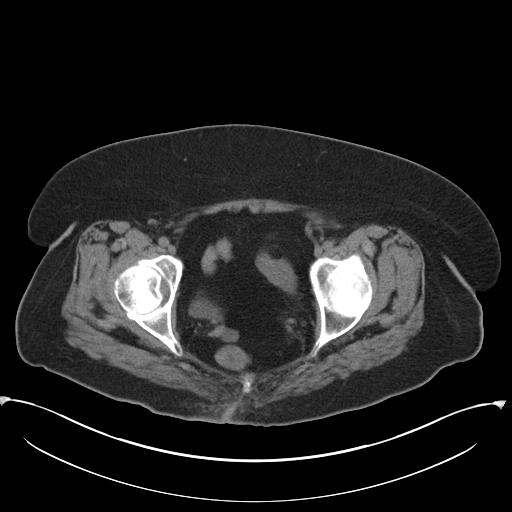
[im 33/110  soft-tissue]
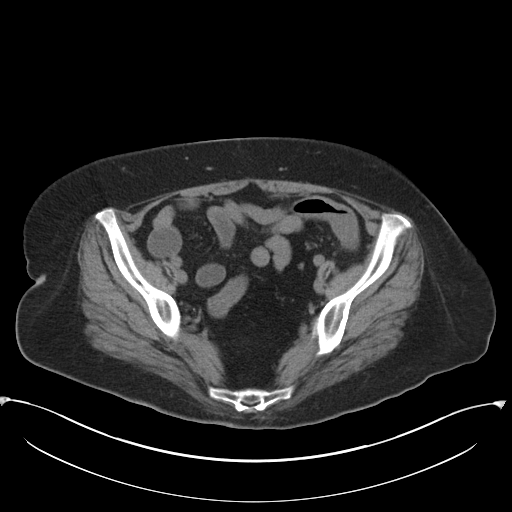
[im 39/110  soft-tissue]
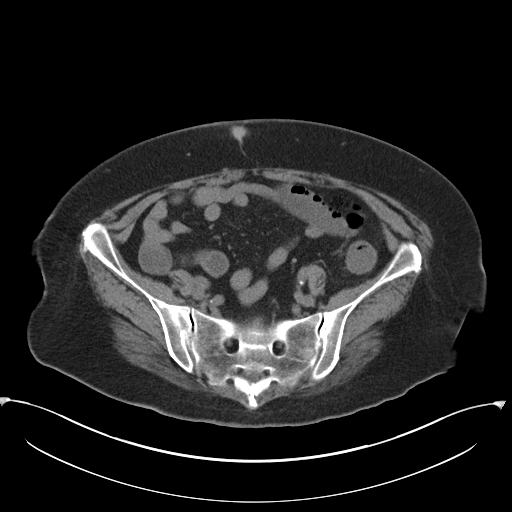
[im 50/110  soft-tissue]
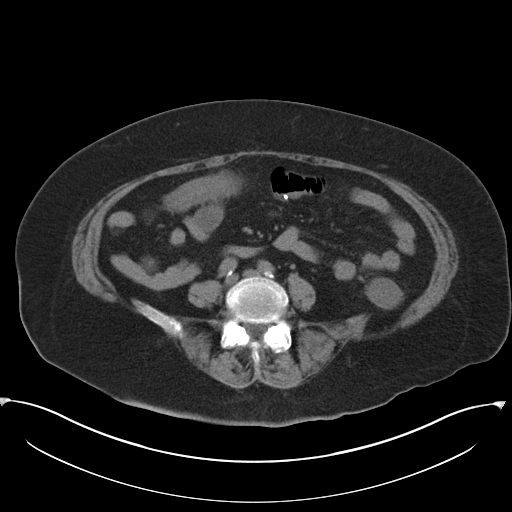
[im 55/110  soft-tissue]
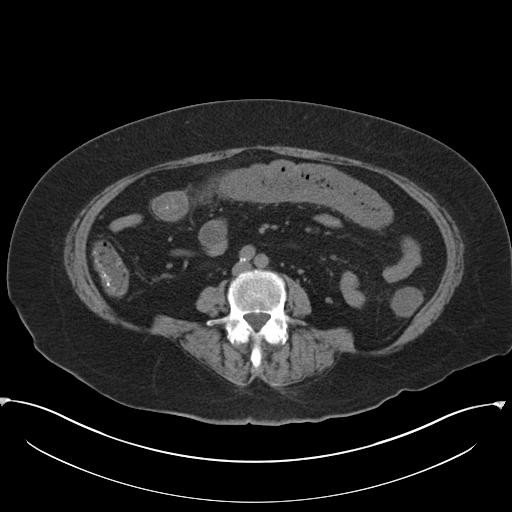
[im 60/110  soft-tissue]
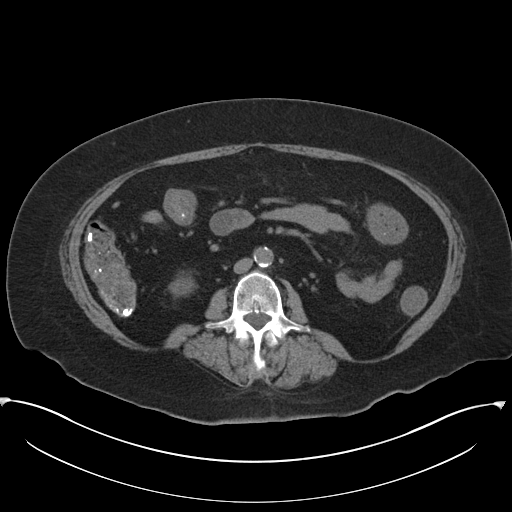
[im 71/110  soft-tissue]
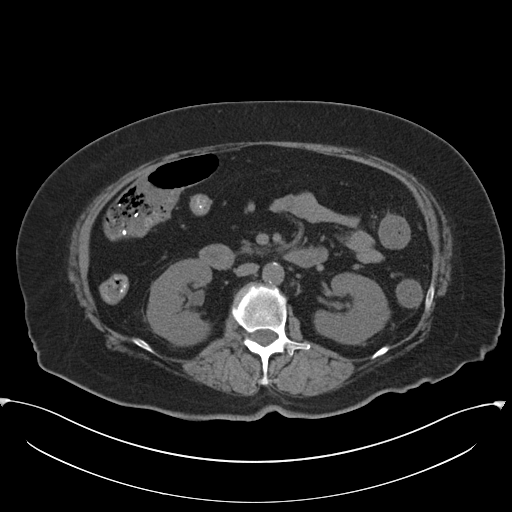
[im 71/110  bone]
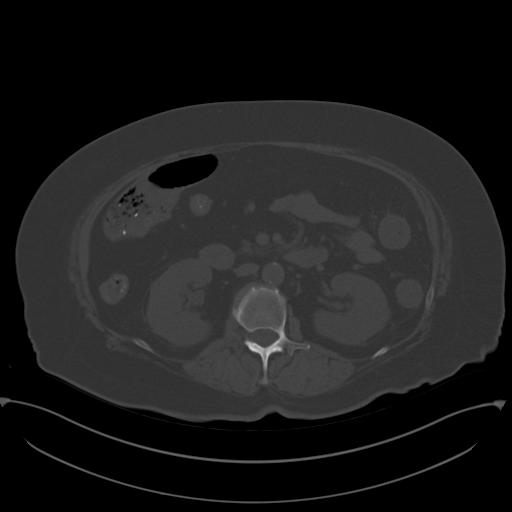
[im 77/110  soft-tissue]
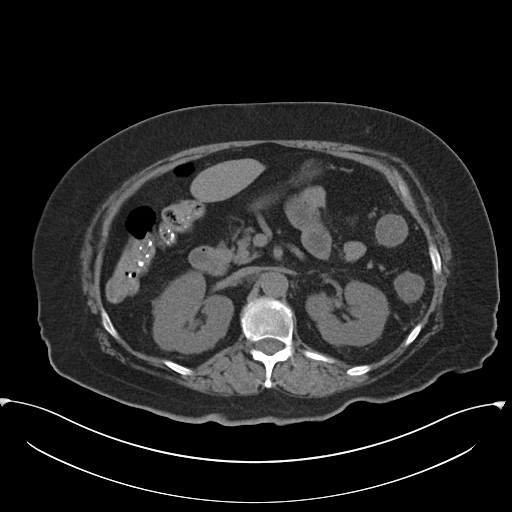
[im 88/110  soft-tissue]
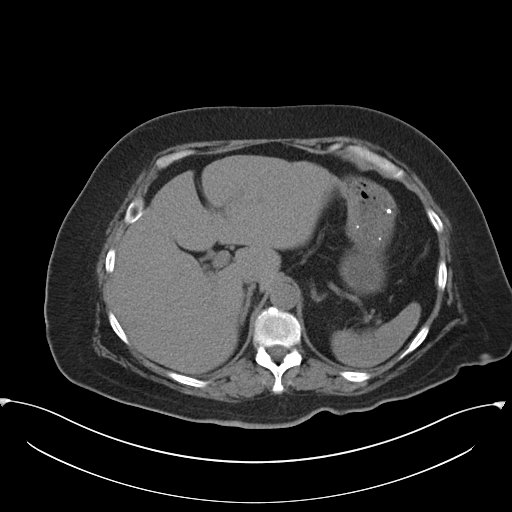
[im 93/110  soft-tissue]
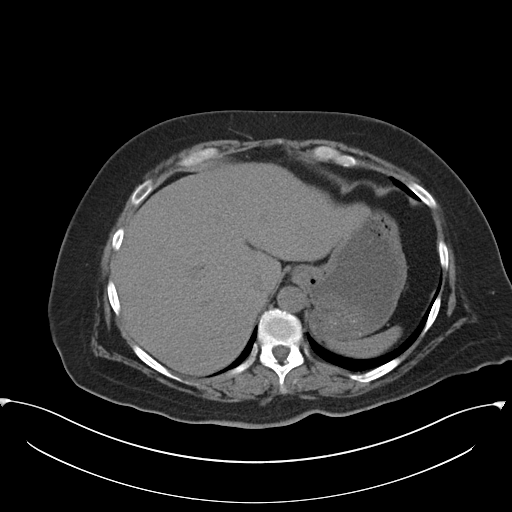
[im 104/110  soft-tissue]
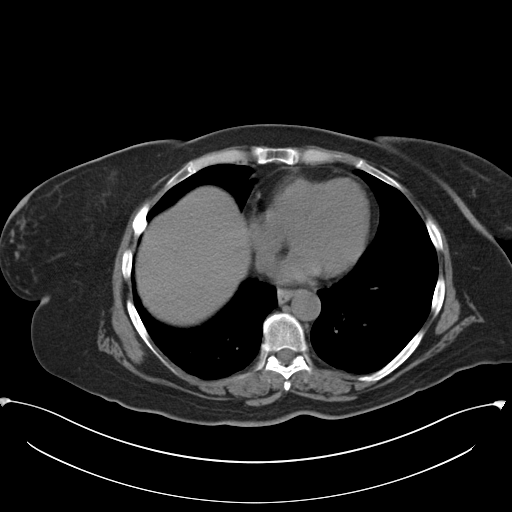

[Series 4: coronal · coronal · 0.90mm/px · 3 of 151 slices shown]
[im 51/151  soft-tissue]
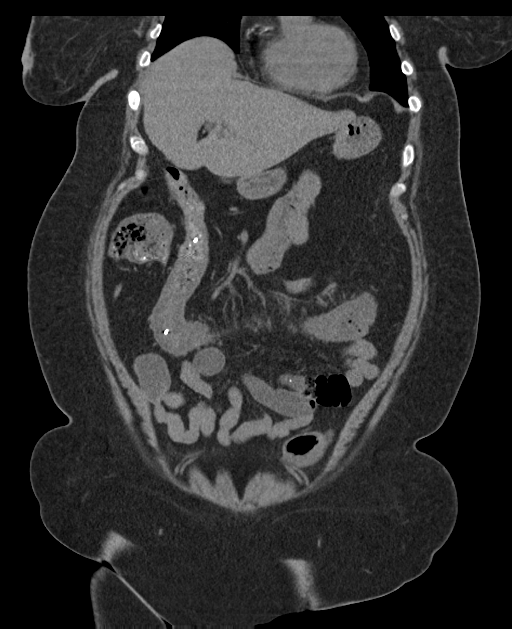
[im 67/151  soft-tissue]
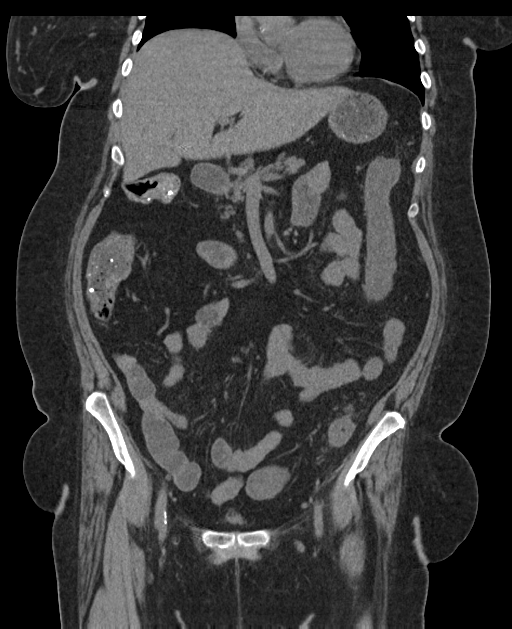
[im 84/151  soft-tissue]
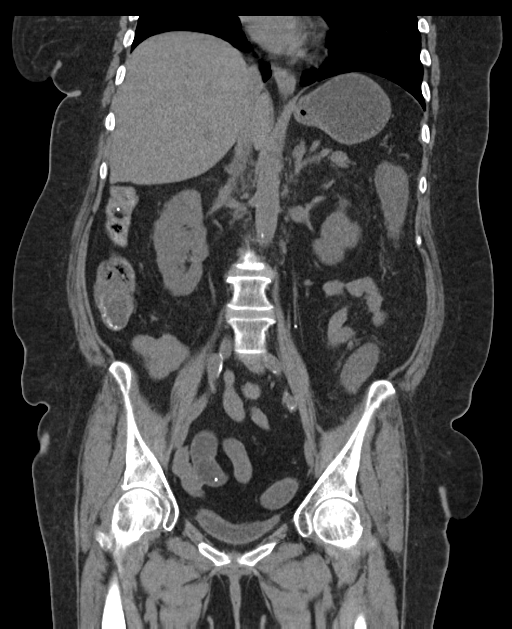

[16 of 46 positions shown; findings below may reference images not displayed]

FINDINGS: Lower chest: Mild coronary artery calcific atherosclerosis.
Subcentimeter calcified granuloma in the right lung base.

Hepatobiliary: No focal liver abnormality is seen. Status post
cholecystectomy. No biliary dilatation.

Pancreas: Unremarkable. No pancreatic ductal dilatation or
surrounding inflammatory changes.

Spleen: Normal in size without focal abnormality.

Adrenals/Urinary Tract: Adrenal glands are unremarkable. Kidneys are
normal, without renal calculi, focal lesion, or hydronephrosis.
Bladder is unremarkable.

Stomach/Bowel: Normal stomach and small bowel. Mild diffuse wall
thickening of transverse colon and descending colon with loss of
normal features compatible with acute colitis. No evidence for
perforation or abscess. Normal appendix.

Vascular/Lymphatic: Moderate aortic atherosclerosis. No enlarged
abdominal or pelvic lymph nodes.

Reproductive: Status post hysterectomy. No adnexal masses.

Other: No abdominal wall hernia or abnormality. No abdominopelvic
ascites.

Musculoskeletal: Chronic right anterolateral rib fractures. Grade 1
L4-5 anterolisthesis. No acute fracture.
IMPRESSION: 1. Transverse and descending infectious or inflammatory colitis. No
evidence for perforation or abscess.
2. Coronary and aortic calcific atherosclerosis.

By: Bety Ambrosio M.D.

## 2018-03-12 NOTE — Progress Notes (Signed)
Subjective:    Patient ID: Alejandra Taylor, female    DOB: 1961-03-09, 57 y.o.   MRN: [id number]  HPI :  Alejandra Taylor is here for regular f/u: T2D, HTN, Obesity, CKD G3a She reports medication compliance, denies SE She does not check BS at home and reports "I haven't felt a low or a high in so long". She is followed by psychiatry Q6M- reports that her mood is stable on current medication regime, denies thoughts of harming herself/others. She denies ETOH or illicit drug use. She denies formal exercise, however remains active with house/yard work and caring for her husband full time. Lab Results  Component Value Date   HGBA1C 6.7 (A) 03/17/2018   HGBA1C 7.0 (A) 12/11/2017   HGBA1C 5.4 08/08/2017   Last CPE 6/19  Patient Care Team    Relationship Specialty Notifications Start End  Esaw Grandchild, NP PCP - General Family Medicine  04/17/17   Melrose Nakayama, MD Consulting Physician Orthopedic Surgery  04/18/17   Juluis Rainier  Optometry  08/08/17     Patient Active Problem List   Diagnosis Date Noted  . Urinary incontinence 12/11/2017  . Constipation 12/11/2017  . Need for hepatitis C screening test 12/11/2017  . Diabetes mellitus without complication ([place]) 60/63/0160  . Healthcare maintenance 06/10/2017  . Colitis 05/24/2017  . Tobacco abuse 05/24/2017  . UTI (urinary tract infection) 05/24/2017  . Depression with anxiety 05/24/2017  . AKI (acute kidney injury) ([place]) 05/24/2017  . Hyponatremia 05/24/2017  . Migraine with status migrainosus, not intractable 04/17/2017  . Other chronic pain 04/17/2017  . Essential hypertension 04/17/2017  . PTSD (post-traumatic stress disorder) 04/17/2017  . Hypothyroidism 04/17/2017  . Hyperlipidemia 04/17/2017  . Endometrial cancer ([place]) 01/22/2012     Past Medical History:  Diagnosis Date  . Chronic back pain   . Colitis   . Diabetes mellitus   . 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 History of degenerative disc disease   .  Hypercholesterolemia   . Hypertension   . Hyperthyroidism   . Migraines   . Post traumatic stress disorder   . Spinal stenosis      Past Surgical History:  Procedure Laterality Date  . ABDOMINAL HYSTERECTOMY    . CHOLECYSTECTOMY    . GANGLION CYST EXCISION     L wrist cyst removal with tendon rupture and repair  . ROBOTIC ASSISTED LAP VAGINAL HYSTERECTOMY  08/19/2009   BSO for endo ca  . TONSILLECTOMY       Family History  Problem Relation Age of Onset  . Hypertension Father   . Diabetes Father   . Heart attack Father   . Pancreatic cancer Father   . Colon cancer Maternal Aunt   . Ovarian cancer Maternal Aunt   . Ovarian cancer Paternal Grandmother   . Stroke Paternal Grandmother   . Diabetes Paternal Grandmother   . Ovarian cancer Cousin   . Breast cancer Cousin      Social History   Substance and Sexual Activity  Drug Use No     Social History   Substance and Sexual Activity  Alcohol Use No     Social History   Tobacco Use  Smoking Status Current Every Day Smoker  . Packs/day: 1.00  . Years: 30.00  . Pack years: 30.00  . Types: Cigarettes  Smokeless Tobacco Never Used     Outpatient Encounter Medications as of 03/17/2018  Medication Sig Note  . albuterol (PROVENTIL HFA;VENTOLIN HFA) 108 (90 BASE)  MCG/ACT inhaler Inhale 2 puffs into the lungs every 6 (six) hours as needed for wheezing or shortness of breath.    . clonazePAM (KLONOPIN) 1 MG tablet Take 1 mg by mouth 3 (three) times daily. 04/07/2015: Received from: External Pharmacy  . estazolam (PROSOM) 2 MG tablet TAKE 1 TABLET AT BEDTIME AS NEEDED FOR SLEEP MAY REPEAT ONCE AS NEEDED ONLY IF MID-AWAKENING 04/07/2015: Received from: External Pharmacy  . ezetimibe (ZETIA) 10 MG tablet Take 1 tablet (10 mg total) by mouth daily.   Marland Kitchen glucose blood test strip 1 each by Other route as needed. Use as instructed   . HYDROcodone-acetaminophen (NORCO) 10-325 MG tablet TAKE 1 OR 2 TABLETS BY MOUTH EVERY 6  HOURS AS NEEDED FOR PAIN MUST LAST 30 DAYS 04/07/2015: Received from: External Pharmacy  . levothyroxine (SYNTHROID, LEVOTHROID) 125 MCG tablet Take 1 tablet (125 mcg total) by mouth daily.   Marland Kitchen linaclotide (LINZESS) 72 MCG capsule Take 1 capsule (72 mcg total) by mouth daily before breakfast.   . lisinopril-hydrochlorothiazide (PRINZIDE,ZESTORETIC) 20-12.5 MG tablet Take 1 tablet by mouth 2 (two) times daily.   Marland Kitchen loperamide (IMODIUM) 2 MG capsule Take 2 mg by mouth as needed for diarrhea or loose stools.   . meloxicam (MOBIC) 15 MG tablet TAKE 1 TABLET BY MOUTH DAILY WITH FOOD AS NEEDED FOR PAIN   . metFORMIN (GLUCOPHAGE) 500 MG tablet Take 1 tablet (500 mg total) by mouth 2 (two) times daily with a meal.   . methocarbamol (ROBAXIN) 500 MG tablet Take 500 mg by mouth 2 (two) times daily.   Marland Kitchen NARCAN 4 MG/0.1ML LIQD nasal spray kit 1 AS NEEDED FOR OVERDOSE   . pantoprazole (PROTONIX) 40 MG tablet Take 1 tablet (40 mg total) by mouth daily.   . QUEtiapine (SEROQUEL) 300 MG tablet Take 300 mg by mouth at bedtime.   . simvastatin (ZOCOR) 40 MG tablet TAKE 1 TABLET BY MOUTH EVERY DAY IN THE EVENING   . tiZANidine (ZANAFLEX) 4 MG capsule TAKE 1 CAP AS NEEDED ONCE A DAY FOR MUSCLE SPASM   . topiramate (TOPAMAX) 100 MG tablet Take 2 tablets (200 mg total) by mouth at bedtime.   Marland Kitchen venlafaxine XR (EFFEXOR-XR) 150 MG 24 hr capsule Take 150 mg by mouth. Take 2 capsules every morning and 1 capsule at night   . [DISCONTINUED] pantoprazole (PROTONIX) 40 MG tablet TAKE 1 TABLET BY MOUTH EVERY DAY   . [DISCONTINUED] QUEtiapine (SEROQUEL XR) 300 MG 24 hr tablet Take 300 mg by mouth at bedtime.   . [DISCONTINUED] QUEtiapine (SEROQUEL) 300 MG tablet TAKE 1 TABLET BY MOUTH AT NIGHT 1 HOUR BEFORE BED    No facility-administered encounter medications on file as of 03/17/2018.     Allergies: Other and Penicillins  Body mass index is 31.29 kg/m.  Blood pressure 117/74, pulse 92, height _0  (1.702 m), weight 199  lb 12.8 oz (90.6 kg).  Review of Systems  Constitutional: Positive for fatigue. Negative for activity change, appetite change, chills, diaphoresis, fever and unexpected weight change.  Eyes: Negative for visual disturbance.  Respiratory: Negative for cough, chest tightness, shortness of breath, wheezing and stridor.   Cardiovascular: Negative for chest pain, palpitations and leg swelling.  Gastrointestinal: Positive for constipation. Negative for abdominal distention, abdominal pain, blood in stool, diarrhea, nausea and vomiting.  Genitourinary: Positive for difficulty urinating. Negative for flank pain, frequency and urgency.  Musculoskeletal: Positive for arthralgias, back pain, gait problem, joint swelling, myalgias, neck pain and neck stiffness.  Skin: Positive for color change. Negative for pallor, rash and wound.  Neurological: Negative for dizziness and headaches.  Hematological: Does not bruise/bleed easily.  Psychiatric/Behavioral: Positive for dysphoric mood. Negative for behavioral problems, decreased concentration, hallucinations, self-injury, sleep disturbance and suicidal ideas. The patient is nervous/anxious. The patient is not hyperactive.        Objective:   Physical Exam  Constitutional: She is oriented to person, place, and time. She appears well-developed and well-nourished. No distress.  HENT:  Head: Normocephalic and atraumatic.  Right Ear: External ear normal. No decreased hearing is noted.  Left Ear: External ear normal. No decreased hearing is noted.  Nose: No mucosal edema. Right sinus exhibits no maxillary sinus tenderness and no frontal sinus tenderness. Left sinus exhibits no maxillary sinus tenderness and no frontal sinus tenderness.  Mouth/Throat: Oropharynx is clear and moist and mucous membranes are normal. Abnormal dentition. Dental caries present. No oropharyngeal exudate, posterior oropharyngeal edema, posterior oropharyngeal erythema or tonsillar  abscesses.  Eyes: Pupils are equal, round, and reactive to light. Conjunctivae and EOM are normal.  Cardiovascular: Normal rate, regular rhythm, normal heart sounds and intact distal pulses.  No murmur heard. Pulmonary/Chest: Effort normal and breath sounds normal. No stridor. No respiratory distress. She has no wheezes. She exhibits no tenderness.  Abdominal:  Pendulous abdomen  Musculoskeletal: Normal range of motion.  Neurological: She is alert and oriented to person, place, and time.  Skin: Skin is warm and dry. Capillary refill takes less than 2 seconds. No rash noted. She is not diaphoretic. No erythema. No pallor.  Psychiatric: She has a normal mood and affect. Her behavior is normal. Judgment and thought content normal.  Nursing note and vitals reviewed.     Assessment & Plan:   1. Essential hypertension   2. Hyperlipidemia, unspecified hyperlipidemia type   3. Diabetes mellitus without complication ([place])   4. Healthcare maintenance   5. Depression with anxiety     Essential hypertension BP at goal 117/64, HR 92 Continue Prinzide 20/12.55m QD  Diabetes mellitus without complication ([facility]) Lab Results  Component Value Date   HGBA1C 6.7 (A) 03/17/2018   HGBA1C 7.0 (A) 12/11/2017   HGBA1C 5.4 08/08/2017   She denies episodes of hypoglycemia/hyperglcemia She has been on Metformin >20 years w/o SE Currently on Metformin [id number]   Healthcare maintenance Continue all medications as directed. Follow Diabetic Diet- great job on A1c- 6.7! Continue with psychiatry and pain management as directed. Increase plain water intake and move as often as possible. Please schedule follow-up appt with Dr. OpRaliegh Scarletor dermatological procedures. Follow- up in 3 months for chronic medical conditions.  Depression with anxiety Mood stable Denies thoughts of harming himself/others Followed by psychiatry Q6M     FOLLOW-UP:  Return in about 3 months (around 06/16/2018) for Regular  Follow Up, HTN, Obesity, Diabetes.

## 2018-03-13 ENCOUNTER — Ambulatory Visit: Payer: Medicare Other | Admitting: Adult Health

## 2018-03-17 ENCOUNTER — Ambulatory Visit (INDEPENDENT_AMBULATORY_CARE_PROVIDER_SITE_OTHER): Payer: BLUE CROSS/BLUE SHIELD | Admitting: Adult Health

## 2018-03-17 ENCOUNTER — Encounter: Payer: Self-pay | Admitting: Adult Health

## 2018-03-17 VITALS — BP 117/74 | HR 92 | Ht 67.0 in | Wt 199.8 lb

## 2018-03-17 DIAGNOSIS — F418 Other specified anxiety disorders: Secondary | ICD-10-CM

## 2018-03-17 DIAGNOSIS — E785 Hyperlipidemia, unspecified: Secondary | ICD-10-CM

## 2018-03-17 DIAGNOSIS — Z0000 Encounter for general adult medical examination without abnormal findings: Secondary | ICD-10-CM | POA: Diagnosis not present

## 2018-03-17 DIAGNOSIS — I10 Essential (primary) hypertension: Secondary | ICD-10-CM

## 2018-03-17 DIAGNOSIS — E119 Type 2 diabetes mellitus without complications: Secondary | ICD-10-CM | POA: Diagnosis not present

## 2018-03-17 LAB — POCT GLYCOSYLATED HEMOGLOBIN (HGB A1C): Hemoglobin A1C: 6.7 % — AB (ref 4.0–5.6)

## 2018-03-17 MED ORDER — PANTOPRAZOLE SODIUM 40 MG PO TBEC: 40.0000 mg | DELAYED_RELEASE_TABLET | Freq: Every day | ORAL | 0 refills | Status: DC

## 2018-03-17 NOTE — Assessment & Plan Note (Signed)
BP at goal 117/64, HR 92 Continue Prinzide 20/12.5mg  QD

## 2018-03-17 NOTE — Assessment & Plan Note (Signed)
Continue all medications as directed. Follow Diabetic Diet- great job on A1c- 6.7! Continue with psychiatry and pain management as directed. Increase plain water intake and move as often as possible. Please schedule follow-up appt with Dr. Raliegh Scarlet for dermatological procedures. Follow- up in 3 months for chronic medical conditions.

## 2018-03-17 NOTE — Assessment & Plan Note (Signed)
Lab Results  Component Value Date   HGBA1C 6.7 (A) 03/17/2018   HGBA1C 7.0 (A) 12/11/2017   HGBA1C 5.4 08/08/2017   She denies episodes of hypoglycemia/hyperglcemia She has been on Metformin >20 years w/o SE Currently on Metformin 500mg  QD

## 2018-03-17 NOTE — Assessment & Plan Note (Signed)
Mood stable Denies thoughts of harming himself/others Followed by psychiatry Q6M

## 2018-03-17 NOTE — Patient Instructions (Signed)
Diabetes Mellitus and Nutrition When you have diabetes (diabetes mellitus), it is very important to have healthy eating habits because your blood sugar (glucose) levels are greatly affected by what you eat and drink. Eating healthy foods in the appropriate amounts, at about the same times every day, can help you:  Control your blood glucose.  Lower your risk of heart disease.  Improve your blood pressure.  Reach or maintain a healthy weight.  Every person with diabetes is different, and each person has different needs for a meal plan. Your health care provider may recommend that you work with a diet and nutrition specialist (dietitian) to make a meal plan that is best for you. Your meal plan may vary depending on factors such as:  The calories you need.  The medicines you take.  Your weight.  Your blood glucose, blood pressure, and cholesterol levels.  Your activity level.  Other health conditions you have, such as heart or kidney disease.  How do carbohydrates affect me? Carbohydrates affect your blood glucose level more than any other type of food. Eating carbohydrates naturally increases the amount of glucose in your blood. Carbohydrate counting is a method for keeping track of how many carbohydrates you eat. Counting carbohydrates is important to keep your blood glucose at a healthy level, especially if you use insulin or take certain oral diabetes medicines. It is important to know how many carbohydrates you can safely have in each meal. This is different for every person. Your dietitian can help you calculate how many carbohydrates you should have at each meal and for snack. Foods that contain carbohydrates include:  Bread, cereal, rice, pasta, and crackers.  Potatoes and corn.  Peas, beans, and lentils.  Milk and yogurt.  Fruit and juice.  Desserts, such as cakes, cookies, ice cream, and candy.  How does alcohol affect me? Alcohol can cause a sudden decrease in blood  glucose (hypoglycemia), especially if you use insulin or take certain oral diabetes medicines. Hypoglycemia can be a life-threatening condition. Symptoms of hypoglycemia (sleepiness, dizziness, and confusion) are similar to symptoms of having too much alcohol. If your health care provider says that alcohol is safe for you, follow these guidelines:  Limit alcohol intake to no more than 1 drink per day for nonpregnant women and 2 drinks per day for men. One drink equals 12 oz of beer, 5 oz of wine, or 1 oz of hard liquor.  Do not drink on an empty stomach.  Keep yourself hydrated with water, diet soda, or unsweetened iced tea.  Keep in mind that regular soda, juice, and other mixers may contain a lot of sugar and must be counted as carbohydrates.  What are tips for following this plan? Reading food labels  Start by checking the serving size on the label. The amount of calories, carbohydrates, fats, and other nutrients listed on the label are based on one serving of the food. Many foods contain more than one serving per package.  Check the total grams (g) of carbohydrates in one serving. You can calculate the number of servings of carbohydrates in one serving by dividing the total carbohydrates by 15. For example, if a food has 30 g of total carbohydrates, it would be equal to 2 servings of carbohydrates.  Check the number of grams (g) of saturated and trans fats in one serving. Choose foods that have low or no amount of these fats.  Check the number of milligrams (mg) of sodium in one serving. Most people   should limit total sodium intake to less than 2,300 mg per day.  Always check the nutrition information of foods labeled as "low-fat" or "nonfat". These foods may be higher in added sugar or refined carbohydrates and should be avoided.  Talk to your dietitian to identify your daily goals for nutrients listed on the label. Shopping  Avoid buying canned, premade, or processed foods. These  foods tend to be high in fat, sodium, and added sugar.  Shop around the outside edge of the grocery store. This includes fresh fruits and vegetables, bulk grains, fresh meats, and fresh dairy. Cooking  Use low-heat cooking methods, such as baking, instead of high-heat cooking methods like deep frying.  Cook using healthy oils, such as olive, canola, or sunflower oil.  Avoid cooking with butter, cream, or high-fat meats. Meal planning  Eat meals and snacks regularly, preferably at the same times every day. Avoid going long periods of time without eating.  Eat foods high in fiber, such as fresh fruits, vegetables, beans, and whole grains. Talk to your dietitian about how many servings of carbohydrates you can eat at each meal.  Eat 4-6 ounces of lean protein each day, such as lean meat, chicken, fish, eggs, or tofu. 1 ounce is equal to 1 ounce of meat, chicken, or fish, 1 egg, or 1/4 cup of tofu.  Eat some foods each day that contain healthy fats, such as avocado, nuts, seeds, and fish. Lifestyle   Check your blood glucose regularly.  Exercise at least 30 minutes 5 or more days each week, or as told by your health care provider.  Take medicines as told by your health care provider.  Do not use any products that contain nicotine or tobacco, such as cigarettes and e-cigarettes. If you need help quitting, ask your health care provider.  Work with a Social worker or diabetes educator to identify strategies to manage stress and any emotional and social challenges. What are some questions to ask my health care provider?  Do I need to meet with a diabetes educator?  Do I need to meet with a dietitian?  What number can I call if I have questions?  When are the best times to check my blood glucose? Where to find more information:  American Diabetes Association: diabetes.org/food-and-fitness/food  Academy of Nutrition and Dietetics:  PokerClues.dk  [employer] of Diabetes and Digestive and Kidney Diseases (NIH): ContactWire.be Summary  A healthy meal plan will help you control your blood glucose and maintain a healthy lifestyle.  Working with a diet and nutrition specialist (dietitian) can help you make a meal plan that is best for you.  Keep in mind that carbohydrates and alcohol have immediate effects on your blood glucose levels. It is important to count carbohydrates and to use alcohol carefully. This information is not intended to replace advice given to you by your health care provider. Make sure you discuss any questions you have with your health care provider. Document Released: 03/08/2005 Document Revised: 07/16/2016 Document Reviewed: 07/16/2016 Elsevier Interactive Patient Education  2018 [place] all medications as directed. Follow Diabetic Diet- great job on A1c- 6.7! Continue with psychiatry and pain management as directed. Increase plain water intake and move as often as possible. Please schedule follow-up appt with Dr. Raliegh Scarlet for dermatological procedures. Follow- up in 3 months for chronic medical conditions. NICE TO SEE YOU!

## 2018-03-18 ENCOUNTER — Other Ambulatory Visit: Payer: Self-pay | Admitting: Adult Health

## 2018-03-18 DIAGNOSIS — N183 Chronic kidney disease, stage 3 unspecified: Secondary | ICD-10-CM

## 2018-03-18 LAB — COMPREHENSIVE METABOLIC PANEL
ALT: 19 IU/L (ref 0–32)
AST: 14 IU/L (ref 0–40)
Albumin/Globulin Ratio: 2 (ref 1.2–2.2)
Albumin: 4.4 g/dL (ref 3.5–5.5)
Alkaline Phosphatase: 100 IU/L (ref 39–117)
BUN/Creatinine Ratio: 14 (ref 9–23)
BUN: 20 mg/dL (ref 6–24)
Bilirubin Total: 0.2 mg/dL (ref 0.0–1.2)
CO2: 23 mmol/L (ref 20–29)
Calcium: 9.7 mg/dL (ref 8.7–10.2)
Chloride: 90 mmol/L — ABNORMAL LOW (ref 96–106)
Creatinine, Ser: 1.44 mg/dL — ABNORMAL HIGH (ref 0.57–1.00)
GFR calc Af Amer: 47 mL/min/{1.73_m2} — ABNORMAL LOW (ref >59)
GFR calc non Af Amer: 40 mL/min/{1.73_m2} — ABNORMAL LOW (ref >59)
Globulin, Total: 2.2 g/dL (ref 1.5–4.5)
Glucose: 146 mg/dL — ABNORMAL HIGH (ref 65–99)
Potassium: 4 mmol/L (ref 3.5–5.2)
Sodium: 132 mmol/L — ABNORMAL LOW (ref 134–144)
Total Protein: 6.6 g/dL (ref 6.0–8.5)

## 2018-03-18 MED ORDER — TIZANIDINE HCL 2 MG PO CAPS: ORAL_CAPSULE | ORAL | 0 refills | Status: DC

## 2018-03-18 MED ORDER — VENLAFAXINE HCL ER 150 MG PO CP24: ORAL_CAPSULE | ORAL | 3 refills | Status: DC

## 2018-03-18 MED ORDER — TOPIRAMATE 100 MG PO TABS: 100.0000 mg | ORAL_TABLET | Freq: Every day | ORAL | 1 refills | Status: DC

## 2018-03-18 MED ORDER — METFORMIN HCL 500 MG PO TABS: 500.0000 mg | ORAL_TABLET | Freq: Every day | ORAL | 2 refills | Status: DC

## 2018-03-31 ENCOUNTER — Telehealth: Payer: Self-pay | Admitting: Adult Health

## 2018-03-31 NOTE — Telephone Encounter (Signed)
Patient has clinical questions about why we are sending her to a nephrologist. Please advise

## 2018-03-31 NOTE — Telephone Encounter (Signed)
Pt had questions regarding renal function tests and referral to nephrologist.  All of pt's questions answered and pt expressed understanding.  She does have an appointment scheduled.  Charyl Bigger, CMA

## 2018-04-05 ENCOUNTER — Other Ambulatory Visit: Payer: Self-pay | Admitting: Adult Health

## 2018-04-11 ENCOUNTER — Ambulatory Visit: Payer: Medicare Other | Admitting: Family Medicine

## 2018-04-16 ENCOUNTER — Ambulatory Visit (INDEPENDENT_AMBULATORY_CARE_PROVIDER_SITE_OTHER): Payer: BLUE CROSS/BLUE SHIELD | Admitting: Adult Health

## 2018-04-16 ENCOUNTER — Encounter: Payer: Self-pay | Admitting: Adult Health

## 2018-04-16 VITALS — BP 135/84 | HR 110 | Ht 67.0 in | Wt 195.7 lb

## 2018-04-16 DIAGNOSIS — L089 Local infection of the skin and subcutaneous tissue, unspecified: Secondary | ICD-10-CM

## 2018-04-16 DIAGNOSIS — T148XXA Other injury of unspecified body region, initial encounter: Secondary | ICD-10-CM

## 2018-04-16 MED ORDER — DOXYCYCLINE HYCLATE 100 MG PO TABS: 100.0000 mg | ORAL_TABLET | Freq: Two times a day (BID) | ORAL | 0 refills | Status: DC

## 2018-04-16 MED ORDER — BACITRACIN 500 UNIT/GM EX OINT: 1.0000 "application " | TOPICAL_OINTMENT | Freq: Every day | CUTANEOUS | 0 refills | Status: DC

## 2018-04-16 NOTE — Progress Notes (Signed)
Subjective:    Patient ID: Alejandra Taylor, female    DOB: 04-08-1961, 57 y.o.   MRN: [id number]  HPI:  Alejandra Taylor fell asleep while using an old heating pad 3 weeks ago.  She sustained a burn that has been slow to heal and is here for evaluation. She denies pain, reports mild itching at site.  She denies fever/night sweat/chills/malaise/poor appetite. She denies drainage.  She has been cleaning site daily with hydrogen peroxide and covering large band aid. She continues to smoke pack/day  Patient Care Team    Relationship Specialty Notifications Start End  Alejandra Marble D, NP PCP - General Family Medicine  04/17/17   Alejandra Nakayama, MD Consulting Physician Orthopedic Surgery  04/18/17   Juluis Rainier  Optometry  08/08/17     Patient Active Problem List   Diagnosis Date Noted  . Wound infection 04/16/2018  . Urinary incontinence 12/11/2017  . Constipation 12/11/2017  . Need for hepatitis C screening test 12/11/2017  . Diabetes mellitus without complication ([place]) 35/32/9924  . Healthcare maintenance 06/10/2017  . Colitis 05/24/2017  . Tobacco abuse 05/24/2017  . UTI (urinary tract infection) 05/24/2017  . Depression with anxiety 05/24/2017  . AKI (acute kidney injury) ([place]) 05/24/2017  . Hyponatremia 05/24/2017  . Migraine with status migrainosus, not intractable 04/17/2017  . Other chronic pain 04/17/2017  . Essential hypertension 04/17/2017  . PTSD (post-traumatic stress disorder) 04/17/2017  . Hypothyroidism 04/17/2017  . Hyperlipidemia 04/17/2017  . Endometrial cancer ([place]) 01/22/2012     Past Medical History:  Diagnosis Date  . Chronic back pain   . Colitis   . Diabetes mellitus   . 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 History of degenerative disc disease   . Hypercholesterolemia   . Hypertension   . Hyperthyroidism   . Migraines   . Post traumatic stress disorder   . Spinal stenosis      Past Surgical History:  Procedure Laterality Date  .  ABDOMINAL HYSTERECTOMY    . CHOLECYSTECTOMY    . GANGLION CYST EXCISION     L wrist cyst removal with tendon rupture and repair  . ROBOTIC ASSISTED LAP VAGINAL HYSTERECTOMY  08/19/2009   BSO for endo ca  . TONSILLECTOMY       Family History  Problem Relation Age of Onset  . Hypertension Father   . Diabetes Father   . Heart attack Father   . Pancreatic cancer Father   . Colon cancer Maternal Aunt   . Ovarian cancer Maternal Aunt   . Ovarian cancer Paternal Grandmother   . Stroke Paternal Grandmother   . Diabetes Paternal Grandmother   . Ovarian cancer Cousin   . Breast cancer Cousin      Social History   Substance and Sexual Activity  Drug Use No     Social History   Substance and Sexual Activity  Alcohol Use No     Social History   Tobacco Use  Smoking Status Current Every Day Smoker  . Packs/day: 1.00  . Years: 30.00  . Pack years: 30.00  . Types: Cigarettes  Smokeless Tobacco Never Used     Outpatient Encounter Medications as of 04/16/2018  Medication Sig Note  . albuterol (PROVENTIL HFA;VENTOLIN HFA) 108 (90 BASE) MCG/ACT inhaler Inhale 2 puffs into the lungs every 6 (six) hours as needed for wheezing or shortness of breath.    . clonazePAM (KLONOPIN) 1 MG tablet Take 1 mg by mouth 3 (three) times daily. 04/07/2015: Received from:  External Pharmacy  . estazolam (PROSOM) 2 MG tablet TAKE 1 TABLET AT BEDTIME AS NEEDED FOR SLEEP MAY REPEAT ONCE AS NEEDED ONLY IF MID-AWAKENING 04/07/2015: Received from: External Pharmacy  . ezetimibe (ZETIA) 10 MG tablet Take 1 tablet (10 mg total) by mouth daily.   Marland Kitchen glucose blood test strip 1 each by Other route as needed. Use as instructed   . HYDROcodone-acetaminophen (NORCO) 10-325 MG tablet TAKE 1 OR 2 TABLETS BY MOUTH EVERY 6 HOURS AS NEEDED FOR PAIN MUST LAST 30 DAYS 04/07/2015: Received from: External Pharmacy  . levothyroxine (SYNTHROID, LEVOTHROID) 125 MCG tablet Take 1 tablet (125 mcg total) by mouth daily.   Marland Kitchen  linaclotide (LINZESS) 72 MCG capsule Take 1 capsule (72 mcg total) by mouth daily before breakfast.   . lisinopril-hydrochlorothiazide (PRINZIDE,ZESTORETIC) 20-12.5 MG tablet Take 1 tablet by mouth 2 (two) times daily.   Marland Kitchen loperamide (IMODIUM) 2 MG capsule Take 2 mg by mouth as needed for diarrhea or loose stools.   . metFORMIN (GLUCOPHAGE) 500 MG tablet Take 1 tablet (500 mg total) by mouth daily with breakfast.   . methocarbamol (ROBAXIN) 500 MG tablet Take 500 mg by mouth 2 (two) times daily.   Marland Kitchen NARCAN 4 MG/0.1ML LIQD nasal spray kit 1 AS NEEDED FOR OVERDOSE   . pantoprazole (PROTONIX) 40 MG tablet Take 1 tablet (40 mg total) by mouth daily.   . QUEtiapine (SEROQUEL) 300 MG tablet Take 300 mg by mouth at bedtime.   . simvastatin (ZOCOR) 40 MG tablet TAKE 1 TABLET BY MOUTH EVERY DAY IN THE EVENING   . tiZANidine (ZANAFLEX) 2 MG capsule TAKE 1 CAP AS NEEDED ONCE A DAY FOR MUSCLE SPASM   . topiramate (TOPAMAX) 100 MG tablet Take 1 tablet (100 mg total) by mouth at bedtime. Max dose is 181m at bedtime   . venlafaxine XR (EFFEXOR-XR) 150 MG 24 hr capsule Take 1 capsule every morning and 1 capsule at night   . bacitracin 500 UNIT/GM ointment Apply 1 application topically daily.   .Marland Kitchendoxycycline (VIBRA-TABS) 100 MG tablet Take 1 tablet (100 mg total) by mouth 2 (two) times daily.   . [DISCONTINUED] meloxicam (MOBIC) 15 MG tablet TAKE 1 TABLET BY MOUTH DAILY WITH FOOD AS NEEDED FOR PAIN    No facility-administered encounter medications on file as of 04/16/2018.     Allergies: Other and Penicillins  Body mass index is 30.65 kg/m.  Blood pressure 135/84, pulse (!) 110, height '5\' 7"'  (1.702 m), weight 195 lb 11.2 oz (88.8 kg), SpO2 96 %.  Review of Systems  Constitutional: Positive for fatigue. Negative for activity change, appetite change, chills, diaphoresis, fever and unexpected weight change.  Respiratory: Negative for cough, chest tightness, shortness of breath, wheezing and stridor.     Cardiovascular: Negative for chest pain, palpitations and leg swelling.  Skin: Positive for color change and wound. Negative for pallor and rash.  Hematological: Does not bruise/bleed easily.       Objective:   Physical Exam  Constitutional: She is oriented to person, place, and time. She appears well-developed and well-nourished. No distress.  Cardiovascular: Normal rate, regular rhythm, normal heart sounds and intact distal pulses.  No murmur heard. Pulmonary/Chest: Effort normal and breath sounds normal. No stridor. No respiratory distress. She has no wheezes. She has no rales. She exhibits no tenderness.  Neurological: She is alert and oriented to person, place, and time.  Skin: Skin is warm and dry. Capillary refill takes less than 2 seconds. No rash  noted. She is not diaphoretic. There is erythema. No pallor.     3cm oblong area of erythema with 2.0 cm center with yellow slough. No drainage noted. Area slightly warm to the touch. No streaking noted  Psychiatric: She has a normal mood and affect. Her behavior is normal. Judgment and thought content normal.      Assessment & Plan:   1. Wound infection     Wound infection Please take Doxycycline and Bacitracin as directed. Keep wound clean and dry. Reduce to stop tobacco use. If wound does not completely heal after antibiotic completed, then please make office visit.   FOLLOW-UP:  Return if symptoms worsen or fail to improve.

## 2018-04-16 NOTE — Assessment & Plan Note (Signed)
Please take Doxycycline and Bacitracin as directed. Keep wound clean and dry. Reduce to stop tobacco use. If wound does not completely heal after antibiotic completed, then please make office visit.

## 2018-04-16 NOTE — Patient Instructions (Addendum)

## 2018-04-24 ENCOUNTER — Telehealth: Payer: Self-pay | Admitting: Adult Health

## 2018-04-24 NOTE — Telephone Encounter (Signed)
Patient seen for burn on back and called this morning stating that she was advised to call our office if it did not improve. She states that it has healed slightly but still has not improved like she had hoped. States she is due for back injections at another office next week but was advised that if it was not fully healed then they could not do the procedure. She wants to speak with nurse about what can be done to aid in healing rate. Please advise

## 2018-04-24 NOTE — Telephone Encounter (Signed)
Advised pt that she needs OV for recheck.  Charyl Bigger, CMA

## 2018-04-28 NOTE — Progress Notes (Signed)
Subjective:    Patient ID: Alejandra Taylor, female    DOB: March 13, 1961, 57 y.o.   MRN: [id number]  HPI: 04/16/18 OV: [name] fell asleep while using an old heating pad 3 weeks ago.  She sustained a burn that has been slow to heal and is here for evaluation. She denies pain, reports mild itching at site.  She denies fever/night sweat/chills/malaise/poor appetite. She denies drainage.  She has been cleaning site daily with hydrogen peroxide and covering large band aid. She continues to smoke pack/day  04/29/18 OV: [name] is here for f/u: wound She completed course of doxycycline 3 days ago She denies pain/drainage/streaking at site She denies fever/night sweats She has new complaint-productive cough (thick/yellow mucus), clear nasal drainage, facial pressure, and fatigue the last 2 weeks. She continues to smoke tobacco, however has cut back to 1/2 pack/day. She denies N/V/D   Patient Care Team    Relationship Specialty Notifications Start End  Mina Marble D, NP PCP - General Family Medicine  04/17/17   [place] Nakayama, MD Consulting Physician Orthopedic Surgery  04/18/17   Juluis Rainier  Optometry  08/08/17     Patient Active Problem List   Diagnosis Date Noted  . Bronchitis 04/29/2018  . Wound infection 04/16/2018  . Urinary incontinence 12/11/2017  . Constipation 12/11/2017  . Need for hepatitis C screening test 12/11/2017  . Diabetes mellitus without complication ([place]) 81/06/7508  . Healthcare maintenance 06/10/2017  . Colitis 05/24/2017  . Tobacco abuse 05/24/2017  . UTI (urinary tract infection) 05/24/2017  . Depression with anxiety 05/24/2017  . AKI (acute kidney injury) ([place]) 05/24/2017  . Hyponatremia 05/24/2017  . Migraine with status migrainosus, not intractable 04/17/2017  . Other chronic pain 04/17/2017  . Essential hypertension 04/17/2017  . PTSD (post-traumatic stress disorder) 04/17/2017  . Hypothyroidism 04/17/2017  . Hyperlipidemia  04/17/2017  . Endometrial cancer ([place]) 01/22/2012     Past Medical History:  Diagnosis Date  . Chronic back pain   . Colitis   . Diabetes mellitus   . 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 History of degenerative disc disease   . Hypercholesterolemia   . Hypertension   . Hyperthyroidism   . Migraines   . Post traumatic stress disorder   . Spinal stenosis      Past Surgical History:  Procedure Laterality Date  . ABDOMINAL HYSTERECTOMY    . CHOLECYSTECTOMY    . GANGLION CYST EXCISION     L wrist cyst removal with tendon rupture and repair  . ROBOTIC ASSISTED LAP VAGINAL HYSTERECTOMY  08/19/2009   BSO for endo ca  . TONSILLECTOMY       Family History  Problem Relation Age of Onset  . Hypertension Father   . Diabetes Father   . Heart attack Father   . Pancreatic cancer Father   . Colon cancer Maternal Aunt   . Ovarian cancer Maternal Aunt   . Ovarian cancer Paternal Grandmother   . Stroke Paternal Grandmother   . Diabetes Paternal Grandmother   . Ovarian cancer Cousin   . Breast cancer Cousin      Social History   Substance and Sexual Activity  Drug Use No     Social History   Substance and Sexual Activity  Alcohol Use No     Social History   Tobacco Use  Smoking Status Current Every Day Smoker  . Packs/day: 1.00  . Years: 30.00  . Pack years: 30.00  . Types: Cigarettes  Smokeless Tobacco  Never Used     Outpatient Encounter Medications as of 04/29/2018  Medication Sig Note  . albuterol (PROVENTIL HFA;VENTOLIN HFA) 108 (90 BASE) MCG/ACT inhaler Inhale 2 puffs into the lungs every 6 (six) hours as needed for wheezing or shortness of breath.    . bacitracin 500 UNIT/GM ointment Apply 1 application topically daily.   . clonazePAM (KLONOPIN) 1 MG tablet Take 1 mg by mouth 3 (three) times daily. 04/07/2015: Received from: External Pharmacy  . estazolam (PROSOM) 2 MG tablet TAKE 1 TABLET AT BEDTIME AS NEEDED FOR SLEEP MAY REPEAT ONCE AS NEEDED  ONLY IF MID-AWAKENING 04/07/2015: Received from: External Pharmacy  . ezetimibe (ZETIA) 10 MG tablet Take 1 tablet (10 mg total) by mouth daily.   Marland Kitchen glucose blood test strip 1 each by Other route as needed. Use as instructed   . HYDROcodone-acetaminophen (NORCO) 10-325 MG tablet TAKE 1 OR 2 TABLETS BY MOUTH EVERY 6 HOURS AS NEEDED FOR PAIN MUST LAST 30 DAYS 04/07/2015: Received from: External Pharmacy  . levothyroxine (SYNTHROID, LEVOTHROID) 125 MCG tablet Take 1 tablet (125 mcg total) by mouth daily.   Marland Kitchen linaclotide (LINZESS) 72 MCG capsule Take 1 capsule (72 mcg total) by mouth daily before breakfast.   . lisinopril-hydrochlorothiazide (PRINZIDE,ZESTORETIC) 20-12.5 MG tablet Take 1 tablet by mouth 2 (two) times daily.   Marland Kitchen loperamide (IMODIUM) 2 MG capsule Take 2 mg by mouth as needed for diarrhea or loose stools.   . metFORMIN (GLUCOPHAGE) 500 MG tablet Take 1 tablet (500 mg total) by mouth daily with breakfast.   . NARCAN 4 MG/0.1ML LIQD nasal spray kit 1 AS NEEDED FOR OVERDOSE   . pantoprazole (PROTONIX) 40 MG tablet Take 1 tablet (40 mg total) by mouth daily.   . QUEtiapine (SEROQUEL) 300 MG tablet Take 300 mg by mouth at bedtime.   . simvastatin (ZOCOR) 40 MG tablet TAKE 1 TABLET BY MOUTH EVERY DAY IN THE EVENING   . tiZANidine (ZANAFLEX) 2 MG capsule TAKE 1 CAP AS NEEDED ONCE A DAY FOR MUSCLE SPASM   . topiramate (TOPAMAX) 100 MG tablet Take 1 tablet (100 mg total) by mouth at bedtime. Max dose is 159m at bedtime   . venlafaxine XR (EFFEXOR-XR) 150 MG 24 hr capsule Take 1 capsule every morning and 1 capsule at night   . benzonatate (TESSALON) 200 MG capsule Take 1 capsule (200 mg total) by mouth 2 (two) times daily as needed for cough.   . clindamycin (CLEOCIN) 300 MG capsule Take 1 capsule (300 mg total) by mouth 3 (three) times daily.   . [DISCONTINUED] doxycycline (VIBRA-TABS) 100 MG tablet Take 1 tablet (100 mg total) by mouth 2 (two) times daily.   . [DISCONTINUED] methocarbamol  (ROBAXIN) 500 MG tablet Take 500 mg by mouth 2 (two) times daily.    No facility-administered encounter medications on file as of 04/29/2018.     Allergies: Other and Penicillins  Body mass index is 29.21 kg/m.  Blood pressure 114/75, pulse (!) 104, temperature 97.8 F (36.6 C), temperature source Oral, height 5' 7" (1.702 m), weight 186 lb 8 oz (84.6 kg), SpO2 100 %.  Review of Systems  Constitutional: Positive for fatigue. Negative for activity change, appetite change, chills, diaphoresis, fever and unexpected weight change.  HENT: Positive for congestion, facial swelling, postnasal drip, rhinorrhea and sinus pressure.   Eyes: Negative for visual disturbance.  Respiratory: Negative for cough, chest tightness, shortness of breath, wheezing and stridor.   Cardiovascular: Negative for chest pain, palpitations and  leg swelling.  Skin: Positive for color change and wound. Negative for pallor and rash.  Neurological: Negative for dizziness and headaches.  Hematological: Does not bruise/bleed easily.  Psychiatric/Behavioral: Positive for sleep disturbance.       Objective:   Physical Exam  Constitutional: She is oriented to person, place, and time. She appears well-developed and well-nourished. No distress.  HENT:  Head: Normocephalic and atraumatic.  Right Ear: External ear normal. Tympanic membrane is bulging. Tympanic membrane is not erythematous. No decreased hearing is noted.  Left Ear: External ear normal. Tympanic membrane is bulging. Tympanic membrane is not erythematous. No decreased hearing is noted.  Nose: Mucosal edema and rhinorrhea present. Right sinus exhibits no maxillary sinus tenderness and no frontal sinus tenderness. Left sinus exhibits no maxillary sinus tenderness and no frontal sinus tenderness.  Mouth/Throat: Mucous membranes are dry. Abnormal dentition. Posterior oropharyngeal edema and posterior oropharyngeal erythema present. No oropharyngeal exudate or  tonsillar abscesses. Tonsils are 1+ on the right. Tonsils are 1+ on the left. No tonsillar exudate.  Cardiovascular: Normal rate, regular rhythm, normal heart sounds and intact distal pulses.  No murmur heard. Pulmonary/Chest: Effort normal and breath sounds normal. No stridor. No respiratory distress. She has no wheezes. She has no rales. She exhibits no tenderness.  Neurological: She is alert and oriented to person, place, and time.  Skin: Skin is warm and dry. Capillary refill takes less than 2 seconds. No rash noted. She is not diaphoretic. There is erythema. No pallor.     2.5cm oblong area of erythema with 1.5 cm center with yellow slough. No drainage noted. No warm to the touch No streaking noted  Psychiatric: She has a normal mood and affect. Her behavior is normal. Judgment and thought content normal.      Assessment & Plan:   1. Wound infection   2. Bronchitis     Bronchitis Stop tobacco use Please take Clindamycin 365m every 8 hrs for 10 days- this will treat the wound and your bronchitis. Please stop using the antibiotic cream. Keep wound clean and dry as possible. Do not schedule any more back injection therapy until wound is completely healed. Please call clinic with any questions/concerns.  Wound infection Please take Clindamycin 3041mevery 8 hrs for 10 days- this will treat the wound and your bronchitis. Please stop using the antibiotic cream. Keep wound clean and dry as possible. Do not schedule any more back injection therapy until wound is completely healed. Please call clinic with any questions/concerns.  FOLLOW-UP:  Return if symptoms worsen or fail to improve.

## 2018-04-29 ENCOUNTER — Ambulatory Visit (INDEPENDENT_AMBULATORY_CARE_PROVIDER_SITE_OTHER): Payer: BLUE CROSS/BLUE SHIELD | Admitting: Adult Health

## 2018-04-29 ENCOUNTER — Encounter: Payer: Self-pay | Admitting: Adult Health

## 2018-04-29 VITALS — BP 114/75 | HR 104 | Temp 97.8°F | Ht 67.0 in | Wt 186.5 lb

## 2018-04-29 DIAGNOSIS — L089 Local infection of the skin and subcutaneous tissue, unspecified: Secondary | ICD-10-CM

## 2018-04-29 DIAGNOSIS — J40 Bronchitis, not specified as acute or chronic: Secondary | ICD-10-CM

## 2018-04-29 DIAGNOSIS — T148XXA Other injury of unspecified body region, initial encounter: Secondary | ICD-10-CM

## 2018-04-29 MED ORDER — BENZONATATE 200 MG PO CAPS: 200.0000 mg | ORAL_CAPSULE | Freq: Two times a day (BID) | ORAL | 0 refills | Status: DC | PRN

## 2018-04-29 MED ORDER — CLINDAMYCIN HCL 300 MG PO CAPS: 300.0000 mg | ORAL_CAPSULE | Freq: Three times a day (TID) | ORAL | 0 refills | Status: DC

## 2018-04-29 NOTE — Patient Instructions (Signed)
Wound Care, Adult Taking care of your wound properly can help to prevent pain and infection. It can also help your wound to heal more quickly. How is this treated? Wound care  Follow instructions from your health care provider about how to take care of your wound. Make sure you: ? Wash your hands with soap and water before you change the bandage (dressing). If soap and water are not available, use hand sanitizer. ? Change your dressing as told by your health care provider. ? Leave stitches (sutures), skin glue, or adhesive strips in place. These skin closures may need to stay in place for 2 weeks or longer. If adhesive strip edges start to loosen and curl up, you may trim the loose edges. Do not remove adhesive strips completely unless your health care provider tells you to do that.  Check your wound area every day for signs of infection. Check for: ? More redness, swelling, or pain. ? More fluid or blood. ? Warmth. ? Pus or a bad smell.  Ask your health care provider if you should clean the wound with mild soap and water. Doing this may include: ? Using a clean towel to pat the wound dry after cleaning it. Do not rub or scrub the wound. ? Applying a cream or ointment. Do this only as told by your health care provider. ? Covering the incision with a clean dressing.  Ask your health care provider when you can leave the wound uncovered. Medicines   If you were prescribed an antibiotic medicine, take or use the antibiotic as told by your health care provider. Do not stop taking or using the antibiotic even if your condition improves.  Take over-the-counter and prescription medicines only as told by your health care provider. If you were prescribed pain medicine, take it at least 30 minutes before doing any wound care or as told by your health care provider. General instructions  Return to your normal activities as told by your health care provider. Ask your health care provider what  activities are safe.  Do not scratch or pick at the wound.  Keep all follow-up visits as told by your health care provider. This is important.  Eat a diet that includes protein, vitamin A, vitamin C, and other nutrient-rich foods. These help the wound heal: ? Protein-rich foods include meat, dairy, beans, nuts, and other sources. ? Vitamin A-rich foods include carrots and dark green, leafy vegetables. ? Vitamin C-rich foods include citrus, tomatoes, and other fruits and vegetables. ? Nutrient-rich foods have protein, carbohydrates, fat, vitamins, or minerals. Eat a variety of healthy foods including vegetables, fruits, and whole grains. Contact a health care provider if:  You received a tetanus shot and you have swelling, severe pain, redness, or bleeding at the injection site.  Your pain is not controlled with medicine.  You have more redness, swelling, or pain around the wound.  You have more fluid or blood coming from the wound.  Your wound feels warm to the touch.  You have pus or a bad smell coming from the wound.  You have a fever or chills.  You are nauseous or you vomit.  You are dizzy. Get help right away if:  You have a red streak going away from your wound.  The edges of the wound open up and separate.  Your wound is bleeding and the bleeding does not stop with gentle pressure.  You have a rash.  You faint.  You have trouble breathing. This information  is not intended to replace advice given to you by your health care provider. Make sure you discuss any questions you have with your health care provider. Document Released: 03/20/2008 Document Revised: 02/08/2016 Document Reviewed: 12/27/2015 Elsevier Interactive Patient Education  2017 [place].  Please take Clindamycin 300mg  every 8 hrs for 10 days- this will treat the wound and your bronchitis. Please stop using the antibiotic cream. Keep wound clean and dry as possible. Do not schedule any more back  injection therapy until wound is completely healed. Please call clinic with any questions/concerns. FEEL BETTER! Acute Bronchitis, Adult Acute bronchitis is sudden (acute) swelling of the air tubes (bronchi) in the lungs. Acute bronchitis causes these tubes to fill with mucus, which can make it hard to breathe. It can also cause coughing or wheezing. In adults, acute bronchitis usually goes away within 2 weeks. A cough caused by bronchitis may last up to 3 weeks. Smoking, allergies, and asthma can make the condition worse. Repeated episodes of bronchitis may cause further lung problems, such as chronic obstructive pulmonary disease (COPD). What are the causes? This condition can be caused by germs and by substances that irritate the lungs, including:  Cold and flu viruses. This condition is most often caused by the same virus that causes a cold.  Bacteria.  Exposure to tobacco smoke, dust, fumes, and air pollution.  What increases the risk? This condition is more likely to develop in people who:  Have close contact with someone with acute bronchitis.  Are exposed to lung irritants, such as tobacco smoke, dust, fumes, and vapors.  Have a weak immune system.  Have a respiratory condition such as asthma.  What are the signs or symptoms? Symptoms of this condition include:  A cough.  Coughing up clear, yellow, or green mucus.  Wheezing.  Chest congestion.  Shortness of breath.  A fever.  Body aches.  Chills.  A sore throat.  How is this diagnosed? This condition is usually diagnosed with a physical exam. During the exam, your health care provider may order tests, such as chest X-rays, to rule out other conditions. He or she may also:  Test a sample of your mucus for bacterial infection.  Check the level of oxygen in your blood. This is done to check for pneumonia.  Do a chest X-ray or lung function testing to rule out pneumonia and other conditions.  Perform blood  tests.  Your health care provider will also ask about your symptoms and medical history. How is this treated? Most cases of acute bronchitis clear up over time without treatment. Your health care provider may recommend:  Drinking more fluids. Drinking more makes your mucus thinner, which may make it easier to breathe.  Taking a medicine for a fever or cough.  Taking an antibiotic medicine.  Using an inhaler to help improve shortness of breath and to control a cough.  Using a cool mist vaporizer or humidifier to make it easier to breathe.  Follow these instructions at home: Medicines  Take over-the-counter and prescription medicines only as told by your health care provider.  If you were prescribed an antibiotic, take it as told by your health care provider. Do not stop taking the antibiotic even if you start to feel better. General instructions  Get plenty of rest.  Drink enough fluids to keep your urine clear or pale yellow.  Avoid smoking and secondhand smoke. Exposure to cigarette smoke or irritating chemicals will make bronchitis worse. If you smoke and  you need help quitting, ask your health care provider. Quitting smoking will help your lungs heal faster.  Use an inhaler, cool mist vaporizer, or humidifier as told by your health care provider.  Keep all follow-up visits as told by your health care provider. This is important. How is this prevented? To lower your risk of getting this condition again:  Wash your hands often with soap and water. If soap and water are not available, use hand sanitizer.  Avoid contact with people who have cold symptoms.  Try not to touch your hands to your mouth, nose, or eyes.  Make sure to get the flu shot every year.  Contact a health care provider if:  Your symptoms do not improve in 2 weeks of treatment. Get help right away if:  You cough up blood.  You have chest pain.  You have severe shortness of breath.  You become  dehydrated.  You faint or keep feeling like you are going to faint.  You keep vomiting.  You have a severe headache.  Your fever or chills gets worse. This information is not intended to replace advice given to you by your health care provider. Make sure you discuss any questions you have with your health care provider. Document Released: 07/19/2004 Document Revised: 01/04/2016 Document Reviewed: 11/30/2015 Elsevier Interactive Patient Education  [employer].

## 2018-04-29 NOTE — Assessment & Plan Note (Signed)
Stop tobacco use Please take Clindamycin 300mg  every 8 hrs for 10 days- this will treat the wound and your bronchitis. Please stop using the antibiotic cream. Keep wound clean and dry as possible. Do not schedule any more back injection therapy until wound is completely healed. Please call clinic with any questions/concerns.

## 2018-04-29 NOTE — Assessment & Plan Note (Signed)
Please take Clindamycin 300mg  every 8 hrs for 10 days- this will treat the wound and your bronchitis. Please stop using the antibiotic cream. Keep wound clean and dry as possible. Do not schedule any more back injection therapy until wound is completely healed. Please call clinic with any questions/concerns.

## 2018-06-05 NOTE — Progress Notes (Deleted)
Subjective:    Patient ID: Alejandra Taylor, female    DOB: December 26, 1960, 57 y.o.   MRN: [id number]  HPI : 03/18/18 OV:  Alejandra Taylor is here for regular f/u: T2D, HTN, Obesity, CKD G3a She reports medication compliance, denies SE She does not check BS at home and reports "I haven't felt a low or a high in so long". She is followed by psychiatry [facility]- reports that her mood is stable on current medication regime, denies thoughts of harming herself/others. She denies ETOH or illicit drug use. She denies formal exercise, however remains active with house/yard work and caring for her husband full time.  06/09/18 OV: Alejandra Taylor is here for 3 month f/u: T2D, HTN, Obesity, CKD G3a   Patient Care Team    Relationship Specialty Notifications Start End  Esaw Grandchild, NP PCP - General Family Medicine  04/17/17   Melrose Nakayama, MD Consulting Physician Orthopedic Surgery  04/18/17   Juluis Rainier  Optometry  08/08/17     Patient Active Problem List   Diagnosis Date Noted  . Bronchitis 04/29/2018  . Wound infection 04/16/2018  . Urinary incontinence 12/11/2017  . Constipation 12/11/2017  . Need for hepatitis C screening test 12/11/2017  . Diabetes mellitus without complication ([place]) 64/33/2951  . Healthcare maintenance 06/10/2017  . Colitis 05/24/2017  . Tobacco abuse 05/24/2017  . UTI (urinary tract infection) 05/24/2017  . Depression with anxiety 05/24/2017  . AKI (acute kidney injury) ([place]) 05/24/2017  . Hyponatremia 05/24/2017  . Migraine with status migrainosus, not intractable 04/17/2017  . Other chronic pain 04/17/2017  . Essential hypertension 04/17/2017  . PTSD (post-traumatic stress disorder) 04/17/2017  . Hypothyroidism 04/17/2017  . Hyperlipidemia 04/17/2017  . Endometrial cancer ([place]) 01/22/2012     Past Medical History:  Diagnosis Date  . Chronic back pain   . Colitis   . Diabetes mellitus   . 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 History of degenerative  disc disease   . Hypercholesterolemia   . Hypertension   . Hyperthyroidism   . Migraines   . Post traumatic stress disorder   . Spinal stenosis      Past Surgical History:  Procedure Laterality Date  . ABDOMINAL HYSTERECTOMY    . CHOLECYSTECTOMY    . GANGLION CYST EXCISION     L wrist cyst removal with tendon rupture and repair  . ROBOTIC ASSISTED LAP VAGINAL HYSTERECTOMY  08/19/2009   BSO for endo ca  . TONSILLECTOMY       Family History  Problem Relation Age of Onset  . Hypertension Father   . Diabetes Father   . Heart attack Father   . Pancreatic cancer Father   . Colon cancer Maternal Aunt   . Ovarian cancer Maternal Aunt   . Ovarian cancer Paternal Grandmother   . Stroke Paternal Grandmother   . Diabetes Paternal Grandmother   . Ovarian cancer Cousin   . Breast cancer Cousin      Social History   Substance and Sexual Activity  Drug Use No     Social History   Substance and Sexual Activity  Alcohol Use No     Social History   Tobacco Use  Smoking Status Current Every Day Smoker  . Packs/day: 1.00  . Years: 30.00  . Pack years: 30.00  . Types: Cigarettes  Smokeless Tobacco Never Used     Outpatient Encounter Medications as of 06/09/2018  Medication Sig Note  . albuterol (PROVENTIL HFA;VENTOLIN HFA) 108 (90 BASE)  MCG/ACT inhaler Inhale 2 puffs into the lungs every 6 (six) hours as needed for wheezing or shortness of breath.    . bacitracin 500 UNIT/GM ointment Apply 1 application topically daily.   . benzonatate (TESSALON) 200 MG capsule Take 1 capsule (200 mg total) by mouth 2 (two) times daily as needed for cough.   . clindamycin (CLEOCIN) 300 MG capsule Take 1 capsule (300 mg total) by mouth 3 (three) times daily.   . clonazePAM (KLONOPIN) 1 MG tablet Take 1 mg by mouth 3 (three) times daily. 04/07/2015: Received from: External Pharmacy  . estazolam (PROSOM) 2 MG tablet TAKE 1 TABLET AT BEDTIME AS NEEDED FOR SLEEP MAY REPEAT ONCE AS NEEDED  ONLY IF MID-AWAKENING 04/07/2015: Received from: External Pharmacy  . ezetimibe (ZETIA) 10 MG tablet Take 1 tablet (10 mg total) by mouth daily.   Marland Kitchen glucose blood test strip 1 each by Other route as needed. Use as instructed   . HYDROcodone-acetaminophen (NORCO) 10-325 MG tablet TAKE 1 OR 2 TABLETS BY MOUTH EVERY 6 HOURS AS NEEDED FOR PAIN MUST LAST 30 DAYS 04/07/2015: Received from: External Pharmacy  . levothyroxine (SYNTHROID, LEVOTHROID) 125 MCG tablet Take 1 tablet (125 mcg total) by mouth daily.   Marland Kitchen linaclotide (LINZESS) 72 MCG capsule Take 1 capsule (72 mcg total) by mouth daily before breakfast.   . lisinopril-hydrochlorothiazide (PRINZIDE,ZESTORETIC) 20-12.5 MG tablet Take 1 tablet by mouth 2 (two) times daily.   Marland Kitchen loperamide (IMODIUM) 2 MG capsule Take 2 mg by mouth as needed for diarrhea or loose stools.   . metFORMIN (GLUCOPHAGE) 500 MG tablet Take 1 tablet (500 mg total) by mouth daily with breakfast.   . NARCAN 4 MG/0.1ML LIQD nasal spray kit 1 AS NEEDED FOR OVERDOSE   . pantoprazole (PROTONIX) 40 MG tablet Take 1 tablet (40 mg total) by mouth daily.   . QUEtiapine (SEROQUEL) 300 MG tablet Take 300 mg by mouth at bedtime.   . simvastatin (ZOCOR) 40 MG tablet TAKE 1 TABLET BY MOUTH EVERY DAY IN THE EVENING   . tiZANidine (ZANAFLEX) 2 MG capsule TAKE 1 CAP AS NEEDED ONCE A DAY FOR MUSCLE SPASM   . topiramate (TOPAMAX) 100 MG tablet Take 1 tablet (100 mg total) by mouth at bedtime. Max dose is 126m at bedtime   . venlafaxine XR (EFFEXOR-XR) 150 MG 24 hr capsule Take 1 capsule every morning and 1 capsule at night    No facility-administered encounter medications on file as of 06/09/2018.     Allergies: Other and Penicillins  There is no height or weight on file to calculate BMI.  There were no vitals taken for this visit.  Review of Systems  Constitutional: Positive for fatigue. Negative for activity change, appetite change, chills, diaphoresis, fever and unexpected weight  change.  Eyes: Negative for visual disturbance.  Respiratory: Negative for cough, chest tightness, shortness of breath, wheezing and stridor.   Cardiovascular: Negative for chest pain, palpitations and leg swelling.  Gastrointestinal: Positive for constipation. Negative for abdominal distention, abdominal pain, blood in stool, diarrhea, nausea and vomiting.  Genitourinary: Positive for difficulty urinating. Negative for flank pain, frequency and urgency.  Musculoskeletal: Positive for arthralgias, back pain, gait problem, joint swelling, myalgias, neck pain and neck stiffness.  Skin: Positive for color change. Negative for pallor, rash and wound.  Neurological: Negative for dizziness and headaches.  Hematological: Does not bruise/bleed easily.  Psychiatric/Behavioral: Positive for dysphoric mood. Negative for behavioral problems, decreased concentration, hallucinations, self-injury, sleep disturbance and suicidal ideas.  The patient is nervous/anxious. The patient is not hyperactive.        Objective:   Physical Exam Vitals signs and nursing note reviewed.  Constitutional:      General: She is not in acute distress.    Appearance: She is well-developed. She is not diaphoretic.  HENT:     Head: Normocephalic and atraumatic.     Right Ear: External ear normal. No decreased hearing noted.     Left Ear: External ear normal. No decreased hearing noted.     Nose: No mucosal edema.     Right Sinus: No maxillary sinus tenderness or frontal sinus tenderness.     Left Sinus: No maxillary sinus tenderness or frontal sinus tenderness.     Mouth/Throat:     Dentition: Abnormal dentition. Dental caries present.     Pharynx: No oropharyngeal exudate or posterior oropharyngeal erythema.     Tonsils: No tonsillar abscesses.  Eyes:     Conjunctiva/sclera: Conjunctivae normal.     Pupils: Pupils are equal, round, and reactive to light.  Cardiovascular:     Rate and Rhythm: Normal rate and regular  rhythm.     Heart sounds: Normal heart sounds. No murmur.  Pulmonary:     Effort: Pulmonary effort is normal. No respiratory distress.     Breath sounds: Normal breath sounds. No stridor. No wheezing.  Chest:     Chest wall: No tenderness.  Abdominal:     Comments: Pendulous abdomen  Musculoskeletal: Normal range of motion.  Skin:    General: Skin is warm and dry.     Capillary Refill: Capillary refill takes less than 2 seconds.     Coloration: Skin is not pale.     Findings: No erythema or rash.  Neurological:     Mental Status: She is alert and oriented to person, place, and time.  Psychiatric:        Behavior: Behavior normal.        Thought Content: Thought content normal.        Judgment: Judgment normal.       Assessment & Plan:   No diagnosis found.  No problem-specific Assessment & Plan notes found for this encounter.    FOLLOW-UP:  No follow-ups on file.

## 2018-06-06 ENCOUNTER — Other Ambulatory Visit: Payer: Self-pay | Admitting: Adult Health

## 2018-06-09 ENCOUNTER — Ambulatory Visit: Payer: Medicare Other | Admitting: Adult Health

## 2018-06-09 ENCOUNTER — Other Ambulatory Visit: Payer: Self-pay | Admitting: Adult Health

## 2018-06-10 ENCOUNTER — Telehealth: Payer: Self-pay | Admitting: Adult Health

## 2018-06-10 NOTE — Telephone Encounter (Signed)
-----   Message from Fonnie Mu, [place] sent at 06/09/2018  1:19 PM EST ----- Please call pt to r/s her appt that she cancelled for today.  NO further refills on medications until pt is seen in office.  Thanks!

## 2018-06-10 NOTE — Telephone Encounter (Signed)
Called pt to rescheduled missed/ cancelled appt--left message to call office-.  --Forwarding message to medical assistant as Juluis Rainier .  --glh

## 2018-06-19 NOTE — Progress Notes (Signed)
Subjective:    Patient ID: Alejandra Taylor, female    DOB: [birth year], 57 y.o.   MRN: [id number]  HPI:  Alejandra Taylor presents with non-productive cough, wheezing, fatigue, nasal drainage, and chills that have been worsening over the last 2 weeks. She states "I feel like I have a fever, but I haven't taken my temp" She denies N/V/D/abdominal pain She denies urinary frequency/dysuria She denies CP/dyspnea at rest/palpitations She reports not using any tobacco or vape products since 05/27/18 She reports using vape products laced with THC after stopping cigarettes and coming up + on UDS at pain clinic a few months ago. She has been off narcotics due to violation of controlled substance policy She has been using OTC NyQuil and DayQuil and cough gtt's She has not been using albuterol at home?  Patient Care Team    Relationship Specialty Notifications Start End  Mina Marble D, NP PCP - General Family Medicine  04/17/17   Melrose Nakayama, MD Consulting Physician Orthopedic Surgery  04/18/17   Juluis Rainier  Optometry  08/08/17     Patient Active Problem List   Diagnosis Date Noted  . Bronchitis 04/29/2018  . Wound infection 04/16/2018  . Urinary incontinence 12/11/2017  . Constipation 12/11/2017  . Need for hepatitis C screening test 12/11/2017  . Diabetes mellitus without complication ([facility]) 89/21/1941  . Healthcare maintenance 06/10/2017  . Colitis 05/24/2017  . Tobacco abuse 05/24/2017  . UTI (urinary tract infection) 05/24/2017  . Depression with anxiety 05/24/2017  . AKI (acute kidney injury) ([place]) 05/24/2017  . Hyponatremia 05/24/2017  . Migraine with status migrainosus, not intractable 04/17/2017  . Other chronic pain 04/17/2017  . Essential hypertension 04/17/2017  . PTSD (post-traumatic stress disorder) 04/17/2017  . Hypothyroidism 04/17/2017  . Hyperlipidemia 04/17/2017  . Endometrial cancer ([place]) 01/22/2012     Past Medical History:  Diagnosis Date  . Chronic  back pain   . Colitis   . Diabetes mellitus   . 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 History of degenerative disc disease   . Hypercholesterolemia   . Hypertension   . Hyperthyroidism   . Migraines   . Post traumatic stress disorder   . Spinal stenosis      Past Surgical History:  Procedure Laterality Date  . ABDOMINAL HYSTERECTOMY    . CHOLECYSTECTOMY    . GANGLION CYST EXCISION     L wrist cyst removal with tendon rupture and repair  . ROBOTIC ASSISTED LAP VAGINAL HYSTERECTOMY  08/19/2009   BSO for endo ca  . TONSILLECTOMY       Family History  Problem Relation Age of Onset  . Hypertension Father   . Diabetes Father   . Heart attack Father   . Pancreatic cancer Father   . Colon cancer Maternal Aunt   . Ovarian cancer Maternal Aunt   . Ovarian cancer Paternal Grandmother   . Stroke Paternal Grandmother   . Diabetes Paternal Grandmother   . Ovarian cancer Cousin   . Breast cancer Cousin      Social History   Substance and Sexual Activity  Drug Use No     Social History   Substance and Sexual Activity  Alcohol Use No     Social History   Tobacco Use  Smoking Status Current Every Day Smoker  . Packs/day: 1.00  . Years: 30.00  . Pack years: 30.00  . Types: Cigarettes  Smokeless Tobacco Never Used     Outpatient Encounter Medications as of 06/20/2018  Medication Sig Note  . albuterol (PROVENTIL HFA;VENTOLIN HFA) 108 (90 Base) MCG/ACT inhaler Inhale 2 puffs into the lungs every 6 (six) hours as needed for wheezing or shortness of breath.   . bacitracin 500 UNIT/GM ointment Apply 1 application topically daily.   . benzonatate (TESSALON) 200 MG capsule Take 1 capsule (200 mg total) by mouth 2 (two) times daily as needed for cough.   . clindamycin (CLEOCIN) 300 MG capsule Take 1 capsule (300 mg total) by mouth 3 (three) times daily.   . clonazePAM (KLONOPIN) 1 MG tablet Take 1 mg by mouth 3 (three) times daily. 04/07/2015: Received from: External  Pharmacy  . estazolam (PROSOM) 2 MG tablet TAKE 1 TABLET AT BEDTIME AS NEEDED FOR SLEEP MAY REPEAT ONCE AS NEEDED ONLY IF MID-AWAKENING 04/07/2015: Received from: External Pharmacy  . ezetimibe (ZETIA) 10 MG tablet Take 1 tablet (10 mg total) by mouth daily.   Marland Kitchen glucose blood test strip 1 each by Other route as needed. Use as instructed   . HYDROcodone-acetaminophen (NORCO) 10-325 MG tablet TAKE 1 OR 2 TABLETS BY MOUTH EVERY 6 HOURS AS NEEDED FOR PAIN MUST LAST 30 DAYS 04/07/2015: Received from: External Pharmacy  . levothyroxine (SYNTHROID, LEVOTHROID) 125 MCG tablet Take 1 tablet (125 mcg total) by mouth daily.   Marland Kitchen linaclotide (LINZESS) 72 MCG capsule Take 1 capsule (72 mcg total) by mouth daily before breakfast.   . lisinopril-hydrochlorothiazide (PRINZIDE,ZESTORETIC) 20-12.5 MG tablet TAKE 1 TABLET BY MOUTH TWICE A DAY   . loperamide (IMODIUM) 2 MG capsule Take 2 mg by mouth as needed for diarrhea or loose stools.   . metFORMIN (GLUCOPHAGE) 500 MG tablet Take 1 tablet (500 mg total) by mouth daily with breakfast.   . NARCAN 4 MG/0.1ML LIQD nasal spray kit 1 AS NEEDED FOR OVERDOSE   . pantoprazole (PROTONIX) 40 MG tablet Take 1 tablet (40 mg total) by mouth daily. PATIENT MUST HAVE OFFICE VISIT PRIOR TO ANY FURTHER REFILLS   . QUEtiapine (SEROQUEL) 300 MG tablet Take 300 mg by mouth at bedtime.   . simvastatin (ZOCOR) 40 MG tablet TAKE 1 TABLET BY MOUTH EVERY DAY IN THE EVENING   . tiZANidine (ZANAFLEX) 2 MG capsule TAKE 1 CAP AS NEEDED ONCE A DAY FOR MUSCLE SPASM   . topiramate (TOPAMAX) 100 MG tablet Take 1 tablet (100 mg total) by mouth at bedtime. Max dose is 198m at bedtime   . venlafaxine XR (EFFEXOR-XR) 150 MG 24 hr capsule Take 1 capsule every morning and 1 capsule at night   . [DISCONTINUED] albuterol (PROVENTIL HFA;VENTOLIN HFA) 108 (90 BASE) MCG/ACT inhaler Inhale 2 puffs into the lungs every 6 (six) hours as needed for wheezing or shortness of breath.    . [DISCONTINUED] benzonatate  (TESSALON) 200 MG capsule Take 1 capsule (200 mg total) by mouth 2 (two) times daily as needed for cough.   . [DISCONTINUED] levothyroxine (SYNTHROID, LEVOTHROID) 125 MCG tablet Take 1 tablet (125 mcg total) by mouth daily.   . [DISCONTINUED] linaclotide (LINZESS) 72 MCG capsule Take 1 capsule (72 mcg total) by mouth daily before breakfast.   . [DISCONTINUED] pantoprazole (PROTONIX) 40 MG tablet Take 1 tablet (40 mg total) by mouth daily. PATIENT MUST HAVE OFFICE VISIT PRIOR TO ANY FURTHER REFILLS   . azithromycin (ZITHROMAX) 250 MG tablet 2 tabs day one.  1 tab days two-five   . predniSONE (DELTASONE) 10 MG tablet Take 1 tablet (10 mg total) by mouth daily with breakfast.    Facility-Administered Encounter  Medications as of 06/20/2018  Medication  . ipratropium-albuterol (DUONEB) 0.5-2.5 (3) MG/3ML nebulizer solution 3 mL    Allergies: Other and Penicillins  Body mass index is 30.43 kg/m.  Blood pressure 97/62, pulse 88, temperature 98.2 F (36.8 C), temperature source Oral, height _0  (1.702 m), weight 194 lb 4.8 oz (88.1 kg), SpO2 100 %.  Review of Systems  Constitutional: Positive for activity change, chills, fatigue and fever. Negative for appetite change, diaphoresis and unexpected weight change.  HENT: Positive for congestion, postnasal drip, rhinorrhea, sinus pressure, sinus pain, sneezing, sore throat and voice change. Negative for ear pain and facial swelling.   Eyes: Negative for visual disturbance.  Respiratory: Positive for cough, chest tightness, shortness of breath and wheezing.   Cardiovascular: Negative for chest pain, palpitations and leg swelling.  Gastrointestinal: Negative for abdominal distention, abdominal pain, blood in stool, constipation, diarrhea, nausea and vomiting.  Genitourinary: Negative for difficulty urinating, dysuria and flank pain.  Neurological: Negative for dizziness and headaches.  Psychiatric/Behavioral: Positive for sleep disturbance.        Objective:   Physical Exam Vitals signs and nursing note reviewed.  Constitutional:      Appearance: She is ill-appearing. She is not toxic-appearing or diaphoretic.  HENT:     Head: Normocephalic and atraumatic.     Right Ear: Tympanic membrane is bulging. Tympanic membrane is not erythematous.     Left Ear: Tympanic membrane is bulging. Tympanic membrane is not erythematous.     Nose:     Right Sinus: Frontal sinus tenderness present. No maxillary sinus tenderness.     Left Sinus: Frontal sinus tenderness present. No maxillary sinus tenderness.     Mouth/Throat:     Dentition: Abnormal dentition. Dental caries present.     Pharynx: Posterior oropharyngeal erythema present. No oropharyngeal exudate.     Tonsils: No tonsillar exudate or tonsillar abscesses. Swelling: 1+ on the right. 0 on the left.  Eyes:     Extraocular Movements: Extraocular movements intact.     Conjunctiva/sclera: Conjunctivae normal.     Pupils: Pupils are equal, round, and reactive to light.  Cardiovascular:     Rate and Rhythm: Normal rate.     Pulses: Normal pulses.     Heart sounds: Normal heart sounds. No murmur. No friction rub. No gallop.   Pulmonary:     Effort: Pulmonary effort is normal.     Breath sounds: Examination of the right-upper field reveals wheezing. Examination of the left-upper field reveals wheezing. Examination of the right-middle field reveals wheezing. Examination of the left-middle field reveals wheezing. Wheezing present. No decreased breath sounds, rhonchi or rales.     Comments: 75% reduction in wheezing with in-clinic breathing tx Skin:    Capillary Refill: Capillary refill takes less than 2 seconds.  Neurological:     Mental Status: She is alert and oriented to person, place, and time.  Psychiatric:        Mood and Affect: Mood normal.        Behavior: Behavior normal.        Thought Content: Thought content normal.        Judgment: Judgment normal.       Assessment &  Plan:   1. Bronchitis     Bronchitis Please take Azithromycin and low dose Prednisone as directed (small increase in A1c-6.7, so only 65m prednisone with breakfast for 5 days). Please use Albuterol and Tessalon as needed. Increase fluids/rest/vit c-2,0080mday Abstain from all tobacco/vape products. If you  are still coughing at follow-up in 07/01/17- we may obtain a chest xray.    FOLLOW-UP:  Return if symptoms worsen or fail to improve.

## 2018-06-20 ENCOUNTER — Ambulatory Visit (INDEPENDENT_AMBULATORY_CARE_PROVIDER_SITE_OTHER): Payer: BLUE CROSS/BLUE SHIELD | Admitting: Adult Health

## 2018-06-20 ENCOUNTER — Encounter: Payer: Self-pay | Admitting: Adult Health

## 2018-06-20 DIAGNOSIS — J40 Bronchitis, not specified as acute or chronic: Secondary | ICD-10-CM

## 2018-06-20 MED ORDER — LEVOTHYROXINE SODIUM 125 MCG PO TABS: 125.0000 ug | ORAL_TABLET | Freq: Every day | ORAL | 1 refills | Status: DC

## 2018-06-20 MED ORDER — IPRATROPIUM-ALBUTEROL 0.5-2.5 (3) MG/3ML IN SOLN
3.0000 mL | Freq: Four times a day (QID) | RESPIRATORY_TRACT | Status: AC
  Administered 2018-06-20: 3 mL via RESPIRATORY_TRACT

## 2018-06-20 MED ORDER — PANTOPRAZOLE SODIUM 40 MG PO TBEC: 40.0000 mg | DELAYED_RELEASE_TABLET | Freq: Every day | ORAL | 0 refills | Status: DC

## 2018-06-20 MED ORDER — PREDNISONE 10 MG PO TABS: 10.0000 mg | ORAL_TABLET | Freq: Every day | ORAL | 0 refills | Status: DC

## 2018-06-20 MED ORDER — LINACLOTIDE 72 MCG PO CAPS: 72.0000 ug | ORAL_CAPSULE | Freq: Every day | ORAL | 1 refills | Status: DC

## 2018-06-20 MED ORDER — BENZONATATE 200 MG PO CAPS: 200.0000 mg | ORAL_CAPSULE | Freq: Two times a day (BID) | ORAL | 0 refills | Status: DC | PRN

## 2018-06-20 MED ORDER — ALBUTEROL SULFATE HFA 108 (90 BASE) MCG/ACT IN AERS: 2.0000 | INHALATION_SPRAY | Freq: Four times a day (QID) | RESPIRATORY_TRACT | 3 refills | Status: AC | PRN

## 2018-06-20 MED ORDER — AZITHROMYCIN 250 MG PO TABS: ORAL_TABLET | ORAL | 0 refills | Status: DC

## 2018-06-20 NOTE — Patient Instructions (Addendum)
Acute Bronchitis, Adult  Acute bronchitis is sudden (acute) swelling of the air tubes (bronchi) in the lungs. Acute bronchitis causes these tubes to fill with mucus, which can make it hard to breathe. It can also cause coughing or wheezing. In adults, acute bronchitis usually goes away within 2 weeks. A cough caused by bronchitis may last up to 3 weeks. Smoking, allergies, and asthma can make the condition worse. Repeated episodes of bronchitis may cause further lung problems, such as chronic obstructive pulmonary disease (COPD). What are the causes? This condition can be caused by germs and by substances that irritate the lungs, including:  Cold and flu viruses. This condition is most often caused by the same virus that causes a cold.  Bacteria.  Exposure to tobacco smoke, dust, fumes, and air pollution. What increases the risk? This condition is more likely to develop in people who:  Have close contact with someone with acute bronchitis.  Are exposed to lung irritants, such as tobacco smoke, dust, fumes, and vapors.  Have a weak immune system.  Have a respiratory condition such as asthma. What are the signs or symptoms? Symptoms of this condition include:  A cough.  Coughing up clear, yellow, or green mucus.  Wheezing.  Chest congestion.  Shortness of breath.  A fever.  Body aches.  Chills.  A sore throat. How is this diagnosed? This condition is usually diagnosed with a physical exam. During the exam, your health care provider may order tests, such as chest X-rays, to rule out other conditions. He or she may also:  Test a sample of your mucus for bacterial infection.  Check the level of oxygen in your blood. This is done to check for pneumonia.  Do a chest X-ray or lung function testing to rule out pneumonia and other conditions.  Perform blood tests. Your health care provider will also ask about your symptoms and medical history. How is this treated? Most  cases of acute bronchitis clear up over time without treatment. Your health care provider may recommend:  Drinking more fluids. Drinking more makes your mucus thinner, which may make it easier to breathe.  Taking a medicine for a fever or cough.  Taking an antibiotic medicine.  Using an inhaler to help improve shortness of breath and to control a cough.  Using a cool mist vaporizer or humidifier to make it easier to breathe. Follow these instructions at home: Medicines  Take over-the-counter and prescription medicines only as told by your health care provider.  If you were prescribed an antibiotic, take it as told by your health care provider. Do not stop taking the antibiotic even if you start to feel better. General instructions   Get plenty of rest.  Drink enough fluids to keep your urine pale yellow.  Avoid smoking and secondhand smoke. Exposure to cigarette smoke or irritating chemicals will make bronchitis worse. If you smoke and you need help quitting, ask your health care provider. Quitting smoking will help your lungs heal faster.  Use an inhaler, cool mist vaporizer, or humidifier as told by your health care provider.  Keep all follow-up visits as told by your health care provider. This is important. How is this prevented? To lower your risk of getting this condition again:  Wash your hands often with soap and water. If soap and water are not available, use hand sanitizer.  Avoid contact with people who have cold symptoms.  Try not to touch your hands to your mouth, nose, or eyes.    Make sure to get the flu shot every year. Contact a health care provider if:  Your symptoms do not improve in 2 weeks of treatment. Get help right away if:  You cough up blood.  You have chest pain.  You have severe shortness of breath.  You become dehydrated.  You faint or keep feeling like you are going to faint.  You keep vomiting.  You have a severe headache.  Your  fever or chills gets worse. This information is not intended to replace advice given to you by your health care provider. Make sure you discuss any questions you have with your health care provider. Document Released: 07/19/2004 Document Revised: 01/23/2017 Document Reviewed: 11/30/2015 Elsevier Interactive Patient Education  2019 [place].  Please take Azithromycin and low dose Prednisone as directed. Please use Albuterol and Tessalon as needed. Increase fluids/rest/vit c-2,000mg /day Abstain from all tobacco/vape products. If you are still coughing at follow-up in 07/01/17- we may obtain a chest xray. FEEL BETTER!

## 2018-06-20 NOTE — Assessment & Plan Note (Addendum)
Please take Azithromycin and low dose Prednisone as directed (small increase in A1c-6.7, so only 10mg  prednisone with breakfast for 5 days). Please use Albuterol and Tessalon as needed. Increase fluids/rest/vit c-2,000mg /day Abstain from all tobacco/vape products. If you are still coughing at follow-up in 07/01/17- we may obtain a chest xray.

## 2018-06-26 NOTE — Progress Notes (Signed)
Subjective:    Patient ID: Alejandra Taylor, female    DOB: [birth year], 58 y.o.   MRN: [id number]  HPI : 03/17/18 OV:  Alejandra Taylor is here for regular f/u: T2D, HTN, Obesity, CKD G3a She reports medication compliance, denies SE She does not check BS at home and reports "I haven't felt a low or a high in so long". She is followed by psychiatry Q6M- reports that her mood is stable on current medication regime, denies thoughts of harming herself/others. She denies ETOH or illicit drug use. She denies formal exercise, however remains active with house/yard work and caring for her husband full time.  07/01/18 OV: Alejandra Taylor is here for 3 month f/u: HTN, T2D, Chronic Pain Home BP 90-110s  She denies dizziness/CP/HA She reports being on Lisinopril/HCTZ 20/12.5 BID >10 years Her original PCP started her on this due to heavy tobacco use (>1.5pack/day) and morbid obesity (>350 lbs) She stopped tobacco use >6 weeks ago and current wt 193 BP today 91/49 AM BG 110-120s, denies episodes of hypoglycemia She "pretty much follows a non-sugary diet" Due to chronic pain, is unable to exercise, however remains active with house/yardwork Followed by Pain Management monthly Nephrology 07/09/2018  Patient Care Team    Relationship Specialty Notifications Start End  Mina Marble D, NP PCP - General Family Medicine  04/17/17   Melrose Nakayama, MD Consulting Physician Orthopedic Surgery  04/18/17   Juluis Rainier  Optometry  08/08/17     Patient Active Problem List   Diagnosis Date Noted  . Urinary incontinence 12/11/2017  . Constipation 12/11/2017  . Need for hepatitis C screening test 12/11/2017  . Diabetes mellitus without complication ([place]) 67/59/1638  . Healthcare maintenance 06/10/2017  . Colitis 05/24/2017  . Tobacco abuse 05/24/2017  . UTI (urinary tract infection) 05/24/2017  . Depression with anxiety 05/24/2017  . AKI (acute kidney injury) ([place]) 05/24/2017  . Hyponatremia 05/24/2017   . Migraine with status migrainosus, not intractable 04/17/2017  . Other chronic pain 04/17/2017  . Essential hypertension 04/17/2017  . PTSD (post-traumatic stress disorder) 04/17/2017  . Hypothyroidism 04/17/2017  . Hyperlipidemia 04/17/2017  . Endometrial cancer ([place]) 01/22/2012     Past Medical History:  Diagnosis Date  . Chronic back pain   . Colitis   . Diabetes mellitus   . 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 History of degenerative disc disease   . Hypercholesterolemia   . Hypertension   . Hyperthyroidism   . Migraines   . Post traumatic stress disorder   . Spinal stenosis      Past Surgical History:  Procedure Laterality Date  . ABDOMINAL HYSTERECTOMY    . CHOLECYSTECTOMY    . GANGLION CYST EXCISION     L wrist cyst removal with tendon rupture and repair  . ROBOTIC ASSISTED LAP VAGINAL HYSTERECTOMY  08/19/2009   BSO for endo ca  . TONSILLECTOMY       Family History  Problem Relation Age of Onset  . Hypertension Father   . Diabetes Father   . Heart attack Father   . Pancreatic cancer Father   . Colon cancer Maternal Aunt   . Ovarian cancer Maternal Aunt   . Ovarian cancer Paternal Grandmother   . Stroke Paternal Grandmother   . Diabetes Paternal Grandmother   . Ovarian cancer Cousin   . Breast cancer Cousin      Social History   Substance and Sexual Activity  Drug Use No     Social History  Substance and Sexual Activity  Alcohol Use No     Social History   Tobacco Use  Smoking Status Current Every Day Smoker  . Packs/day: 1.00  . Years: 30.00  . Pack years: 30.00  . Types: Cigarettes  Smokeless Tobacco Never Used     Outpatient Encounter Medications as of 07/01/2018  Medication Sig Note  . albuterol (PROVENTIL HFA;VENTOLIN HFA) 108 (90 Base) MCG/ACT inhaler Inhale 2 puffs into the lungs every 6 (six) hours as needed for wheezing or shortness of breath.   . bacitracin 500 UNIT/GM ointment Apply 1 application topically  daily.   . clindamycin (CLEOCIN) 300 MG capsule Take 1 capsule (300 mg total) by mouth 3 (three) times daily.   . clonazePAM (KLONOPIN) 1 MG tablet Take 1 mg by mouth 3 (three) times daily. 04/07/2015: Received from: External Pharmacy  . estazolam (PROSOM) 2 MG tablet TAKE 1 TABLET AT BEDTIME AS NEEDED FOR SLEEP MAY REPEAT ONCE AS NEEDED ONLY IF MID-AWAKENING 04/07/2015: Received from: External Pharmacy  . ezetimibe (ZETIA) 10 MG tablet Take 1 tablet (10 mg total) by mouth daily.   Marland Kitchen glucose blood test strip 1 each by Other route as needed. Use as instructed   . HYDROcodone-acetaminophen (NORCO) 10-325 MG tablet TAKE 1 OR 2 TABLETS BY MOUTH EVERY 6 HOURS AS NEEDED FOR PAIN MUST LAST 30 DAYS 04/07/2015: Received from: External Pharmacy  . levothyroxine (SYNTHROID, LEVOTHROID) 125 MCG tablet Take 1 tablet (125 mcg total) by mouth daily.   Marland Kitchen linaclotide (LINZESS) 72 MCG capsule Take 1 capsule (72 mcg total) by mouth daily before breakfast.   . lisinopril-hydrochlorothiazide (PRINZIDE,ZESTORETIC) 20-12.5 MG tablet Take 1 tablet by mouth daily.   . metFORMIN (GLUCOPHAGE) 500 MG tablet Take 1 tablet (500 mg total) by mouth daily with breakfast.   . NARCAN 4 MG/0.1ML LIQD nasal spray kit 1 AS NEEDED FOR OVERDOSE   . pantoprazole (PROTONIX) 40 MG tablet Take 1 tablet (40 mg total) by mouth daily. PATIENT MUST HAVE OFFICE VISIT PRIOR TO ANY FURTHER REFILLS   . predniSONE (DELTASONE) 10 MG tablet Take 1 tablet (10 mg total) by mouth daily with breakfast.   . QUEtiapine (SEROQUEL) 300 MG tablet Take 300 mg by mouth at bedtime.   . simvastatin (ZOCOR) 40 MG tablet TAKE 1 TABLET BY MOUTH EVERY DAY IN THE EVENING   . tiZANidine (ZANAFLEX) 2 MG capsule TAKE 1 CAP AS NEEDED ONCE A DAY FOR MUSCLE SPASM   . topiramate (TOPAMAX) 100 MG tablet Take 1 tablet (100 mg total) by mouth at bedtime. Max dose is 166m at bedtime   . venlafaxine XR (EFFEXOR-XR) 150 MG 24 hr capsule Take 1 capsule every morning and 1 capsule at  night   . [DISCONTINUED] lisinopril-hydrochlorothiazide (PRINZIDE,ZESTORETIC) 20-12.5 MG tablet TAKE 1 TABLET BY MOUTH TWICE A DAY   . [DISCONTINUED] azithromycin (ZITHROMAX) 250 MG tablet 2 tabs day one.  1 tab days two-five   . [DISCONTINUED] benzonatate (TESSALON) 200 MG capsule Take 1 capsule (200 mg total) by mouth 2 (two) times daily as needed for cough.   . [DISCONTINUED] loperamide (IMODIUM) 2 MG capsule Take 2 mg by mouth as needed for diarrhea or loose stools.    Facility-Administered Encounter Medications as of 07/01/2018  Medication  . ipratropium-albuterol (DUONEB) 0.5-2.5 (3) MG/3ML nebulizer solution 3 mL    Allergies: Other and Penicillins  Body mass index is 30.35 kg/m.  Blood pressure (!) 91/49, pulse 88, temperature 98.2 F (36.8 C), temperature source Oral, height 5'  7" (1.702 m), weight 193 lb 12.8 oz (87.9 kg), SpO2 99 %.  Review of Systems  Constitutional: Positive for fatigue. Negative for activity change, appetite change, chills, diaphoresis, fever and unexpected weight change.  Eyes: Negative for visual disturbance.  Respiratory: Negative for cough, chest tightness, shortness of breath, wheezing and stridor.   Cardiovascular: Negative for chest pain, palpitations and leg swelling.  Gastrointestinal: Positive for constipation. Negative for abdominal distention, abdominal pain, blood in stool, diarrhea, nausea and vomiting.  Genitourinary: Positive for difficulty urinating. Negative for flank pain, frequency and urgency.  Musculoskeletal: Positive for arthralgias, back pain, gait problem, joint swelling, myalgias, neck pain and neck stiffness.  Skin: Positive for color change. Negative for pallor, rash and wound.  Neurological: Negative for dizziness and headaches.  Hematological: Does not bruise/bleed easily.  Psychiatric/Behavioral: Positive for dysphoric mood. Negative for behavioral problems, decreased concentration, hallucinations, self-injury, sleep  disturbance and suicidal ideas. The patient is nervous/anxious. The patient is not hyperactive.        Objective:   Physical Exam Vitals signs and nursing note reviewed.  Constitutional:      General: She is not in acute distress.    Appearance: She is well-developed. She is not diaphoretic.  HENT:     Head: Normocephalic and atraumatic.     Right Ear: External ear normal. No decreased hearing noted.     Left Ear: External ear normal. No decreased hearing noted.     Nose: No mucosal edema.     Right Sinus: No maxillary sinus tenderness or frontal sinus tenderness.     Left Sinus: No maxillary sinus tenderness or frontal sinus tenderness.     Mouth/Throat:     Dentition: Abnormal dentition. Dental caries present.     Pharynx: No oropharyngeal exudate or posterior oropharyngeal erythema.     Tonsils: No tonsillar abscesses.  Eyes:     Conjunctiva/sclera: Conjunctivae normal.     Pupils: Pupils are equal, round, and reactive to light.  Cardiovascular:     Rate and Rhythm: Normal rate and regular rhythm.     Heart sounds: Normal heart sounds. No murmur.  Pulmonary:     Effort: Pulmonary effort is normal. No respiratory distress.     Breath sounds: Normal breath sounds. No stridor. No wheezing.  Chest:     Chest wall: No tenderness.  Abdominal:     Comments: Pendulous abdomen  Musculoskeletal: Normal range of motion.  Skin:    General: Skin is warm and dry.     Capillary Refill: Capillary refill takes less than 2 seconds.     Coloration: Skin is not pale.     Findings: No erythema or rash.  Neurological:     Mental Status: She is alert and oriented to person, place, and time.  Psychiatric:        Behavior: Behavior normal.        Thought Content: Thought content normal.        Judgment: Judgment normal.       Assessment & Plan:   1. Need for influenza vaccination   2. Need for pneumococcal vaccination   3. Diabetes mellitus without complication ([place])   4. Essential  hypertension   5. Tobacco abuse     Essential hypertension Last two BPs have been low 91/49 She denies CP/dizziness/palpitations She reports home readings SBP 90-110s DBP 40-70s She has been on Lisinopril/HCTZ 20/12.5 BID >10 years Her original PCP started her on it BID- She weighed >350 lbs and smoked >1.5  packs/days Current wt 193, stopped tobacco use >6 weeks ago Instructed to take Lisinopril/HCTZ QD stop BID, continue to check BP/HR daily   Diabetes mellitus without complication ([facility]) Lab Results  Component Value Date   HGBA1C 6.8 (A) 07/01/2018   HGBA1C 6.7 (A) 03/17/2018   HGBA1C 7.0 (A) 12/11/2017  Home AM B 110-120s Denies episodes of hypoglycemia Continue Metformin 577m BID  Tobacco abuse Been off tobacco >6 weeks- GREAT!    FOLLOW-UP:  Return in about 3 months (around 09/30/2018).

## 2018-07-01 ENCOUNTER — Encounter: Payer: Self-pay | Admitting: Adult Health

## 2018-07-01 ENCOUNTER — Ambulatory Visit (INDEPENDENT_AMBULATORY_CARE_PROVIDER_SITE_OTHER): Payer: BLUE CROSS/BLUE SHIELD | Admitting: Adult Health

## 2018-07-01 VITALS — BP 91/49 | HR 88 | Temp 98.2°F | Ht 67.0 in | Wt 193.8 lb

## 2018-07-01 DIAGNOSIS — I10 Essential (primary) hypertension: Secondary | ICD-10-CM | POA: Diagnosis not present

## 2018-07-01 DIAGNOSIS — Z23 Encounter for immunization: Secondary | ICD-10-CM

## 2018-07-01 DIAGNOSIS — Z720 Tobacco use: Secondary | ICD-10-CM

## 2018-07-01 DIAGNOSIS — E119 Type 2 diabetes mellitus without complications: Secondary | ICD-10-CM | POA: Diagnosis not present

## 2018-07-01 LAB — POCT GLYCOSYLATED HEMOGLOBIN (HGB A1C): Hemoglobin A1C: 6.8 % — AB (ref 4.0–5.6)

## 2018-07-01 MED ORDER — LISINOPRIL-HYDROCHLOROTHIAZIDE 20-12.5 MG PO TABS: 1.0000 | ORAL_TABLET | Freq: Every day | ORAL | 0 refills | Status: DC

## 2018-07-01 NOTE — Patient Instructions (Addendum)
Diabetes Mellitus and Nutrition, Adult When you have diabetes (diabetes mellitus), it is very important to have healthy eating habits because your blood sugar (glucose) levels are greatly affected by what you eat and drink. Eating healthy foods in the appropriate amounts, at about the same times every day, can help you:  Control your blood glucose.  Lower your risk of heart disease.  Improve your blood pressure.  Reach or maintain a healthy weight. Every person with diabetes is different, and each person has different needs for a meal plan. Your health care provider may recommend that you work with a diet and nutrition specialist (dietitian) to make a meal plan that is best for you. Your meal plan may vary depending on factors such as:  The calories you need.  The medicines you take.  Your weight.  Your blood glucose, blood pressure, and cholesterol levels.  Your activity level.  Other health conditions you have, such as heart or kidney disease. How do carbohydrates affect me? Carbohydrates, also called carbs, affect your blood glucose level more than any other type of food. Eating carbs naturally raises the amount of glucose in your blood. Carb counting is a method for keeping track of how many carbs you eat. Counting carbs is important to keep your blood glucose at a healthy level, especially if you use insulin or take certain oral diabetes medicines. It is important to know how many carbs you can safely have in each meal. This is different for every person. Your dietitian can help you calculate how many carbs you should have at each meal and for each snack. Foods that contain carbs include:  Bread, cereal, rice, pasta, and crackers.  Potatoes and corn.  Peas, beans, and lentils.  Milk and yogurt.  Fruit and juice.  Desserts, such as cakes, cookies, ice cream, and candy. How does alcohol affect me? Alcohol can cause a sudden decrease in blood glucose (hypoglycemia),  especially if you use insulin or take certain oral diabetes medicines. Hypoglycemia can be a life-threatening condition. Symptoms of hypoglycemia (sleepiness, dizziness, and confusion) are similar to symptoms of having too much alcohol. If your health care provider says that alcohol is safe for you, follow these guidelines:  Limit alcohol intake to no more than 1 drink per day for nonpregnant women and 2 drinks per day for men. One drink equals 12 oz of beer, 5 oz of wine, or 1 oz of hard liquor.  Do not drink on an empty stomach.  Keep yourself hydrated with water, diet soda, or unsweetened iced tea.  Keep in mind that regular soda, juice, and other mixers may contain a lot of sugar and must be counted as carbs. What are tips for following this plan?  Reading food labels  Start by checking the serving size on the "Nutrition Facts" label of packaged foods and drinks. The amount of calories, carbs, fats, and other nutrients listed on the label is based on one serving of the item. Many items contain more than one serving per package.  Check the total grams (g) of carbs in one serving. You can calculate the number of servings of carbs in one serving by dividing the total carbs by 15. For example, if a food has 30 g of total carbs, it would be equal to 2 servings of carbs.  Check the number of grams (g) of saturated and trans fats in one serving. Choose foods that have low or no amount of these fats.  Check the number of   milligrams (mg) of salt (sodium) in one serving. Most people should limit total sodium intake to less than 2,300 mg per day.  Always check the nutrition information of foods labeled as "low-fat" or "nonfat". These foods may be higher in added sugar or refined carbs and should be avoided.  Talk to your dietitian to identify your daily goals for nutrients listed on the label. Shopping  Avoid buying canned, premade, or processed foods. These foods tend to be high in fat, sodium,  and added sugar.  Shop around the outside edge of the grocery store. This includes fresh fruits and vegetables, bulk grains, fresh meats, and fresh dairy. Cooking  Use low-heat cooking methods, such as baking, instead of high-heat cooking methods like deep frying.  Cook using healthy oils, such as olive, canola, or sunflower oil.  Avoid cooking with butter, cream, or high-fat meats. Meal planning  Eat meals and snacks regularly, preferably at the same times every day. Avoid going long periods of time without eating.  Eat foods high in fiber, such as fresh fruits, vegetables, beans, and whole grains. Talk to your dietitian about how many servings of carbs you can eat at each meal.  Eat 4-6 ounces (oz) of lean protein each day, such as lean meat, chicken, fish, eggs, or tofu. One oz of lean protein is equal to: ? 1 oz of meat, chicken, or fish. ? 1 egg. ?  cup of tofu.  Eat some foods each day that contain healthy fats, such as avocado, nuts, seeds, and fish. Lifestyle  Check your blood glucose regularly.  Exercise regularly as told by your health care provider. This may include: ? 150 minutes of moderate-intensity or vigorous-intensity exercise each week. This could be brisk walking, biking, or water aerobics. ? Stretching and doing strength exercises, such as yoga or weightlifting, at least 2 times a week.  Take medicines as told by your health care provider.  Do not use any products that contain nicotine or tobacco, such as cigarettes and e-cigarettes. If you need help quitting, ask your health care provider.  Work with a Social worker or diabetes educator to identify strategies to manage stress and any emotional and social challenges. Questions to ask a health care provider  Do I need to meet with a diabetes educator?  Do I need to meet with a dietitian?  What number can I call if I have questions?  When are the best times to check my blood glucose? Where to find more  information:  American Diabetes Association: diabetes.org  Academy of Nutrition and Dietetics: www.eatright.[employer] of Diabetes and Digestive and Kidney Diseases (NIH): [employer] Summary  A healthy meal plan will help you control your blood glucose and maintain a healthy lifestyle.  Working with a diet and nutrition specialist (dietitian) can help you make a meal plan that is best for you.  Keep in mind that carbohydrates (carbs) and alcohol have immediate effects on your blood glucose levels. It is important to count carbs and to use alcohol carefully. This information is not intended to replace advice given to you by your health care provider. Make sure you discuss any questions you have with your health care provider. Document Released: 03/08/2005 Document Revised: 01/09/2017 Document Reviewed: 07/16/2016 Elsevier Interactive Patient Education  2019 [place] all medications as directed, with one change- Lisinopril/HCTZ ONCE DAILY only. Continue to check blood pressure and heart rate daily- record. A1c today- 6.8- EXCELLENT! Please keep follow-up with Nephrology 07/09/2018. Continue to  stay well hydrated and follow heart healthy/diabetic diet. Remain as active as possible. Continue with Pain Management as directed. Follow-up here in 3 months- complete physical with fasting labs. GREAT TO SEE YOU!

## 2018-07-01 NOTE — Assessment & Plan Note (Signed)
Last two BPs have been low 91/49 She denies CP/dizziness/palpitations She reports home readings SBP 90-110s DBP 40-70s She has been on Lisinopril/HCTZ 20/12.5 BID >10 years Her original PCP started her on it BID- She weighed >350 lbs and smoked >1.5 packs/days Current wt 193, stopped tobacco use >6 weeks ago Instructed to take Lisinopril/HCTZ QD stop BID, continue to check BP/HR daily

## 2018-07-01 NOTE — Assessment & Plan Note (Signed)
Been off tobacco >6 weeks- GREAT!

## 2018-07-01 NOTE — Assessment & Plan Note (Signed)
Lab Results  Component Value Date   HGBA1C 6.8 (A) 07/01/2018   HGBA1C 6.7 (A) 03/17/2018   HGBA1C 7.0 (A) 12/11/2017  Home AM B 110-120s Denies episodes of hypoglycemia Continue Metformin 500mg  BID

## 2018-07-22 ENCOUNTER — Other Ambulatory Visit: Payer: Self-pay | Admitting: Adult Health

## 2018-07-23 ENCOUNTER — Ambulatory Visit: Payer: BLUE CROSS/BLUE SHIELD | Admitting: Family Medicine

## 2018-08-06 ENCOUNTER — Ambulatory Visit (INDEPENDENT_AMBULATORY_CARE_PROVIDER_SITE_OTHER): Payer: BLUE CROSS/BLUE SHIELD | Admitting: Family Medicine

## 2018-08-06 ENCOUNTER — Encounter: Payer: Self-pay | Admitting: Family Medicine

## 2018-08-06 VITALS — BP 150/78 | HR 87 | Temp 98.4°F | Ht 67.0 in | Wt 207.0 lb

## 2018-08-06 DIAGNOSIS — Z5329 Procedure and treatment not carried out because of patient's decision for other reasons: Secondary | ICD-10-CM

## 2018-08-06 DIAGNOSIS — Z91199 Patient's noncompliance with other medical treatment and regimen due to unspecified reason: Secondary | ICD-10-CM

## 2018-08-06 NOTE — Progress Notes (Signed)
Procedure was unable to be performed today in the office.  Patient notified that we will call her back to r/s appointment. MPulliam, CMA/RT(R)

## 2018-09-03 LAB — HM MAMMOGRAPHY

## 2018-09-09 ENCOUNTER — Other Ambulatory Visit: Payer: Self-pay | Admitting: Adult Health

## 2018-09-30 ENCOUNTER — Ambulatory Visit: Payer: BLUE CROSS/BLUE SHIELD | Admitting: Adult Health

## 2018-10-18 ENCOUNTER — Other Ambulatory Visit: Payer: Self-pay | Admitting: Adult Health

## 2018-10-20 ENCOUNTER — Telehealth: Payer: Self-pay

## 2018-10-20 ENCOUNTER — Telehealth: Payer: Self-pay | Admitting: Adult Health

## 2018-10-20 DIAGNOSIS — G8929 Other chronic pain: Secondary | ICD-10-CM

## 2018-10-20 NOTE — Telephone Encounter (Signed)
Referral place.  Pt informed.  Charyl Bigger, CMA

## 2018-10-20 NOTE — Telephone Encounter (Signed)
Patient called to request referral to different Pain Mgt provider (says due to circumstances beyond her control she missed an appt & PM kicked her out of program)--- Pt is low on Pain meds & is going to need a Pain provider closed to [place] & it can be w/ [facility] or [place] she states doesn't matter.  -- Forwarding request to medical assistant for review & to call pt with updated information-.  --glh

## 2018-10-20 NOTE — Telephone Encounter (Signed)
Please call pt and have her schedule a telemedicine visit prior to any further refills.  Thanks!  Charyl Bigger, CMA

## 2018-11-04 ENCOUNTER — Telehealth: Payer: Self-pay | Admitting: Adult Health

## 2018-11-04 NOTE — Telephone Encounter (Signed)
Patient called to says she doesn't need Referral to a new Pain Mgt provider her issue has been resolved. --- Forwarding message to [place] referral coord.  --glh

## 2018-11-12 NOTE — Progress Notes (Signed)
Virtual Visit via Video Note  I connected with Alejandra Taylor on 11/13/2018 at  9:15 AM EDT by a video enabled telemedicine application and verified that I am speaking with the correct person using two identifiers.  Location: Patient: Home  Provider: In Clinic   I discussed the limitations of evaluation and management by telemedicine and the availability of in person appointments. The patient expressed understanding and agreed to proceed.  History of Present Illness: 07/01/18 OV: [name] is here for 3 month f/u: HTN, T2D, Chronic Pain Home BP 90-110s  She denies dizziness/CP/HA She reports being on Lisinopril/HCTZ 20/12.5 BID >10 years Her original PCP started her on this due to heavy tobacco use (>1.5pack/day) and morbid obesity (>350 lbs) She stopped tobacco use >6 weeks ago and current wt 193 BP today 91/49 AM BG 110-120s, denies episodes of hypoglycemia She "pretty much follows a non-sugary diet" Due to chronic pain, is unable to exercise, however remains active with house/yardwork Followed by Pain Management monthly Nephrology 07/09/2018 11/12/2018 OV: [name] is connecting via WebEx today for regular f/u: HTN, T2D, Chronic Pain, Depression, PTSD AM BG 110-120s, she denies episodes of hypoglycemia She is currently taking Metformin 560m BID with meals. She reports home BP readings: SBP 120-140s, mostly high 120s, low 130s DBP 70-80s She continues to abstain from tobacco/ETOH use Due to chronic pain she is unable to engage in formal exercise, however can perform house/yard work and walk [place] [employer]. She received "radio frequency nerve burn" therapy to L lumbar back last week, will have therapy on R side next week. She received cortisone injections in L knee and R shoulder recently as well. She is closely followed by Pain Management- currently in Hydrocodone 10/[id number]- denies sedation issues. She reports stable mood, denies SI/HI  Lab Results  Component  Value Date   HGBA1C 6.8 (A) 07/01/2018   HGBA1C 6.7 (A) 03/17/2018   HGBA1C 7.0 (A) 12/11/2017    Patient Care Team    Relationship Specialty Notifications Start End  DaMina Marble, NP PCP - General Family Medicine  04/17/17   DaMelrose NakayamaMD Consulting Physician Orthopedic Surgery  04/18/17   DuJuluis [facility]  08/08/17     Patient Active Problem List   Diagnosis Date Noted  . Urinary incontinence 12/11/2017  . Constipation 12/11/2017  . Need for hepatitis C screening test 12/11/2017  . Diabetes mellitus without complication ([id number]. Healthcare maintenance 06/10/2017  . Colitis 05/24/2017  . Tobacco abuse 05/24/2017  . UTI (urinary tract infection) 05/24/2017  . Depression with anxiety 05/24/2017  . AKI (acute kidney injury) (HCSherrard11/30/2018  . Hyponatremia 05/24/2017  . Migraine with status migrainosus, not intractable 04/17/2017  . Other chronic pain 04/17/2017  . Essential hypertension 04/17/2017  . PTSD (post-traumatic stress disorder) 04/17/2017  . Hypothyroidism 04/17/2017  . Hyperlipidemia 04/17/2017  . Endometrial cancer (HCNew Tri[place]07/30/2013     Past Medical History:  Diagnosis Date  . Chronic back pain   . Colitis   . Diabetes mellitus   . Endometrial adenocarcinoma (HCSarah Ann2011  . History of degenerative disc disease   . Hypercholesterolemia   . Hypertension   . Hyperthyroidism   . Migraines   . Post traumatic stress disorder   . Spinal stenosis      Past Surgical History:  Procedure Laterality Date  . ABDOMINAL HYSTERECTOMY    . CHOLECYSTECTOMY    . GANGLION CYST EXCISION     L wrist cyst removal with  tendon rupture and repair  . ROBOTIC ASSISTED LAP VAGINAL HYSTERECTOMY  08/19/2009   BSO for endo ca  . TONSILLECTOMY       Family History  Problem Relation Age of Onset  . Hypertension Father   . Diabetes Father   . Heart attack Father   . Pancreatic cancer Father   . Colon cancer Maternal Aunt   . Ovarian cancer  Maternal Aunt   . Ovarian cancer Paternal Grandmother   . Stroke Paternal Grandmother   . Diabetes Paternal Grandmother   . Ovarian cancer Cousin   . Breast cancer Cousin      Social History   Substance and Sexual Activity  Drug Use No     Social History   Substance and Sexual Activity  Alcohol Use No     Social History   Tobacco Use  Smoking Status Current Every Day Smoker  . Packs/day: 1.00  . Years: 30.00  . Pack years: 30.00  . Types: Cigarettes  Smokeless Tobacco Never Used     Outpatient Encounter Medications as of 11/13/2018  Medication Sig Note  . albuterol (PROVENTIL HFA;VENTOLIN HFA) 108 (90 Base) MCG/ACT inhaler Inhale 2 puffs into the lungs every 6 (six) hours as needed for wheezing or shortness of breath.   . clonazePAM (KLONOPIN) 1 MG tablet Take 1 mg by mouth 3 (three) times daily. 04/07/2015: Received from: External Pharmacy  . estazolam (PROSOM) 2 MG tablet TAKE 1 TABLET AT BEDTIME AS NEEDED FOR SLEEP MAY REPEAT ONCE AS NEEDED ONLY IF MID-AWAKENING 04/07/2015: Received from: External Pharmacy  . ezetimibe (ZETIA) 10 MG tablet Take 1 tablet (10 mg total) by mouth daily. PATIENT NEEDS TELEMEDICINE VISIT PRIOR TO ANY FURTHER REFILLS   . glucose blood test strip 1 each by Other route as needed. Use as instructed   . HYDROcodone-acetaminophen (NORCO) 10-325 MG tablet TAKE 1 OR 2 TABLETS BY MOUTH EVERY 6 HOURS AS NEEDED FOR PAIN MUST LAST 30 DAYS 04/07/2015: Received from: External Pharmacy  . levothyroxine (SYNTHROID, LEVOTHROID) 125 MCG tablet Take 1 tablet (125 mcg total) by mouth daily.   Marland Kitchen linaclotide (LINZESS) 72 MCG capsule Take 1 capsule (72 mcg total) by mouth daily before breakfast.   . lisinopril-hydrochlorothiazide (PRINZIDE,ZESTORETIC) 20-12.5 MG tablet TAKE 1 TABLET BY MOUTH TWICE A DAY   . metFORMIN (GLUCOPHAGE) 500 MG tablet Take 1 tablet (500 mg total) by mouth daily with breakfast. (Patient taking differently: Take 500 mg by mouth 2 (two)  times daily with a meal. )   . NARCAN 4 MG/0.1ML LIQD nasal spray kit 1 AS NEEDED FOR OVERDOSE   . pantoprazole (PROTONIX) 40 MG tablet Take 1 tablet (40 mg total) by mouth daily. PATIENT MUST HAVE OFFICE VISIT PRIOR TO ANY FURTHER REFILLS   . QUEtiapine (SEROQUEL) 300 MG tablet Take 300 mg by mouth at bedtime.   . simvastatin (ZOCOR) 40 MG tablet TAKE 1 TABLET BY MOUTH EVERY DAY IN THE EVENING   . tiZANidine (ZANAFLEX) 2 MG capsule TAKE 1 CAP AS NEEDED ONCE A DAY FOR MUSCLE SPASM   . topiramate (TOPAMAX) 100 MG tablet Take 1 tablet (100 mg total) by mouth at bedtime. Max dose is 136m at bedtime   . venlafaxine XR (EFFEXOR-XR) 150 MG 24 hr capsule Take 1 capsule every morning and 1 capsule at night (Patient taking differently: Take 300 mg by mouth daily with breakfast. Take 1 capsule every morning and 1 capsule at night)   . [DISCONTINUED] bacitracin 500 UNIT/GM ointment Apply 1  application topically daily.    Facility-Administered Encounter Medications as of 11/13/2018  Medication  . ipratropium-albuterol (DUONEB) 0.5-2.5 (3) MG/3ML nebulizer solution 3 mL    Allergies: Other and Penicillins  Body mass index is 29.07 kg/m.  Blood pressure (!) 144/77, pulse 82, temperature 98.5 F (36.9 C), temperature source Oral, height '5\' 7"'  (1.702 m), weight 185 lb 9.6 oz (84.2 kg). Review of Systems: General:   Denies fever, chills, unexplained weight loss.  Optho/Auditory:   Denies visual changes, blurred vision/LOV Respiratory:   Denies SOB, DOE more than baseline levels.  Cardiovascular:   Denies chest pain, palpitations, new onset peripheral edema  Gastrointestinal:   Denies nausea, vomiting, diarrhea.  Genitourinary: Denies dysuria, freq/ urgency, flank pain or discharge from genitals.  Endocrine:     Denies hot or cold intolerance, polyuria, polydipsia. Musculoskeletal:   Denies unexplained myalgias, joint swelling, unexplained arthralgias, gait problems +.  Skin:  Denies rash, suspicious  lesions Neurological:     Denies dizziness, unexplained weakness, numbness  Psychiatric/Behavioral:   Denies mood changes, suicidal or homicidal ideations, hallucinations This patient does not have sx concerning for COVID-19 Infection (ie; fever, chills, cough, new or worsening shortness of breath). Observations/Objective: No acute distress noted during WebEx video teleconference  Assessment and Plan: Continue all medications as directed. Increase plain water intake, strive for at least 75 oz/day. Follow Diabetic Diet Remain as active as tolerated. Continue with various specialists. Continue to home monitor BG and BP/HR  COVID-19 Education: Signs and symptoms of COVID-19 infection were discussed with pt and how to seek care for testing.  The importance of following the Stay at Home order, and when out- Social Distancing and wearing a facial mask were discussed today. Follow Up Instructions: 1 week- fasting labs 3 months CPE    I discussed the assessment and treatment plan with the patient. The patient was provided an opportunity to ask questions and all were answered. The patient agreed with the plan and demonstrated an understanding of the instructions.   The patient was advised to call back or seek an in-person evaluation if the symptoms worsen or if the condition fails to improve as anticipated.  I provided 25 minutes of non-face-to-face time during this encounter.   Esaw Grandchild, NP

## 2018-11-13 ENCOUNTER — Encounter: Payer: Self-pay | Admitting: Adult Health

## 2018-11-13 ENCOUNTER — Other Ambulatory Visit: Payer: Self-pay

## 2018-11-13 ENCOUNTER — Ambulatory Visit (INDEPENDENT_AMBULATORY_CARE_PROVIDER_SITE_OTHER): Payer: BLUE CROSS/BLUE SHIELD | Admitting: Adult Health

## 2018-11-13 VITALS — BP 144/77 | HR 82 | Temp 98.5°F | Ht 67.0 in | Wt 185.6 lb

## 2018-11-13 DIAGNOSIS — Z0000 Encounter for general adult medical examination without abnormal findings: Secondary | ICD-10-CM

## 2018-11-13 DIAGNOSIS — E785 Hyperlipidemia, unspecified: Secondary | ICD-10-CM

## 2018-11-13 DIAGNOSIS — Z79899 Other long term (current) drug therapy: Secondary | ICD-10-CM

## 2018-11-13 DIAGNOSIS — E039 Hypothyroidism, unspecified: Secondary | ICD-10-CM

## 2018-11-13 DIAGNOSIS — E119 Type 2 diabetes mellitus without complications: Secondary | ICD-10-CM

## 2018-11-13 DIAGNOSIS — I10 Essential (primary) hypertension: Secondary | ICD-10-CM

## 2018-11-13 MED ORDER — LINACLOTIDE 72 MCG PO CAPS: 72.0000 ug | ORAL_CAPSULE | Freq: Every day | ORAL | 1 refills | Status: DC

## 2018-11-13 MED ORDER — EZETIMIBE 10 MG PO TABS: 10.0000 mg | ORAL_TABLET | Freq: Every day | ORAL | 1 refills | Status: DC

## 2018-11-13 MED ORDER — SIMVASTATIN 40 MG PO TABS: 40.0000 mg | ORAL_TABLET | Freq: Every day | ORAL | 1 refills | Status: DC

## 2018-11-13 NOTE — Assessment & Plan Note (Signed)
Assessment and Plan: Continue all medications as directed. Increase plain water intake, strive for at least 75 oz/day. Follow Diabetic Diet Remain as active as tolerated. Continue with various specialists. Continue to home monitor BG and BP/HR  COVID-19 Education: Signs and symptoms of COVID-19 infection were discussed with pt and how to seek care for testing.  The importance of following the Stay at Home order, and when out- Social Distancing and wearing a facial mask were discussed today. Follow Up Instructions: 1 week- fasting labs 6 months CPE    I discussed the assessment and treatment plan with the patient. The patient was provided an opportunity to ask questions and all were answered. The patient agreed with the plan and demonstrated an understanding of the instructions.   The patient was advised to call back or seek an in-person evaluation if the symptoms worsen or if the condition fails to improve as anticipated.

## 2018-11-13 NOTE — Addendum Note (Signed)
Addended by: Fonnie Mu on: 11/13/2018 10:03 AM   Modules accepted: Orders

## 2018-12-06 ENCOUNTER — Other Ambulatory Visit: Payer: Self-pay | Admitting: Adult Health

## 2018-12-21 ENCOUNTER — Other Ambulatory Visit: Payer: Self-pay | Admitting: Adult Health

## 2018-12-24 ENCOUNTER — Other Ambulatory Visit: Payer: BLUE CROSS/BLUE SHIELD

## 2018-12-25 ENCOUNTER — Other Ambulatory Visit: Payer: Self-pay | Admitting: Adult Health

## 2018-12-31 ENCOUNTER — Encounter: Payer: BLUE CROSS/BLUE SHIELD | Admitting: Adult Health

## 2019-03-04 ENCOUNTER — Other Ambulatory Visit: Payer: Medicare Other

## 2019-03-04 ENCOUNTER — Other Ambulatory Visit: Payer: Self-pay

## 2019-03-04 DIAGNOSIS — I10 Essential (primary) hypertension: Secondary | ICD-10-CM

## 2019-03-04 DIAGNOSIS — Z0000 Encounter for general adult medical examination without abnormal findings: Secondary | ICD-10-CM

## 2019-03-04 DIAGNOSIS — E039 Hypothyroidism, unspecified: Secondary | ICD-10-CM

## 2019-03-04 DIAGNOSIS — E785 Hyperlipidemia, unspecified: Secondary | ICD-10-CM

## 2019-03-04 DIAGNOSIS — Z79899 Other long term (current) drug therapy: Secondary | ICD-10-CM

## 2019-03-04 DIAGNOSIS — E119 Type 2 diabetes mellitus without complications: Secondary | ICD-10-CM

## 2019-03-05 ENCOUNTER — Other Ambulatory Visit: Payer: Self-pay | Admitting: Adult Health

## 2019-03-05 LAB — CBC WITH DIFFERENTIAL/PLATELET
Basophils Absolute: 0.1 10*3/uL (ref 0.0–0.2)
Basos: 1 %
EOS (ABSOLUTE): 0.5 10*3/uL — ABNORMAL HIGH (ref 0.0–0.4)
Eos: 5 %
Hematocrit: 40.8 % (ref 34.0–46.6)
Hemoglobin: 14.8 g/dL (ref 11.1–15.9)
Immature Grans (Abs): 0 10*3/uL (ref 0.0–0.1)
Immature Granulocytes: 0 %
Lymphocytes Absolute: 2.6 10*3/uL (ref 0.7–3.1)
Lymphs: 26 %
MCH: 33.6 pg — ABNORMAL HIGH (ref 26.6–33.0)
MCHC: 36.3 g/dL — ABNORMAL HIGH (ref 31.5–35.7)
MCV: 93 fL (ref 79–97)
Monocytes Absolute: 0.6 10*3/uL (ref 0.1–0.9)
Monocytes: 6 %
Neutrophils Absolute: 6.1 10*3/uL (ref 1.4–7.0)
Neutrophils: 62 %
Platelets: 323 10*3/uL (ref 150–450)
RBC: 4.4 x10E6/uL (ref 3.77–5.28)
RDW: 12.2 % (ref 11.7–15.4)
WBC: 9.9 10*3/uL (ref 3.4–10.8)

## 2019-03-05 LAB — COMPREHENSIVE METABOLIC PANEL
ALT: 11 IU/L (ref 0–32)
AST: 15 IU/L (ref 0–40)
Albumin/Globulin Ratio: 1.9 (ref 1.2–2.2)
Albumin: 4.5 g/dL (ref 3.8–4.9)
Alkaline Phosphatase: 76 IU/L (ref 39–117)
BUN/Creatinine Ratio: 23 (ref 9–23)
BUN: 20 mg/dL (ref 6–24)
Bilirubin Total: 0.2 mg/dL (ref 0.0–1.2)
CO2: 24 mmol/L (ref 20–29)
Calcium: 9.9 mg/dL (ref 8.7–10.2)
Chloride: 82 mmol/L — ABNORMAL LOW (ref 96–106)
Creatinine, Ser: 0.86 mg/dL (ref 0.57–1.00)
GFR calc Af Amer: 86 mL/min/{1.73_m2} (ref >59)
GFR calc non Af Amer: 75 mL/min/{1.73_m2} (ref >59)
Globulin, Total: 2.4 g/dL (ref 1.5–4.5)
Glucose: 133 mg/dL — ABNORMAL HIGH (ref 65–99)
Potassium: 3.3 mmol/L — ABNORMAL LOW (ref 3.5–5.2)
Sodium: 121 mmol/L — ABNORMAL LOW (ref 134–144)
Total Protein: 6.9 g/dL (ref 6.0–8.5)

## 2019-03-05 LAB — LIPID PANEL
Chol/HDL Ratio: 2.7 ratio (ref 0.0–4.4)
Cholesterol, Total: 159 mg/dL (ref 100–199)
HDL: 60 mg/dL (ref >39)
LDL Chol Calc (NIH): 55 mg/dL (ref 0–99)
Triglycerides: 286 mg/dL — ABNORMAL HIGH (ref 0–149)
VLDL Cholesterol Cal: 44 mg/dL — ABNORMAL HIGH (ref 5–40)

## 2019-03-05 LAB — TSH: TSH: 4.76 u[IU]/mL — ABNORMAL HIGH (ref 0.450–4.500)

## 2019-03-05 LAB — HEMOGLOBIN A1C
Est. average glucose Bld gHb Est-mCnc: 160 mg/dL
Hgb A1c MFr Bld: 7.2 % — ABNORMAL HIGH (ref 4.8–5.6)

## 2019-03-09 ENCOUNTER — Ambulatory Visit (INDEPENDENT_AMBULATORY_CARE_PROVIDER_SITE_OTHER): Payer: BC Managed Care – PPO | Admitting: Adult Health

## 2019-03-09 ENCOUNTER — Other Ambulatory Visit: Payer: Self-pay

## 2019-03-09 ENCOUNTER — Encounter: Payer: Self-pay | Admitting: Adult Health

## 2019-03-09 VITALS — BP 100/67 | HR 90 | Temp 98.6°F | Ht 66.5 in | Wt 197.3 lb

## 2019-03-09 DIAGNOSIS — E119 Type 2 diabetes mellitus without complications: Secondary | ICD-10-CM

## 2019-03-09 DIAGNOSIS — F17210 Nicotine dependence, cigarettes, uncomplicated: Secondary | ICD-10-CM

## 2019-03-09 DIAGNOSIS — Z122 Encounter for screening for malignant neoplasm of respiratory organs: Secondary | ICD-10-CM | POA: Diagnosis not present

## 2019-03-09 DIAGNOSIS — Z23 Encounter for immunization: Secondary | ICD-10-CM

## 2019-03-09 DIAGNOSIS — I10 Essential (primary) hypertension: Secondary | ICD-10-CM

## 2019-03-09 DIAGNOSIS — Z0000 Encounter for general adult medical examination without abnormal findings: Secondary | ICD-10-CM

## 2019-03-09 LAB — POCT UA - MICROALBUMIN
Albumin/Creatinine Ratio, Urine, POC: >300
Creatinine, POC: 10 mg/dL
Microalbumin Ur, POC: 80 mg/L

## 2019-03-09 MED ORDER — TOPIRAMATE 100 MG PO TABS: 100.0000 mg | ORAL_TABLET | Freq: Every day | ORAL | 1 refills | Status: DC

## 2019-03-09 MED ORDER — LEVOTHYROXINE SODIUM 125 MCG PO TABS: 125.0000 ug | ORAL_TABLET | Freq: Every day | ORAL | 0 refills | Status: DC

## 2019-03-09 MED ORDER — SIMVASTATIN 40 MG PO TABS: 40.0000 mg | ORAL_TABLET | Freq: Every day | ORAL | 1 refills | Status: DC

## 2019-03-09 MED ORDER — SHINGRIX 50 MCG/0.5ML IM SUSR: 0.5000 mL | Freq: Once | INTRAMUSCULAR | 0 refills | Status: AC

## 2019-03-09 MED ORDER — METFORMIN HCL 500 MG PO TABS: 500.0000 mg | ORAL_TABLET | Freq: Two times a day (BID) | ORAL | 2 refills | Status: DC

## 2019-03-09 MED ORDER — LINACLOTIDE 72 MCG PO CAPS: 72.0000 ug | ORAL_CAPSULE | Freq: Every day | ORAL | 1 refills | Status: DC

## 2019-03-09 MED ORDER — VENLAFAXINE HCL ER 150 MG PO CP24: ORAL_CAPSULE | ORAL | 3 refills | Status: DC

## 2019-03-09 MED ORDER — TETANUS-DIPHTH-ACELL PERTUSSIS 5-2.5-18.5 LF-MCG/0.5 IM SUSP: 0.5000 mL | Freq: Once | INTRAMUSCULAR | 0 refills | Status: AC

## 2019-03-09 MED ORDER — VENLAFAXINE HCL ER 150 MG PO CP24: ORAL_CAPSULE | ORAL | 0 refills | Status: DC

## 2019-03-09 NOTE — Assessment & Plan Note (Signed)
To help reduce weight, lower blood sugar- follow diabetic diet/low salt diet and remain as active as possible. Maintain fluid intake at < 1500 mls/day= 50 ounces day. If you experience any mental status change or extreme lethargy seek immediate medical care. We will call you with lab results. If low sodium continues to be an issue- will discuss reducing venlafaxine. Follow-up in 6 weeks. Continue to social distance and wear a mask when in public.

## 2019-03-09 NOTE — Assessment & Plan Note (Signed)
Lab Results  Component Value Date   HGBA1C 7.2 (H) 03/04/2019   HGBA1C 6.8 (A) 07/01/2018   HGBA1C 6.7 (A) 03/17/2018  AM BG 100-120s, denies episodes of hypoglycemia She reports increased CHO/sugar snacks 12 lb wt gain since May 2020 Currently on Metformin 500mg  BID

## 2019-03-09 NOTE — Patient Instructions (Addendum)
Preventive Care for Adults, Female  A healthy lifestyle and preventive care can promote health and wellness. Preventive health guidelines for women include the following key practices.   A routine yearly physical is a good way to check with your health care provider about your health and preventive screening. It is a chance to share any concerns and updates on your health and to receive a thorough exam.   Visit your dentist for a routine exam and preventive care every 6 months. Brush your teeth twice a day and floss once a day. Good oral hygiene prevents tooth decay and gum disease.   The frequency of eye exams is based on your age, health, family medical history, use of contact lenses, and other factors. Follow your health care provider's recommendations for frequency of eye exams.   Eat a healthy diet. Foods like vegetables, fruits, whole grains, low-fat dairy products, and lean protein foods contain the nutrients you need without too many calories. Decrease your intake of foods high in solid fats, added sugars, and salt. Eat the right amount of calories for you.Get information about a proper diet from your health care provider, if necessary.   Regular physical exercise is one of the most important things you can do for your health. Most adults should get at least 150 minutes of moderate-intensity exercise (any activity that increases your heart rate and causes you to sweat) each week. In addition, most adults need muscle-strengthening exercises on 2 or more days a week.   Maintain a healthy weight. The body mass index (BMI) is a screening tool to identify possible weight problems. It provides an estimate of body fat based on height and weight. Your health care provider can find your BMI, and can help you achieve or maintain a healthy weight.For adults 20 years and older:   - A BMI below 18.5 is considered underweight.   - A BMI of 18.5 to 24.9 is normal.   - A BMI of 25 to 29.9 is  considered overweight.   - A BMI of 30 and above is considered obese.   Maintain normal blood lipids and cholesterol levels by exercising and minimizing your intake of trans and saturated fats.  Eat a balanced diet with plenty of fruit and vegetables. Blood tests for lipids and cholesterol should begin at age 20 and be repeated every 5 years minimum.  If your lipid or cholesterol levels are high, you are over 40, or you are at high risk for heart disease, you may need your cholesterol levels checked more frequently.Ongoing high lipid and cholesterol levels should be treated with medicines if diet and exercise are not working.   If you smoke, find out from your health care provider how to quit. If you do not use tobacco, do not start.   Lung cancer screening is recommended for adults aged 55-80 years who are at high risk for developing lung cancer because of a history of smoking. A yearly low-dose CT scan of the lungs is recommended for people who have at least a 30-pack-year history of smoking and are a current smoker or have quit within the past 15 years. A pack year of smoking is smoking an average of 1 pack of cigarettes a day for 1 year (for example: 1 pack a day for 30 years or 2 packs a day for 15 years). Yearly screening should continue until the smoker has stopped smoking for at least 15 years. Yearly screening should be stopped for people who develop a   health problem that would prevent them from having lung cancer treatment.   If you are pregnant, do not drink alcohol. If you are breastfeeding, be very cautious about drinking alcohol. If you are not pregnant and choose to drink alcohol, do not have more than 1 drink per day. One drink is considered to be 12 ounces (355 mL) of beer, 5 ounces (148 mL) of wine, or 1.5 ounces (44 mL) of liquor.   Avoid use of street drugs. Do not share needles with anyone. Ask for help if you need support or instructions about stopping the use of  drugs.   High blood pressure causes heart disease and increases the risk of stroke. Your blood pressure should be checked at least yearly.  Ongoing high blood pressure should be treated with medicines if weight loss and exercise do not work.   If you are 69-55 years old, ask your health care provider if you should take aspirin to prevent strokes.   Diabetes screening involves taking a blood sample to check your fasting blood sugar level. This should be done once every 3 years, after age 38, if you are within normal weight and without risk factors for diabetes. Testing should be considered at a younger age or be carried out more frequently if you are overweight and have at least 1 risk factor for diabetes.   Breast cancer screening is essential preventive care for women. You should practice "breast self-awareness."  This means understanding the normal appearance and feel of your breasts and may include breast self-examination.  Any changes detected, no matter how small, should be reported to a health care provider.  Women in their 80s and 30s should have a clinical breast exam (CBE) by a health care provider as part of a regular health exam every 1 to 3 years.  After age 66, women should have a CBE every year.  Starting at age 1, women should consider having a mammogram (breast X-ray test) every year.  Women who have a family history of breast cancer should talk to their health care provider about genetic screening.  Women at a high risk of breast cancer should talk to their health care providers about having an MRI and a mammogram every year.   -Breast cancer gene (BRCA)-related cancer risk assessment is recommended for women who have family members with BRCA-related cancers. BRCA-related cancers include breast, ovarian, tubal, and peritoneal cancers. Having family members with these cancers may be associated with an increased risk for harmful changes (mutations) in the breast cancer genes BRCA1 and  BRCA2. Results of the assessment will determine the need for genetic counseling and BRCA1 and BRCA2 testing.   The Pap test is a screening test for cervical cancer. A Pap test can show cell changes on the cervix that might become cervical cancer if left untreated. A Pap test is a procedure in which cells are obtained and examined from the lower end of the uterus (cervix).   - Women should have a Pap test starting at age 57.   - Between ages 90 and 70, Pap tests should be repeated every 2 years.   - Beginning at age 63, you should have a Pap test every 3 years as long as the past 3 Pap tests have been normal.   - Some women have medical problems that increase the chance of getting cervical cancer. Talk to your health care provider about these problems. It is especially important to talk to your health care provider if a  new problem develops soon after your last Pap test. In these cases, your health care provider may recommend more frequent screening and Pap tests.   - The above recommendations are the same for women who have or have not gotten the vaccine for human papillomavirus (HPV).   - If you had a hysterectomy for a problem that was not cancer or a condition that could lead to cancer, then you no longer need Pap tests. Even if you no longer need a Pap test, a regular exam is a good idea to make sure no other problems are starting.   - If you are between ages 36 and 66 years, and you have had normal Pap tests going back 10 years, you no longer need Pap tests. Even if you no longer need a Pap test, a regular exam is a good idea to make sure no other problems are starting.   - If you have had past treatment for cervical cancer or a condition that could lead to cancer, you need Pap tests and screening for cancer for at least 20 years after your treatment.   - If Pap tests have been discontinued, risk factors (such as a new sexual partner) need to be reassessed to determine if screening should  be resumed.   - The HPV test is an additional test that may be used for cervical cancer screening. The HPV test looks for the virus that can cause the cell changes on the cervix. The cells collected during the Pap test can be tested for HPV. The HPV test could be used to screen women aged 70 years and older, and should be used in women of any age who have unclear Pap test results. After the age of 67, women should have HPV testing at the same frequency as a Pap test.   Colorectal cancer can be detected and often prevented. Most routine colorectal cancer screening begins at the age of 57 years and continues through age 26 years. However, your health care provider may recommend screening at an earlier age if you have risk factors for colon cancer. On a yearly basis, your health care provider may provide home test kits to check for hidden blood in the stool.  Use of a small camera at the end of a tube, to directly examine the colon (sigmoidoscopy or colonoscopy), can detect the earliest forms of colorectal cancer. Talk to your health care provider about this at age 23, when routine screening begins. Direct exam of the colon should be repeated every 5 -10 years through age 49 years, unless early forms of pre-cancerous polyps or small growths are found.   People who are at an increased risk for hepatitis B should be screened for this virus. You are considered at high risk for hepatitis B if:  -You were born in a country where hepatitis B occurs often. Talk with your health care provider about which countries are considered high risk.  - Your parents were born in a high-risk country and you have not received a shot to protect against hepatitis B (hepatitis B vaccine).  - You have HIV or AIDS.  - You use needles to inject street drugs.  - You live with, or have sex with, someone who has Hepatitis B.  - You get hemodialysis treatment.  - You take certain medicines for conditions like cancer, organ  transplantation, and autoimmune conditions.   Hepatitis C blood testing is recommended for all people born from [birth year] through 1965 and any individual  with known risks for hepatitis C.   Practice safe sex. Use condoms and avoid high-risk sexual practices to reduce the spread of sexually transmitted infections (STIs). STIs include gonorrhea, chlamydia, syphilis, trichomonas, herpes, HPV, and human immunodeficiency virus (HIV). Herpes, HIV, and HPV are viral illnesses that have no cure. They can result in disability, cancer, and death. Sexually active women aged 25 years and younger should be checked for chlamydia. Older women with new or multiple partners should also be tested for chlamydia. Testing for other STIs is recommended if you are sexually active and at increased risk.   Osteoporosis is a disease in which the bones lose minerals and strength with aging. This can result in serious bone fractures or breaks. The risk of osteoporosis can be identified using a bone density scan. Women ages 65 years and over and women at risk for fractures or osteoporosis should discuss screening with their health care providers. Ask your health care provider whether you should take a calcium supplement or vitamin D to There are also several preventive steps women can take to avoid osteoporosis and resulting fractures or to keep osteoporosis from worsening. -->Recommendations include:  Eat a balanced diet high in fruits, vegetables, calcium, and vitamins.  Get enough calcium. The recommended total intake of is 1,200 mg daily; for best absorption, if taking supplements, divide doses into 250-500 mg doses throughout the day. Of the two types of calcium, calcium carbonate is best absorbed when taken with food but calcium citrate can be taken on an empty stomach.  Get enough vitamin D. NAMS and the National Osteoporosis Foundation recommend at least 1,000 IU per day for women age 50 and over who are at risk of vitamin D  deficiency. Vitamin D deficiency can be caused by inadequate sun exposure (for example, those who live in northern latitudes).  Avoid alcohol and smoking. Heavy alcohol intake (more than 7 drinks per week) increases the risk of falls and hip fracture and women smokers tend to lose bone more rapidly and have lower bone mass than nonsmokers. Stopping smoking is one of the most important changes women can make to improve their health and decrease risk for disease.  Be physically active every day. Weight-bearing exercise (for example, fast walking, hiking, jogging, and weight training) may strengthen bones or slow the rate of bone loss that comes with aging. Balancing and muscle-strengthening exercises can reduce the risk of falling and fracture.  Consider therapeutic medications. Currently, several types of effective drugs are available. Healthcare providers can recommend the type most appropriate for each woman.  Eliminate environmental factors that may contribute to accidents. Falls cause nearly 90% of all osteoporotic fractures, so reducing this risk is an important bone-health strategy. Measures include ample lighting, removing obstructions to walking, using nonskid rugs on floors, and placing mats and/or grab bars in showers.  Be aware of medication side effects. Some common medicines make bones weaker. These include a type of steroid drug called glucocorticoids used for arthritis and asthma, some antiseizure drugs, certain sleeping pills, treatments for endometriosis, and some cancer drugs. An overactive thyroid gland or using too much thyroid hormone for an underactive thyroid can also be a problem. If you are taking these medicines, talk to your doctor about what you can do to help protect your bones.reduce the rate of osteoporosis.    Menopause can be associated with physical symptoms and risks. Hormone replacement therapy is available to decrease symptoms and risks. You should talk to your  health care provider   about whether hormone replacement therapy is right for you.   Use sunscreen. Apply sunscreen liberally and repeatedly throughout the day. You should seek shade when your shadow is shorter than you. Protect yourself by wearing long sleeves, pants, a wide-brimmed hat, and sunglasses year round, whenever you are outdoors.   Once a month, do a whole body skin exam, using a mirror to look at the skin on your back. Tell your health care provider of new moles, moles that have irregular borders, moles that are larger than a pencil eraser, or moles that have changed in shape or color.   -Stay current with required vaccines (immunizations).   Influenza vaccine. All adults should be immunized every year.  Tetanus, diphtheria, and acellular pertussis (Td, Tdap) vaccine. Pregnant women should receive 1 dose of Tdap vaccine during each pregnancy. The dose should be obtained regardless of the length of time since the last dose. Immunization is preferred during the 27th 36th week of gestation. An adult who has not previously received Tdap or who does not know her vaccine status should receive 1 dose of Tdap. This initial dose should be followed by tetanus and diphtheria toxoids (Td) booster doses every 10 years. Adults with an unknown or incomplete history of completing a 3-dose immunization series with Td-containing vaccines should begin or complete a primary immunization series including a Tdap dose. Adults should receive a Td booster every 10 years.  Varicella vaccine. An adult without evidence of immunity to varicella should receive 2 doses or a second dose if she has previously received 1 dose. Pregnant females who do not have evidence of immunity should receive the first dose after pregnancy. This first dose should be obtained before leaving the health care facility. The second dose should be obtained 4 8 weeks after the first dose.  Human papillomavirus (HPV) vaccine. Females aged 13 26  years who have not received the vaccine previously should obtain the 3-dose series. The vaccine is not recommended for use in pregnant females. However, pregnancy testing is not needed before receiving a dose. If a female is found to be pregnant after receiving a dose, no treatment is needed. In that case, the remaining doses should be delayed until after the pregnancy. Immunization is recommended for any person with an immunocompromised condition through the age of 26 years if she did not get any or all doses earlier. During the 3-dose series, the second dose should be obtained 4 8 weeks after the first dose. The third dose should be obtained 24 weeks after the first dose and 16 weeks after the second dose.  Zoster vaccine. One dose is recommended for adults aged 60 years or older unless certain conditions are present.  Measles, mumps, and rubella (MMR) vaccine. Adults born before 1957 generally are considered immune to measles and mumps. Adults born in 1957 or later should have 1 or more doses of MMR vaccine unless there is a contraindication to the vaccine or there is laboratory evidence of immunity to each of the three diseases. A routine second dose of MMR vaccine should be obtained at least 28 days after the first dose for students attending postsecondary schools, health care workers, or international travelers. People who received inactivated measles vaccine or an unknown type of measles vaccine during 1963 1967 should receive 2 doses of MMR vaccine. People who received inactivated mumps vaccine or an unknown type of mumps vaccine before 1979 and are at high risk for mumps infection should consider immunization with 2 doses of   MMR vaccine. For females of childbearing age, rubella immunity should be determined. If there is no evidence of immunity, females who are not pregnant should be vaccinated. If there is no evidence of immunity, females who are pregnant should delay immunization until after pregnancy.  Unvaccinated health care workers born before [birth year] who lack laboratory evidence of measles, mumps, or rubella immunity or laboratory confirmation of disease should consider measles and mumps immunization with 2 doses of MMR vaccine or rubella immunization with 1 dose of MMR vaccine.  Pneumococcal 13-valent conjugate (PCV13) vaccine. When indicated, a person who is uncertain of her immunization history and has no record of immunization should receive the PCV13 vaccine. An adult aged 54 years or older who has certain medical conditions and has not been previously immunized should receive 1 dose of PCV13 vaccine. This PCV13 should be followed with a dose of pneumococcal polysaccharide (PPSV23) vaccine. The PPSV23 vaccine dose should be obtained at least 8 weeks after the dose of PCV13 vaccine. An adult aged 58 years or older who has certain medical conditions and previously received 1 or more doses of PPSV23 vaccine should receive 1 dose of PCV13. The PCV13 vaccine dose should be obtained 1 or more years after the last PPSV23 vaccine dose.  Pneumococcal polysaccharide (PPSV23) vaccine. When PCV13 is also indicated, PCV13 should be obtained first. All adults aged 58 years and older should be immunized. An adult younger than age 65 years who has certain medical conditions should be immunized. Any person who resides in a nursing home or long-term care facility should be immunized. An adult smoker should be immunized. People with an immunocompromised condition and certain other conditions should receive both PCV13 and PPSV23 vaccines. People with human immunodeficiency virus (HIV) infection should be immunized as soon as possible after diagnosis. Immunization during chemotherapy or radiation therapy should be avoided. Routine use of PPSV23 vaccine is not recommended for American Indians, [place] Natives, or people younger than 65 years unless there are medical conditions that require PPSV23 vaccine. When indicated,  people who have unknown immunization and have no record of immunization should receive PPSV23 vaccine. One-time revaccination 5 years after the first dose of PPSV23 is recommended for people aged 70 64 years who have chronic kidney failure, nephrotic syndrome, asplenia, or immunocompromised conditions. People who received 1 2 doses of PPSV23 before age 32 years should receive another dose of PPSV23 vaccine at age 96 years or later if at least 5 years have passed since the previous dose. Doses of PPSV23 are not needed for people immunized with PPSV23 at or after age 55 years.  Meningococcal vaccine. Adults with asplenia or persistent complement component deficiencies should receive 2 doses of quadrivalent meningococcal conjugate (MenACWY-D) vaccine. The doses should be obtained at least 2 months apart. Microbiologists working with certain meningococcal bacteria, [employer] recruits, people at risk during an outbreak, and people who travel to or live in countries with a high rate of meningitis should be immunized. A first-year college student up through age 58 years who is living in a residence hall should receive a dose if she did not receive a dose on or after her 16th birthday. Adults who have certain high-risk conditions should receive one or more doses of vaccine.  Hepatitis A vaccine. Adults who wish to be protected from this disease, have certain high-risk conditions, work with hepatitis A-infected animals, work in hepatitis A research labs, or travel to or work in countries with a high rate of hepatitis A should be  immunized. Adults who were previously unvaccinated and who anticipate close contact with an international adoptee during the first 60 days after arrival in the [place] from a country with a high rate of hepatitis A should be immunized.  Hepatitis B vaccine.  Adults who wish to be protected from this disease, have certain high-risk conditions, may be exposed to blood or other infectious  body fluids, are household contacts or sex partners of hepatitis B positive people, are clients or workers in certain care facilities, or travel to or work in countries with a high rate of hepatitis B should be immunized.  Haemophilus influenzae type b (Hib) vaccine. A previously unvaccinated person with asplenia or sickle cell disease or having a scheduled splenectomy should receive 1 dose of Hib vaccine. Regardless of previous immunization, a recipient of a hematopoietic stem cell transplant should receive a 3-dose series 6 12 months after her successful transplant. Hib vaccine is not recommended for adults with HIV infection.  Preventive Services / Frequency Ages 6 to 39years  Blood pressure check.** / Every 1 to 2 years.  Lipid and cholesterol check.** / Every 5 years beginning at age 39.  Clinical breast exam.** / Every 3 years for women in their 61s and 62s.  BRCA-related cancer risk assessment.** / For women who have family members with a BRCA-related cancer (breast, ovarian, tubal, or peritoneal cancers).  Pap test.** / Every 2 years from ages 47 through 85. Every 3 years starting at age 34 through age 12 or 74 with a history of 3 consecutive normal Pap tests.  HPV screening.** / Every 3 years from ages 46 through ages 43 to 54 with a history of 3 consecutive normal Pap tests.  Hepatitis C blood test.** / For any individual with known risks for hepatitis C.  Skin self-exam. / Monthly.  Influenza vaccine. / Every year.  Tetanus, diphtheria, and acellular pertussis (Tdap, Td) vaccine.** / Consult your health care provider. Pregnant women should receive 1 dose of Tdap vaccine during each pregnancy. 1 dose of Td every 10 years.  Varicella vaccine.** / Consult your health care provider. Pregnant females who do not have evidence of immunity should receive the first dose after pregnancy.  HPV vaccine. / 3 doses over 6 months, if 64 and younger. The vaccine is not recommended for use in  pregnant females. However, pregnancy testing is not needed before receiving a dose.  Measles, mumps, rubella (MMR) vaccine.** / You need at least 1 dose of MMR if you were born in 1957 or later. You may also need a 2nd dose. For females of childbearing age, rubella immunity should be determined. If there is no evidence of immunity, females who are not pregnant should be vaccinated. If there is no evidence of immunity, females who are pregnant should delay immunization until after pregnancy.  Pneumococcal 13-valent conjugate (PCV13) vaccine.** / Consult your health care provider.  Pneumococcal polysaccharide (PPSV23) vaccine.** / 1 to 2 doses if you smoke cigarettes or if you have certain conditions.  Meningococcal vaccine.** / 1 dose if you are age 71 to 37 years and a Market researcher living in a residence hall, or have one of several medical conditions, you need to get vaccinated against meningococcal disease. You may also need additional booster doses.  Hepatitis A vaccine.** / Consult your health care provider.  Hepatitis B vaccine.** / Consult your health care provider.  Haemophilus influenzae type b (Hib) vaccine.** / Consult your health care provider.  Ages 55 to 64years  Blood pressure check.** / Every 1 to 2 years.  Lipid and cholesterol check.** / Every 5 years beginning at age 20 years.  Lung cancer screening. / Every year if you are aged 55 80 years and have a 30-pack-year history of smoking and currently smoke or have quit within the past 15 years. Yearly screening is stopped once you have quit smoking for at least 15 years or develop a health problem that would prevent you from having lung cancer treatment.  Clinical breast exam.** / Every year after age 40 years.  BRCA-related cancer risk assessment.** / For women who have family members with a BRCA-related cancer (breast, ovarian, tubal, or peritoneal cancers).  Mammogram.** / Every year beginning at age 40  years and continuing for as long as you are in good health. Consult with your health care provider.  Pap test.** / Every 3 years starting at age 30 years through age 65 or 70 years with a history of 3 consecutive normal Pap tests.  HPV screening.** / Every 3 years from ages 30 years through ages 65 to 70 years with a history of 3 consecutive normal Pap tests.  Fecal occult blood test (FOBT) of stool. / Every year beginning at age 50 years and continuing until age 75 years. You may not need to do this test if you get a colonoscopy every 10 years.  Flexible sigmoidoscopy or colonoscopy.** / Every 5 years for a flexible sigmoidoscopy or every 10 years for a colonoscopy beginning at age 50 years and continuing until age 75 years.  Hepatitis C blood test.** / For all people born from 1945 through 1965 and any individual with known risks for hepatitis C.  Skin self-exam. / Monthly.  Influenza vaccine. / Every year.  Tetanus, diphtheria, and acellular pertussis (Tdap/Td) vaccine.** / Consult your health care provider. Pregnant women should receive 1 dose of Tdap vaccine during each pregnancy. 1 dose of Td every 10 years.  Varicella vaccine.** / Consult your health care provider. Pregnant females who do not have evidence of immunity should receive the first dose after pregnancy.  Zoster vaccine.** / 1 dose for adults aged 60 years or older.  Measles, mumps, rubella (MMR) vaccine.** / You need at least 1 dose of MMR if you were born in 1957 or later. You may also need a 2nd dose. For females of childbearing age, rubella immunity should be determined. If there is no evidence of immunity, females who are not pregnant should be vaccinated. If there is no evidence of immunity, females who are pregnant should delay immunization until after pregnancy.  Pneumococcal 13-valent conjugate (PCV13) vaccine.** / Consult your health care provider.  Pneumococcal polysaccharide (PPSV23) vaccine.** / 1 to 2 doses if  you smoke cigarettes or if you have certain conditions.  Meningococcal vaccine.** / Consult your health care provider.  Hepatitis A vaccine.** / Consult your health care provider.  Hepatitis B vaccine.** / Consult your health care provider.  Haemophilus influenzae type b (Hib) vaccine.** / Consult your health care provider.  Ages 65 years and over  Blood pressure check.** / Every 1 to 2 years.  Lipid and cholesterol check.** / Every 5 years beginning at age 20 years.  Lung cancer screening. / Every year if you are aged 55 80 years and have a 30-pack-year history of smoking and currently smoke or have quit within the past 15 years. Yearly screening is stopped once you have quit smoking for at least 15 years or develop a health problem that   would prevent you from having lung cancer treatment.  Clinical breast exam.** / Every year after age 103 years.  BRCA-related cancer risk assessment.** / For women who have family members with a BRCA-related cancer (breast, ovarian, tubal, or peritoneal cancers).  Mammogram.** / Every year beginning at age 36 years and continuing for as long as you are in good health. Consult with your health care provider.  Pap test.** / Every 3 years starting at age 5 years through age 85 or 10 years with 3 consecutive normal Pap tests. Testing can be stopped between 65 and 70 years with 3 consecutive normal Pap tests and no abnormal Pap or HPV tests in the past 10 years.  HPV screening.** / Every 3 years from ages 93 years through ages 70 or 45 years with a history of 3 consecutive normal Pap tests. Testing can be stopped between 65 and 70 years with 3 consecutive normal Pap tests and no abnormal Pap or HPV tests in the past 10 years.  Fecal occult blood test (FOBT) of stool. / Every year beginning at age 8 years and continuing until age 45 years. You may not need to do this test if you get a colonoscopy every 10 years.  Flexible sigmoidoscopy or colonoscopy.** /  Every 5 years for a flexible sigmoidoscopy or every 10 years for a colonoscopy beginning at age 69 years and continuing until age 68 years.  Hepatitis C blood test.** / For all people born from [birth year] through 1965 and any individual with known risks for hepatitis C.  Osteoporosis screening.** / A one-time screening for women ages 7 years and over and women at risk for fractures or osteoporosis.  Skin self-exam. / Monthly.  Influenza vaccine. / Every year.  Tetanus, diphtheria, and acellular pertussis (Tdap/Td) vaccine.** / 1 dose of Td every 10 years.  Varicella vaccine.** / Consult your health care provider.  Zoster vaccine.** / 1 dose for adults aged 5 years or older.  Pneumococcal 13-valent conjugate (PCV13) vaccine.** / Consult your health care provider.  Pneumococcal polysaccharide (PPSV23) vaccine.** / 1 dose for all adults aged 74 years and older.  Meningococcal vaccine.** / Consult your health care provider.  Hepatitis A vaccine.** / Consult your health care provider.  Hepatitis B vaccine.** / Consult your health care provider.  Haemophilus influenzae type b (Hib) vaccine.** / Consult your health care provider. ** Family history and personal history of risk and conditions may change your health care provider's recommendations. Document Released: 08/07/2001 Document Revised: 04/01/2013  [facility] Patient Information 2014 [place], [place].   EXERCISE AND DIET:  We recommended that you start or continue a regular exercise program for good health. Regular exercise means any activity that makes your heart beat faster and makes you sweat.  We recommend exercising at least 30 minutes per day at least 3 days a week, preferably 5.  We also recommend a diet low in fat and sugar / carbohydrates.  Inactivity, poor dietary choices and obesity can cause diabetes, heart attack, stroke, and kidney damage, among others.     ALCOHOL AND SMOKING:  Women should limit their alcohol intake to no  more than 7 drinks/beers/glasses of wine (combined, not each!) per week. Moderation of alcohol intake to this level decreases your risk of breast cancer and liver damage.  ( And of course, no recreational drugs are part of a healthy lifestyle.)  Also, you should not be smoking at all or even being exposed to second hand smoke. Most people know smoking can  cause cancer, and various heart and lung diseases, but did you know it also contributes to weakening of your bones?  Aging of your skin?  Yellowing of your teeth and nails?   CALCIUM AND VITAMIN D:  Adequate intake of calcium and Vitamin D are recommended.  The recommendations for exact amounts of these supplements seem to change often, but generally speaking 600 mg of calcium (either carbonate or citrate) and 800 units of Vitamin D per day seems prudent. Certain women may benefit from higher intake of Vitamin D.  If you are among these women, your doctor will have told you during your visit.     PAP SMEARS:  Pap smears, to check for cervical cancer or precancers,  have traditionally been done yearly, although recent scientific advances have shown that most women can have pap smears less often.  However, every woman still should have a physical exam from her gynecologist or primary care physician every year. It will include a breast check, inspection of the vulva and vagina to check for abnormal growths or skin changes, a visual exam of the cervix, and then an exam to evaluate the size and shape of the uterus and ovaries.  And after 58 years of age, a rectal exam is indicated to check for rectal cancers. We will also provide age appropriate advice regarding health maintenance, like when you should have certain vaccines, screening for sexually transmitted diseases, bone density testing, colonoscopy, mammograms, etc.    MAMMOGRAMS:  All women over 20 years old should have a yearly mammogram. Many facilities now offer a "3D" mammogram, which may cost  around $50 extra out of pocket. If possible,  we recommend you accept the option to have the 3D mammogram performed.  It both reduces the number of women who will be called back for extra views which then turn out to be normal, and it is better than the routine mammogram at detecting truly abnormal areas.     COLONOSCOPY:  Colonoscopy to screen for colon cancer is recommended for all women at age 3.  We know, you hate the idea of the prep.  We agree, BUT, having colon cancer and not knowing it is worse!!  Colon cancer so often starts as a polyp that can be seen and removed at colonscopy, which can quite literally save your life!  And if your first colonoscopy is normal and you have no family history of colon cancer, most women don't have to have it again for 10 years.  Once every ten years, you can do something that may end up saving your life, right?  We will be happy to help you get it scheduled when you are ready.  Be sure to check your insurance coverage so you understand how much it will cost.  It may be covered as a preventative service at no cost, but you should check your particular policy.    To help reduce weight, lower blood sugar- follow diabetic diet/low salt diet and remain as active as possible. Maintain fluid intake at < 1500 mls/day= 50 ounces day. If you experience any mental status change or extreme lethargy seek immediate medical care. We will call you with lab results. If low sodium continues to be an issue- will discuss reducing venlafaxine. Follow-up in 6 weeks. Continue to social distance and wear a mask when in public. GREAT TO SEE YOU!

## 2019-03-09 NOTE — Progress Notes (Signed)
Subjective:    Patient ID: Alejandra Taylor, female    DOB: 06/23/1961, 58 y.o.   MRN: [id number]  HPI:  Ms. Pyon is here for CPE She reports AM BG 100-120s, denies episodes of hypoglycemia She reports dramatic increase in CHO/Sugar snacking- >12 lb weight gain since May 2020 Lab Results  Component Value Date   HGBA1C 7.2 (H) 03/04/2019   HGBA1C 6.8 (A) 07/01/2018   HGBA1C 6.7 (A) 03/17/2018   03/04/2019 Labs: CMP- NA+ 121 She denies any change in mentation or increase in confusion She was instructed to keep fluid intake to <50 oz/day- she reports drinking over that per day. Discussed the potential dangers of hyponatremia and the importance of fluid restriction in maintaining correct Na+ level  TSH-slightly elevated- 4.760  Free T4- WNL, 1.39  T3- slightly decreased 59  Will re-check at 6 week f/u- denies increase in overall fatigue levels- will not adjust Levothyroxine dosage- has been on this dosage for years  Nephrologist recently d/c'd Lisinopril HCTZ- now on Lisinopril 65m BID  Ambulatory BP SBP 110-130s DBP 60-70s She denies acute cardia sx's  Healthcare Maintenance: PAP-N/A, s/p complete hysterectomy  Mammogram- UTD, 09/03/2018-normal Colonoscopy- UTD, 07/28/2009 LDCT-Ordered Immunizations-Tdap and Zoster ordered  Patient Care Team    Relationship Specialty Notifications Start End  DMina MarbleD, NP PCP - General Family Medicine  04/17/17   DMelrose Nakayama MD Consulting Physician Orthopedic Surgery  04/18/17   DJuluis [facility]  08/08/17     Patient Active Problem List   Diagnosis Date Noted  . Urinary incontinence 12/11/2017  . Constipation 12/11/2017  . Need for hepatitis C screening test 12/11/2017  . Diabetes mellitus without complication ([facility] [id number] . Healthcare maintenance 06/10/2017  . Colitis 05/24/2017  . Tobacco abuse 05/24/2017  . UTI (urinary tract infection) 05/24/2017  . Depression with anxiety 05/24/2017  . AKI (acute  kidney injury) ([facility] 05/24/2017  . Hyponatremia 05/24/2017  . Migraine with status migrainosus, not intractable 04/17/2017  . Other chronic pain 04/17/2017  . Essential hypertension 04/17/2017  . PTSD (post-traumatic stress disorder) 04/17/2017  . Hypothyroidism 04/17/2017  . Hyperlipidemia 04/17/2017  . Endometrial cancer ([facility] 01/22/2012     Past Medical History:  Diagnosis Date  . Chronic back pain   . Colitis   . Diabetes mellitus   . 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 History of degenerative disc disease   . Hypercholesterolemia   . Hypertension   . Hyperthyroidism   . Migraines   . Post traumatic stress disorder   . Spinal stenosis      Past Surgical History:  Procedure Laterality Date  . ABDOMINAL HYSTERECTOMY    . CHOLECYSTECTOMY    . GANGLION CYST EXCISION     L wrist cyst removal with tendon rupture and repair  . ROBOTIC ASSISTED LAP VAGINAL HYSTERECTOMY  08/19/2009   BSO for endo ca  . TONSILLECTOMY       Family History  Problem Relation Age of Onset  . Hypertension Father   . Diabetes Father   . Heart attack Father   . Pancreatic cancer Father   . Colon cancer Maternal Aunt   . Ovarian cancer Maternal Aunt   . Ovarian cancer Paternal Grandmother   . Stroke Paternal Grandmother   . Diabetes Paternal Grandmother   . Ovarian cancer Cousin   . Breast cancer Cousin      Social History   Substance and Sexual Activity  Drug Use No     Social  History   Substance and Sexual Activity  Alcohol Use No     Social History   Tobacco Use  Smoking Status Current Every Day Smoker  . Packs/day: 1.00  . Years: 30.00  . Pack years: 30.00  . Types: Cigarettes  Smokeless Tobacco Never Used     Outpatient Encounter Medications as of 03/09/2019  Medication Sig Note  . albuterol (PROVENTIL HFA;VENTOLIN HFA) 108 (90 Base) MCG/ACT inhaler Inhale 2 puffs into the lungs every 6 (six) hours as needed for wheezing or shortness of breath.   .  clonazePAM (KLONOPIN) 1 MG tablet Take 1 mg by mouth 3 (three) times daily. 04/07/2015: Received from: External Pharmacy  . cyclobenzaprine (FLEXERIL) 10 MG tablet Take 1 tablet by mouth 4 (four) times daily as needed.   . estazolam (PROSOM) 2 MG tablet TAKE 1 TABLET AT BEDTIME AS NEEDED FOR SLEEP MAY REPEAT ONCE AS NEEDED ONLY IF MID-AWAKENING 04/07/2015: Received from: External Pharmacy  . ezetimibe (ZETIA) 10 MG tablet Take 1 tablet (10 mg total) by mouth daily.   . furosemide (LASIX) 20 MG tablet Take 1 tablet by mouth daily.   Marland Kitchen glucose blood test strip 1 each by Other route as needed. Use as instructed   . HYDROcodone-acetaminophen (NORCO) 10-325 MG tablet TAKE 1 OR 2 TABLETS BY MOUTH EVERY 6 HOURS AS NEEDED FOR PAIN MUST LAST 30 DAYS 04/07/2015: Received from: External Pharmacy  . levothyroxine (SYNTHROID) 125 MCG tablet Take 1 tablet (125 mcg total) by mouth daily.   Marland Kitchen linaclotide (LINZESS) 72 MCG capsule Take 1 capsule (72 mcg total) by mouth daily before breakfast.   . lisinopril (ZESTRIL) 20 MG tablet Take 1 tablet by mouth 2 (two) times daily.   . metFORMIN (GLUCOPHAGE) 500 MG tablet Take 1 tablet (500 mg total) by mouth 2 (two) times daily with a meal.   . NARCAN 4 MG/0.1ML LIQD nasal spray kit 1 AS NEEDED FOR OVERDOSE   . pantoprazole (PROTONIX) 40 MG tablet Take 1 tablet (40 mg total) by mouth daily.   . QUEtiapine (SEROQUEL) 300 MG tablet Take 300 mg by mouth at bedtime.   . simvastatin (ZOCOR) 40 MG tablet Take 1 tablet (40 mg total) by mouth daily at 6 PM.   . tiZANidine (ZANAFLEX) 2 MG capsule TAKE 1 CAP AS NEEDED ONCE A DAY FOR MUSCLE SPASM   . topiramate (TOPAMAX) 100 MG tablet Take 1 tablet (100 mg total) by mouth at bedtime. Max dose is 175m at bedtime   . venlafaxine XR (EFFEXOR-XR) 150 MG 24 hr capsule Take 1 capsule every morning and 1 capsule at night   . [DISCONTINUED] levothyroxine (SYNTHROID, LEVOTHROID) 125 MCG tablet Take 1 tablet (125 mcg total) by mouth daily.    . [DISCONTINUED] linaclotide (LINZESS) 72 MCG capsule Take 1 capsule (72 mcg total) by mouth daily before breakfast.   . [DISCONTINUED] metFORMIN (GLUCOPHAGE) 500 MG tablet TAKE 1 TABLET BY MOUTH EVERY DAY WITH BREAKFAST   . [DISCONTINUED] simvastatin (ZOCOR) 40 MG tablet Take 1 tablet (40 mg total) by mouth daily at 6 PM.   . [DISCONTINUED] topiramate (TOPAMAX) 100 MG tablet Take 1 tablet (100 mg total) by mouth at bedtime. Max dose is 1024mat bedtime   . [DISCONTINUED] venlafaxine XR (EFFEXOR-XR) 150 MG 24 hr capsule Take 1 capsule every morning and 1 capsule at night (Patient taking differently: Take 300 mg by mouth daily with breakfast. Take 1 capsule every morning and 1 capsule at night)   . [DISCONTINUED] venlafaxine  XR (EFFEXOR-XR) 150 MG 24 hr capsule Take 1 capsule every morning and 1 capsule at night   . Tdap (BOOSTRIX) 5-2.5-18.5 LF-MCG/0.5 injection Inject 0.5 mLs into the muscle once for 1 dose.   Marland Kitchen Zoster Vaccine Adjuvanted [facility]) injection Inject 0.5 mLs into the muscle once for 1 dose.   . [DISCONTINUED] lisinopril-hydrochlorothiazide (ZESTORETIC) 20-12.5 MG tablet TAKE 1 TABLET BY MOUTH TWICE A DAY    Facility-Administered Encounter Medications as of 03/09/2019  Medication  . ipratropium-albuterol (DUONEB) 0.5-2.5 (3) MG/3ML nebulizer solution 3 mL    Allergies: Other and Penicillins  Body mass index is 31.37 kg/m.  Blood pressure 100/67, pulse 90, temperature 98.6 F (37 C), temperature source Oral, height 5' 6.5" (1.689 m), weight 197 lb 4.8 oz (89.5 kg).  Review of Systems  Constitutional: Positive for fatigue. Negative for activity change, appetite change, chills, diaphoresis, fever and unexpected weight change.  HENT: Negative for congestion.   Eyes: Negative for visual disturbance.  Respiratory: Negative for cough, chest tightness, shortness of breath, wheezing and stridor.   Cardiovascular: Negative for chest pain, palpitations and leg swelling.   Gastrointestinal: Negative for abdominal distention, anal bleeding, blood in stool, constipation, diarrhea, nausea and vomiting.  Endocrine: Negative for cold intolerance, heat intolerance, polydipsia, polyphagia and polyuria.  Genitourinary: Negative for difficulty urinating and enuresis.  Musculoskeletal: Positive for arthralgias, back pain, gait problem, joint swelling, myalgias, neck pain and neck stiffness.  Skin: Negative for color change, pallor, rash and wound.  Neurological: Positive for headaches. Negative for dizziness, tremors and weakness.  Hematological: Negative for adenopathy. Does not bruise/bleed easily.  Psychiatric/Behavioral: Negative for agitation, behavioral problems, confusion, decreased concentration, dysphoric mood, hallucinations, self-injury, sleep disturbance and suicidal ideas. The patient is not nervous/anxious and is not hyperactive.        Objective:   Physical Exam Vitals signs and nursing note reviewed.  Constitutional:      General: She is not in acute distress.    Appearance: She is obese. She is not ill-appearing, toxic-appearing or diaphoretic.  HENT:     Head: Normocephalic and atraumatic.     Right Ear: Tympanic membrane, ear canal and external ear normal. There is no impacted cerumen.     Left Ear: Tympanic membrane, ear canal and external ear normal. There is no impacted cerumen.     Nose: Nose normal. No congestion.     Mouth/Throat:     Mouth: Mucous membranes are moist.     Pharynx: No oropharyngeal exudate.  Eyes:     Extraocular Movements: Extraocular movements intact.     Conjunctiva/sclera: Conjunctivae normal.     Pupils: Pupils are equal, round, and reactive to light.  Neck:     Musculoskeletal: Normal range of motion and neck supple.  Cardiovascular:     Rate and Rhythm: Normal rate and regular rhythm.     Pulses: Normal pulses.     Heart sounds: Normal heart sounds. No murmur. No friction rub. No gallop.   Pulmonary:      Effort: Pulmonary effort is normal. No respiratory distress.     Breath sounds: Normal breath sounds. No stridor. No wheezing, rhonchi or rales.  Chest:     Chest wall: No tenderness.     Breasts:        Right: Normal. No swelling, bleeding, inverted nipple, mass, nipple discharge, skin change or tenderness.        Left: Normal. No swelling, bleeding, inverted nipple, mass, nipple discharge, skin change or tenderness.  Abdominal:  General: Abdomen is protuberant. Bowel sounds are normal. There is no distension.     Palpations: Abdomen is soft. There is no mass.     Tenderness: There is no abdominal tenderness. There is no right CVA tenderness, left CVA tenderness, guarding or rebound. Negative signs include Murphy's sign, Rovsing's sign, McBurney's sign and psoas sign.     Hernia: No hernia is present.  Musculoskeletal: Normal range of motion.        General: No swelling.  Skin:    General: Skin is warm.     Capillary Refill: Capillary refill takes less than 2 seconds.  Neurological:     Mental Status: She is alert and oriented to person, place, and time.     Coordination: Coordination normal.  Psychiatric:        Attention and Perception: Attention and perception normal.        Mood and Affect: Mood and affect normal.        Speech: Speech normal.        Behavior: Behavior normal.        Thought Content: Thought content normal.        Cognition and Memory: Cognition and memory normal.        Judgment: Judgment normal.     Comments: No signs of mental status decline            Assessment & Plan:   1. Diabetes mellitus without complication ([place])   2. Encounter for screening for malignant neoplasm of respiratory organs   3. Need for Tdap vaccination   4. Need for zoster vaccination   5. Encounter for screening for lung cancer   6. Heavy tobacco smoker who smokes more than 10 cigarettes per day   7. Healthcare maintenance   8. Essential hypertension     Healthcare  maintenance To help reduce weight, lower blood sugar- follow diabetic diet/low salt diet and remain as active as possible. Maintain fluid intake at < 1500 mls/day= 50 ounces day. If you experience any mental status change or extreme lethargy seek immediate medical care. We will call you with lab results. If low sodium continues to be an issue- will discuss reducing venlafaxine. Follow-up in 6 weeks. Continue to social distance and wear a mask when in public.  Diabetes mellitus without complication ([place]) Lab Results  Component Value Date   HGBA1C 7.2 (H) 03/04/2019   HGBA1C 6.8 (A) 07/01/2018   HGBA1C 6.7 (A) 03/17/2018  AM BG 100-120s, denies episodes of hypoglycemia She reports increased CHO/sugar snacks 12 lb wt gain since May 2020 Currently on Metformin 566m BID  Essential hypertension Nephrology d/c'd Lisinopril HCTZ- now on Lisinopril 269mBID CMP drawn today If Na+ and K+ is still low- will reduce to QD    FOLLOW-UP:  Return in about 6 weeks (around 04/20/2019) for Regular Follow Up.

## 2019-03-09 NOTE — Assessment & Plan Note (Addendum)
Nephrology d/c'd Lisinopril HCTZ- now on Lisinopril 20mg  BID CMP drawn today If Na+ and K+ is still low- will reduce to QD

## 2019-03-10 LAB — COMPREHENSIVE METABOLIC PANEL
ALT: 49 IU/L — ABNORMAL HIGH (ref 0–32)
AST: 31 IU/L (ref 0–40)
Albumin/Globulin Ratio: 2.2 (ref 1.2–2.2)
Albumin: 4.9 g/dL (ref 3.8–4.9)
Alkaline Phosphatase: 90 IU/L (ref 39–117)
BUN/Creatinine Ratio: 11 (ref 9–23)
BUN: 10 mg/dL (ref 6–24)
Bilirubin Total: 0.4 mg/dL (ref 0.0–1.2)
CO2: 26 mmol/L (ref 20–29)
Calcium: 10.1 mg/dL (ref 8.7–10.2)
Chloride: 84 mmol/L — ABNORMAL LOW (ref 96–106)
Creatinine, Ser: 0.91 mg/dL (ref 0.57–1.00)
GFR calc Af Amer: 80 mL/min/{1.73_m2} (ref >59)
GFR calc non Af Amer: 70 mL/min/{1.73_m2} (ref >59)
Globulin, Total: 2.2 g/dL (ref 1.5–4.5)
Glucose: 182 mg/dL — ABNORMAL HIGH (ref 65–99)
Potassium: 3.4 mmol/L — ABNORMAL LOW (ref 3.5–5.2)
Sodium: 127 mmol/L — ABNORMAL LOW (ref 134–144)
Total Protein: 7.1 g/dL (ref 6.0–8.5)

## 2019-03-10 LAB — T4, FREE: Free T4: 1.39 ng/dL (ref 0.82–1.77)

## 2019-03-10 LAB — T3: T3, Total: 59 ng/dL — ABNORMAL LOW (ref 71–180)

## 2019-03-10 LAB — SPECIMEN STATUS REPORT

## 2019-03-18 ENCOUNTER — Other Ambulatory Visit: Payer: Self-pay | Admitting: Adult Health

## 2019-03-18 LAB — HM DIABETES EYE EXAM

## 2019-03-21 ENCOUNTER — Other Ambulatory Visit: Payer: Self-pay | Admitting: Adult Health

## 2019-04-21 NOTE — Progress Notes (Deleted)
Subjective:    Patient ID: Alejandra Taylor, female    DOB: September 12, 1960, 58 y.o.   MRN: [id number]  HPI:07/01/18 OV: Alejandra Taylor is here for 3 month f/u:HTN, T2D, Chronic Pain Home BP 90-110s  She denies dizziness/CP/HA She reports being on Lisinopril/HCTZ 20/12.5 BID >10 years Her original PCP started her on this due to heavy tobacco use (>1.5pack/day) and morbid obesity (>350 lbs) She stopped tobacco use >6 weeks ago and current wt 193 BP today 91/49 AM BG 110-120s, denies episodes of hypoglycemia She "pretty much follows a non-sugary diet" Due to chronic pain, is unable to exercise, however remains active with house/yardwork Followed by Pain Management monthly Nephrology 07/09/2018 11/12/2018 OV: Alejandra Taylor is connecting via WebEx today for regular f/u: HTN, T2D, Chronic Pain, Depression, PTSD AM BG 110-120s, she denies episodes of hypoglycemia She is currently taking Metformin 555m BID with meals. She reports home BP readings: SBP 120-140s, mostly high 120s, low 130s DBP 70-80s She continues to abstain from tobacco/ETOH use Due to chronic pain she is unable to engage in formal exercise, however can perform house/yard work and walk [place] [employer]. She received "radio frequency nerve burn" therapy to L lumbar back last week, will have therapy on R side next week. She received cortisone injections in L knee and R shoulder recently as well. She is closely followed by Pain Management- currently in Hydrocodone 10/[id number]- denies sedation issues. She reports stable mood, denies SI/HI  04/22/2019 OV:  [name]- Nephrology- I will stop her HCTZ and start prn Furosemide for fluid management. I have counseled her to maintain her fluid intake at < 1500 mls/day. CMP drawn today  Patient Care Team    Relationship Specialty Notifications Start End  DaMina Marble, NP PCP - General Family Medicine  04/17/17   DaMelrose NakayamaMD Consulting Physician Orthopedic  Surgery  04/18/17   [facility]  08/08/17     Patient Active Problem List   Diagnosis Date Noted  . Urinary incontinence 12/11/2017  . Constipation 12/11/2017  . Need for hepatitis C screening test 12/11/2017  . Diabetes mellitus without complication ([id number]. Healthcare maintenance 06/10/2017  . Colitis 05/24/2017  . Tobacco abuse 05/24/2017  . UTI (urinary tract infection) 05/24/2017  . Depression with anxiety 05/24/2017  . AKI (acute kidney injury) ([place]CO[place]11/30/2018  . Hyponatremia 05/24/2017  . Migraine with status migrainosus, not intractable 04/17/2017  . Other chronic pain 04/17/2017  . Essential hypertension 04/17/2017  . PTSD (post-traumatic stress disorder) 04/17/2017  . Hypothyroidism 04/17/2017  . Hyperlipidemia 04/17/2017  . Endometrial cancer (HCM[place]07/30/2013     Past Medical History:  Diagnosis Date  . Chronic back pain   . Colitis   . Diabetes mellitus   . Endometrial adenocarcinoma (HCWest Silo[facility]2011  . History of degenerative disc disease   . Hypercholesterolemia   . Hypertension   . Hyperthyroidism   . Migraines   . Post traumatic stress disorder   . Spinal stenosis      Past Surgical History:  Procedure Laterality Date  . ABDOMINAL HYSTERECTOMY    . CHOLECYSTECTOMY    . GANGLION CYST EXCISION     L wrist cyst removal with tendon rupture and repair  . ROBOTIC ASSISTED LAP VAGINAL HYSTERECTOMY  08/19/2009   BSO for endo ca  . TONSILLECTOMY       Family History  Problem Relation Age of Onset  . Hypertension Father   . Diabetes Father   .  Heart attack Father   . Pancreatic cancer Father   . Colon cancer Maternal Aunt   . Ovarian cancer Maternal Aunt   . Ovarian cancer Paternal Grandmother   . Stroke Paternal Grandmother   . Diabetes Paternal Grandmother   . Ovarian cancer Cousin   . Breast cancer Cousin      Social History   Substance and Sexual Activity  Drug Use No     Social History   Substance and Sexual  Activity  Alcohol Use No     Social History   Tobacco Use  Smoking Status Current Every Day Smoker  . Packs/day: 1.00  . Years: 30.00  . Pack years: 30.00  . Types: Cigarettes  Smokeless Tobacco Never Used     Outpatient Encounter Medications as of 04/22/2019  Medication Sig Note  . albuterol (PROVENTIL HFA;VENTOLIN HFA) 108 (90 Base) MCG/ACT inhaler Inhale 2 puffs into the lungs every 6 (six) hours as needed for wheezing or shortness of breath.   . clonazePAM (KLONOPIN) 1 MG tablet Take 1 mg by mouth 3 (three) times daily. 04/07/2015: Received from: External Pharmacy  . cyclobenzaprine (FLEXERIL) 10 MG tablet Take 1 tablet by mouth 4 (four) times daily as needed.   . estazolam (PROSOM) 2 MG tablet TAKE 1 TABLET AT BEDTIME AS NEEDED FOR SLEEP MAY REPEAT ONCE AS NEEDED ONLY IF MID-AWAKENING 04/07/2015: Received from: External Pharmacy  . ezetimibe (ZETIA) 10 MG tablet Take 1 tablet (10 mg total) by mouth daily.   . furosemide (LASIX) 20 MG tablet Take 1 tablet by mouth daily.   Marland Kitchen glucose blood test strip 1 each by Other route as needed. Use as instructed   . HYDROcodone-acetaminophen (NORCO) 10-325 MG tablet TAKE 1 OR 2 TABLETS BY MOUTH EVERY 6 HOURS AS NEEDED FOR PAIN MUST LAST 30 DAYS 04/07/2015: Received from: External Pharmacy  . levothyroxine (SYNTHROID) 125 MCG tablet Take 1 tablet (125 mcg total) by mouth daily.   Marland Kitchen linaclotide (LINZESS) 72 MCG capsule Take 1 capsule (72 mcg total) by mouth daily before breakfast.   . lisinopril (ZESTRIL) 20 MG tablet Take 1 tablet by mouth 2 (two) times daily.   . metFORMIN (GLUCOPHAGE) 500 MG tablet Take 1 tablet (500 mg total) by mouth 2 (two) times daily with a meal.   . NARCAN 4 MG/0.1ML LIQD nasal spray kit 1 AS NEEDED FOR OVERDOSE   . pantoprazole (PROTONIX) 40 MG tablet TAKE 1 TABLET BY MOUTH EVERY DAY   . QUEtiapine (SEROQUEL) 300 MG tablet Take 300 mg by mouth at bedtime.   . simvastatin (ZOCOR) 40 MG tablet Take 1 tablet (40 mg  total) by mouth daily at 6 PM.   . tiZANidine (ZANAFLEX) 2 MG capsule TAKE 1 CAP AS NEEDED ONCE A DAY FOR MUSCLE SPASM   . topiramate (TOPAMAX) 100 MG tablet Take 1 tablet (100 mg total) by mouth at bedtime. Max dose is 164m at bedtime   . venlafaxine XR (EFFEXOR-XR) 150 MG 24 hr capsule Take 1 capsule every morning and 1 capsule at night    Facility-Administered Encounter Medications as of 04/22/2019  Medication  . ipratropium-albuterol (DUONEB) 0.5-2.5 (3) MG/3ML nebulizer solution 3 mL    Allergies: Other and Penicillins  There is no height or weight on file to calculate BMI.  There were no vitals taken for this visit.     Review of Systems     Objective:   Physical Exam        Assessment & Plan:  No diagnosis found.  No problem-specific Assessment & Plan notes found for this encounter.    FOLLOW-UP:  No follow-ups on file.

## 2019-04-22 ENCOUNTER — Ambulatory Visit: Payer: BC Managed Care – PPO | Admitting: Adult Health

## 2019-06-09 ENCOUNTER — Telehealth: Payer: Self-pay

## 2019-06-09 ENCOUNTER — Other Ambulatory Visit: Payer: Self-pay | Admitting: Adult Health

## 2019-06-09 NOTE — Telephone Encounter (Signed)
Please call pt to schedule appt.  No further refills until pt is seen.  T. Allena Pietila, CMA  

## 2019-06-15 ENCOUNTER — Other Ambulatory Visit: Payer: Self-pay | Admitting: Adult Health

## 2019-07-09 ENCOUNTER — Other Ambulatory Visit: Payer: Self-pay | Admitting: Adult Health

## 2019-07-20 ENCOUNTER — Other Ambulatory Visit: Payer: Self-pay | Admitting: Adult Health

## 2019-07-22 ENCOUNTER — Other Ambulatory Visit: Payer: Self-pay | Admitting: Adult Health

## 2019-07-22 ENCOUNTER — Telehealth: Payer: Self-pay

## 2019-07-22 NOTE — Telephone Encounter (Signed)
Please call pt to schedule appt.  No further refills until pt is seen.  T. Grabiela Wohlford, CMA  

## 2019-07-24 ENCOUNTER — Telehealth: Payer: Self-pay | Admitting: Adult Health

## 2019-07-24 NOTE — Telephone Encounter (Signed)
Left patient message to contact office to set up appt.  Please call pt to schedule appt.  No further refills until pt is seen.  Charyl Bigger, [place]

## 2019-07-31 ENCOUNTER — Other Ambulatory Visit: Payer: Self-pay | Admitting: Adult Health

## 2019-08-15 ENCOUNTER — Other Ambulatory Visit: Payer: Self-pay | Admitting: Adult Health

## 2019-08-26 ENCOUNTER — Other Ambulatory Visit: Payer: Self-pay | Admitting: Adult Health

## 2019-08-29 ENCOUNTER — Other Ambulatory Visit: Payer: Self-pay | Admitting: Family Medicine

## 2019-09-21 ENCOUNTER — Ambulatory Visit (INDEPENDENT_AMBULATORY_CARE_PROVIDER_SITE_OTHER): Payer: Medicare Other | Admitting: Family Medicine

## 2019-09-21 ENCOUNTER — Other Ambulatory Visit: Payer: Self-pay

## 2019-09-21 ENCOUNTER — Encounter: Payer: Self-pay | Admitting: Family Medicine

## 2019-09-21 VITALS — BP 128/72 | HR 85 | Temp 97.5°F | Ht 67.0 in | Wt 186.2 lb

## 2019-09-21 DIAGNOSIS — F418 Other specified anxiety disorders: Secondary | ICD-10-CM

## 2019-09-21 DIAGNOSIS — E039 Hypothyroidism, unspecified: Secondary | ICD-10-CM

## 2019-09-21 DIAGNOSIS — E1159 Type 2 diabetes mellitus with other circulatory complications: Secondary | ICD-10-CM

## 2019-09-21 DIAGNOSIS — F1599 Other stimulant use, unspecified with unspecified stimulant-induced disorder: Secondary | ICD-10-CM | POA: Insufficient documentation

## 2019-09-21 DIAGNOSIS — K2970 Gastritis, unspecified, without bleeding: Secondary | ICD-10-CM

## 2019-09-21 DIAGNOSIS — N182 Chronic kidney disease, stage 2 (mild): Secondary | ICD-10-CM

## 2019-09-21 DIAGNOSIS — Z720 Tobacco use: Secondary | ICD-10-CM

## 2019-09-21 DIAGNOSIS — E1169 Type 2 diabetes mellitus with other specified complication: Secondary | ICD-10-CM

## 2019-09-21 DIAGNOSIS — I10 Essential (primary) hypertension: Secondary | ICD-10-CM

## 2019-09-21 DIAGNOSIS — K5903 Drug induced constipation: Secondary | ICD-10-CM

## 2019-09-21 DIAGNOSIS — E119 Type 2 diabetes mellitus without complications: Secondary | ICD-10-CM | POA: Diagnosis not present

## 2019-09-21 DIAGNOSIS — E1121 Type 2 diabetes mellitus with diabetic nephropathy: Secondary | ICD-10-CM

## 2019-09-21 DIAGNOSIS — K635 Polyp of colon: Secondary | ICD-10-CM

## 2019-09-21 DIAGNOSIS — R7401 Elevation of levels of liver transaminase levels: Secondary | ICD-10-CM | POA: Insufficient documentation

## 2019-09-21 DIAGNOSIS — E785 Hyperlipidemia, unspecified: Secondary | ICD-10-CM

## 2019-09-21 DIAGNOSIS — I152 Hypertension secondary to endocrine disorders: Secondary | ICD-10-CM

## 2019-09-21 DIAGNOSIS — Z1211 Encounter for screening for malignant neoplasm of colon: Secondary | ICD-10-CM

## 2019-09-21 DIAGNOSIS — E1122 Type 2 diabetes mellitus with diabetic chronic kidney disease: Secondary | ICD-10-CM | POA: Diagnosis not present

## 2019-09-21 DIAGNOSIS — E871 Hypo-osmolality and hyponatremia: Secondary | ICD-10-CM

## 2019-09-21 MED ORDER — PANTOPRAZOLE SODIUM 20 MG PO TBEC: 20.0000 mg | DELAYED_RELEASE_TABLET | Freq: Every day | ORAL | 0 refills | Status: DC

## 2019-09-21 MED ORDER — METFORMIN HCL 500 MG PO TABS: 500.0000 mg | ORAL_TABLET | Freq: Two times a day (BID) | ORAL | 0 refills | Status: DC

## 2019-09-21 MED ORDER — SIMVASTATIN 40 MG PO TABS: 40.0000 mg | ORAL_TABLET | Freq: Every day | ORAL | 0 refills | Status: DC

## 2019-09-21 MED ORDER — LINACLOTIDE 72 MCG PO CAPS: 72.0000 ug | ORAL_CAPSULE | Freq: Every day | ORAL | 0 refills | Status: DC

## 2019-09-21 MED ORDER — LEVOTHYROXINE SODIUM 125 MCG PO TABS: 125.0000 ug | ORAL_TABLET | Freq: Every day | ORAL | 0 refills | Status: DC

## 2019-09-21 MED ORDER — EZETIMIBE 10 MG PO TABS: 10.0000 mg | ORAL_TABLET | Freq: Every day | ORAL | 0 refills | Status: DC

## 2019-09-21 NOTE — Progress Notes (Signed)
Telehealth office visit note for Alejandra Taylor, D.O- at Primary Care at [facility]   I connected with current patient today and verified that I am speaking with the correct person   . Location of the patient: Home . Location of the provider: Office - This visit type was conducted due to national recommendations for restrictions regarding the COVID-19 Pandemic (e.g. social distancing) in an effort to limit this patient's exposure and mitigate transmission in our community.    - No physical exam could be performed with this format, beyond that communicated to [place] by the patient/ family members as noted.   - Additionally my office staff/ schedulers were to discuss with the patient that there may be a monetary charge related to this service, depending on their medical insurance.  My understanding is that patient understood and consented to proceed.     _________________________________________________________________________________   History of Present Illness:  I, Toni Amend, am serving as Education administrator for [employer].  - Hyponatremia & Kidney Health She went to see her nephrologist for follow-up, and as told that her sodium was borderline, and to keep an eye on it.  Notes "years ago," when she first moved to [place] and her son was 10 old, her father had very high blood pressure.  Notes it runs in the family and her mother quit cooking with salt as a result.  Patient says she doesn't put a lot of salt in foods, and feels she doesn't eat many salty foods herself due to having high blood pressure.  She used to take lisinopril-HCTZ, and this medication was split up in the past due to concerns about the patient's sodium.  Says "I think (HCTZ) was why he might be thinking my sodium was on the border, and just to keep an eye on it myself."  She's been adding salt in with food and eating more salted foods lately.  Says "we don't cook in a lot; we eat out a lot."  Her husband is  in renal failure and on dialysis, and they are in the process of trying to get him on the list for [facility].  Says they're driving to [place] often, "constantly having to test and re-test."  Her nephrologist is Dr. Neta Ehlers, through [facility].  Notes he's a very nice gentleman.  She was told she was in third stage renal failure in the past, the first time she went to nephrology.  At the time, she was taking a medication prescribed by orthopedics, which she had been taking for 3-4 years at least.  She was taken off of that due to her kidney health, and when she went back in 6 months for assessment, "my kidneys were back to normal, zero problems."  - Excessive Hydration; Drinking 1-2 liters of Diet Pepsi daily Says she eats a lot of ice and drinks out of a 40 ounce Arctic cup.   For every glass of soda, feels she also "eats a glass and a half of ice."  Believes she drinks a liter, two liters, or more of Diet Pepsi per day.  Says she's been drinking soda her whole life, and switched from regular soda to diet soda when she found out her father was diabetic.  - Chronic Pain Management Sees pain management monthly.  Confirms management with chronic narcotic use, and notes that this is the cause of her constipation.  States "I've never really had great regulated bowel movements" regardless.  Says "having the Linzess helps  it a lot."  - History of Gastritis Managed on 40 mg Protonix.  Says "I really don't know why I'm on a stomach medication.  They put me on it years ago when I was having a lot of issues with my stomach."  She had trouble with her gallbladder years ago, and notes since then, she hasn't had much of an appetite.  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  Notes she does not drink  alcohol at all.  Due to her low appetite, states she "only eats about one meal per day, and that's dinner."   HPI:  Hypertension:  Patient has no concerns regarding her blood pressure.  - Patient reports good compliance with medication and/or lifestyle modification  - Her denies acute concerns or problems related to treatment plan  - She denies new onset of: chest pain, exercise intolerance, shortness of breath, dizziness, visual changes, headache, lower extremity swelling or claudication.   Last 3 blood pressure readings in our office are as follows: BP Readings from Last 3 Encounters:  09/21/19 128/72  03/09/19 100/67  11/13/18 (!) 144/77   Filed Weights   09/21/19 1356  Weight: 186 lb 3.2 oz (84.5 kg)    HPI:   Diabetes Mellitus:  Home glucose readings:  Patient states "it's fine," 120, sometimes in 80's and 90's.  Notes she doesn't take her sugars every day, but when she does check it, it's fine.  "I haven't had any high numbers in a a while."  She avoid eating sweets; "we don't keep a lot of sweets in the house very regularly."  Says once in a while they have sweets, but most in a while they don't, "so my sugar stays pretty good."  - Patient reports good compliance with therapy plan: medication and/or lifestyle modification.  Continues her medication at morning and at night.  - Her denies acute concerns or problems related to treatment plan  - She denies new concerns.  Denies polyuria/polydipsia, hypo/ hyperglycemia symptoms.  Denies new onset of: chest pain, exercise intolerance, shortness of breath, dizziness, visual changes, headache, lower extremity swelling or claudication.   Last A1C in the office was:  Lab Results  Component Value Date   HGBA1C 7.2 (H) 03/04/2019   HGBA1C 6.8 (A) 07/01/2018   HGBA1C 6.7 (A) 03/17/2018   Lab Results  Component Value Date   MICROALBUR 80 03/09/2019   LDLCALC 55 03/04/2019   CREATININE 0.91 03/09/2019   BP Readings from  Last 3 Encounters:  09/21/19 128/72  03/09/19 100/67  11/13/18 (!) 144/77   Wt Readings from Last 3 Encounters:  09/21/19 186 lb 3.2 oz (84.5 kg)  03/09/19 197 lb 4.8 oz (89.5 kg)  11/13/18 185 lb 9.6 oz (84.2 kg)    HPI:  Hyperlipidemia:  59 y.o. female here for cholesterol follow-up.   - Patient reports good compliance with treatment plan of:  medication and/ or lifestyle management.  Continues Zocor and Zetia.  - Patient denies any acute concerns or problems with management plan   - She denies new onset of: myalgias, arthralgias, increased fatigue more than normal, chest pains, exercise intolerance, shortness of breath, dizziness, visual changes, headache, lower extremity swelling or claudication.   Most recent cholesterol panel was:  Lab Results  Component Value Date   CHOL 159  03/04/2019   HDL 60 03/04/2019   LDLCALC 55 03/04/2019   TRIG 286 (H) 03/04/2019   CHOLHDL 2.7 03/04/2019   Hepatic Function Latest Ref Rng & Units 03/09/2019 03/04/2019 03/17/2018  Total Protein 6.0 - 8.5 g/dL 7.1 6.9 6.6  Albumin 3.8 - 4.9 g/dL 4.9 4.5 4.4  AST 0 - 40 IU/L '31 15 14  ' ALT 0 - 32 IU/L 49(H) 11 19  Alk Phosphatase 39 - 117 IU/L 90 76 100  Total Bilirubin 0.0 - 1.2 mg/dL 0.4 0.2 0.2  Bilirubin, Direct - - - -     GAD 7 : Generalized Anxiety Score 09/21/2019  Nervous, Anxious, on Edge 0  Control/stop worrying 0  Worry too much - different things 0  Trouble relaxing 0  Restless 0  Easily annoyed or irritable 0  Afraid - awful might happen 1  Total GAD 7 Score 1  Anxiety Difficulty Not difficult at all    Depression screen [facility] 2/9 09/21/2019 03/09/2019 11/13/2018 08/06/2018 07/01/2018  Decreased Interest 0 2 0 0 2  Down, Depressed, Hopeless '3 1 2 ' 0 2  PHQ - 2 Score '3 3 2 ' 0 4  Altered sleeping 0 2 1 - 1  Tired, decreased energy '3 3 3 ' - 2  Change in appetite '3 2 1 ' - 2  Feeling bad or failure about yourself  '3 3 3 ' - 1  Trouble concentrating 0 1 0 - 0  Moving slowly or  fidgety/restless '1 2 3 ' - 1  Suicidal thoughts 0 0 0 - 0  PHQ-9 Score '13 16 13 ' - 11  Difficult doing work/chores Somewhat difficult Very difficult Very difficult - Very difficult  Some recent data might be hidden      Impression and Recommendations:     1. Diabetes mellitus without complication ([place])   2. Type 2 diabetes mellitus with stage 2 chronic kidney disease, without long-term current use of insulin ([place])   3. Hypertension associated with diabetes ([place])   4. Diabetic nephropathy associated with type 2 diabetes mellitus ([place])   5. Hyperlipidemia associated with type 2 diabetes mellitus ([place])   6. Tobacco abuse   7. Depression with anxiety   8. Constipation due to pain medication therapy   9. Gastritis without bleeding, unspecified chronicity, unspecified gastritis type   10. Hypothyroidism, unspecified type   11. Hyperplastic polyp of cecum   12. Screening for colon cancer   13. Elevated alanine aminotransferase (ALT) level   14. 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  Patient is new to me and was previously being cared for at our office by Mina Marble, NP, who no longer works at primary care [employer].   Hyperplastic polyp of cecum, screening for colon cancer - Last colonoscopy obtained 2011; due February 2021. - Order for colonoscopy/ambulatory referral to gastroenterology placed today.   Hyponatremia - Reviewed that patient's sodium was last measured at 127, down from 132 one year ago, and 137 two years ago.  - Advised patient to decrease her high intake of liquids.  - Advised patient to not drink any more than half of her weight in ounces of liquid per day; and actually advised to decrease her liquid intake beyond that  - Will continue to monitor.   CKD, Stage II; Diabetic Nephropathy  - Advised patient to wean down on her high intake of diet soda. (Which has been 2 L/day for decades  per  patient) advised patient this is not healthy for her kidneys nor her rest of her body.   Also explained that even though there is not actual sugar in the diet soda, this absolutely can increase her blood sugars with the sugar supplements.  - To help maintain kidney health, advised patient to maintain controlled blood pressure, controlled blood sugar, adequate hydration, physical activity, and avoiding nephrotoxic substances.  - Will continue to monitor.   Diabetes mellitus without complication - I7P six months ago was 7.2, elevated, up from 6.8 prior. - Discussed goal A1c of 7.0.  Will need re-check near future.  - Per patient, blood sugars are controlled at home. - Pt will continue current treatment regimen.  See med list.  - Counseled patient on pathophysiology of disease and discussed various treatment options, which always includes dietary and lifestyle modification as first line.    - Importance of low carb, heart-healthy diet discussed with patient in addition to regular aerobic exercise of 48mn 5d/week or more.   - Check FBS and 2 hours after the biggest meal of your day.  Keep log and bring in next OV for my review.     - Also told patient if you ever feel poorly, please check your blood pressure and blood sugar, as one or the other could be the cause of your symptoms.  - Pt reminded about need for yearly eye and foot exams.  Told patient to make appt.for diabetic eye exam, CMAs here will do foot exams  - We will continue to monitor and re-check as discussed.   Hypertension associated with diabetes - Blood pressure currently is stable, at goal. - Patient will continue current treatment regimen.  See med list.  - Counseled patient on pathophysiology of disease and discussed various treatment options, which always includes dietary and lifestyle modification as first line.   - Lifestyle changes such as dash and heart healthy diets and engaging in a regular exercise program  discussed extensively with patient.   - Ambulatory blood pressure monitoring encouraged at least 3 times weekly.  Keep log and bring in every office visit.  Reminded patient that if they ever feel poorly in any way, to check their blood pressure and pulse.  - We will continue to monitor.   Hyperlipidemia associated with type 2 diabetes mellitus - Last FLP obtained 6 months ago: Triglycerides = 286, elevated, up from 115 two years prior. HDL = 60, down from 80 two years prior. LDL = 55, stable.  - Pt will continue current treatment regimen.  See med list.  - Dietary changes such as low saturated & trans fat diets for hyperlipidemia and low carb diets for hypertriglyceridemia discussed with patient.    - Encouraged patient to follow AHA guidelines for regular exercise and also engage in weight loss if BMI above 25.   - We will continue to monitor and re-check as discussed.   Constipation due to pain medication therapy - Stable at this time. -Sees Dr. SVira Blancofor chronic pain management-on chronic narcotics daily - Per patient, symptoms well-controlled on Linzess. - Continue current treatment plan as established.  See med list.  - Will continue to monitor.   Gastritis w/out bleeding - Advised patient that long-term chronic use of higher dose Protonix may lead to memory problems. - Dose of Protonix lowered to 20 from 40 mg today.  - Advised patient to take lowest effective dose, or not at all if she does not need to take it.  -  Will continue to monitor.   - As part of my medical decision making, I reviewed the following data within the [place] History obtained from pt /family, CMA notes reviewed and incorporated if applicable, Labs reviewed, Radiograph/ tests reviewed if applicable and OV notes from prior OV's with me, as well as other specialists she/he has seen since seeing me last, were all reviewed and used in my medical decision making process today.    -  Additionally, when appropriate, discussion had with patient regarding our treatment plan, and their biases/concerns about that plan were used in my medical decision making today.    - The patient agreed with the plan and demonstrated an understanding of the instructions.   No barriers to understanding were identified.     - The patient was advised to call back or seek an in-person evaluation if the symptoms worsen or if the condition fails to improve as anticipated.   Return for DM, HTN, HLD follow up every 23mo    -Per patient she is coming in at 3 PM today to get these labs drawn: Orders Placed This Encounter  Procedures  . T4, free  . TSH  . T3, free  . T3 Uptake  . Comprehensive metabolic panel  . Hemoglobin A1c  . Ambulatory referral to Gastroenterology    Meds ordered this encounter  Medications  . ezetimibe (ZETIA) 10 MG tablet    Sig: Take 1 tablet (10 mg total) by mouth daily.    Dispense:  90 tablet    Refill:  0  . levothyroxine (SYNTHROID) 125 MCG tablet    Sig: Take 1 tablet (125 mcg total) by mouth daily.    Dispense:  90 tablet    Refill:  0  . linaclotide (LINZESS) 72 MCG capsule    Sig: Take 1 capsule (72 mcg total) by mouth daily before breakfast.    Dispense:  90 capsule    Refill:  0  . metFORMIN (GLUCOPHAGE) 500 MG tablet    Sig: Take 1 tablet (500 mg total) by mouth 2 (two) times daily with a meal.    Dispense:  180 tablet    Refill:  0  . pantoprazole (PROTONIX) 20 MG tablet    Sig: Take 1 tablet (20 mg total) by mouth daily.    Dispense:  90 tablet    Refill:  0  . simvastatin (ZOCOR) 40 MG tablet    Sig: Take 1 tablet (40 mg total) by mouth daily at 6 PM.    Dispense:  90 tablet    Refill:  0    Medications Discontinued During This Encounter  Medication Reason  . levothyroxine (SYNTHROID) 125 MCG tablet Reorder  . pantoprazole (PROTONIX) 40 MG tablet Reorder  . ezetimibe (ZETIA) 10 MG tablet Reorder  . simvastatin (ZOCOR) 40 MG tablet  Reorder  . linaclotide (LINZESS) 72 MCG capsule Reorder  . metFORMIN (GLUCOPHAGE) 500 MG tablet Reorder     Time spent on visit including pre-visit chart review and post-visit care was 29 minutes.   Note:  This note was prepared with assistance of Dragon voice recognition software. Occasional wrong-word or sound-a-like substitutions may have occurred due to the inherent limitations of voice recognition software.  The [place] signed into law in 2016 which includes the topic of electronic health records.  This provides immediate access to information in MyChart.  This includes consultation notes, operative notes, office notes, lab results and pathology reports.  If you  have any questions about what you read please let us know at your next visit or call us at the office.  We are right here with you.  This document serves as a record of services personally performed by Alejandra Dance, DO. It was created on her behalf by Toni Amend, a trained medical scribe. The creation of this record is based on the scribe's personal observations and the provider's statements to them.    The above documentation from Toni Amend, medical scribe, has been reviewed by Marjory Sneddon, D.O. __________________________________________________________________________________     Patient Care Team    Relationship Specialty Notifications Start End  Esaw Grandchild, NP PCP - General Family Medicine  04/17/17   Melrose Nakayama, MD Consulting Physician Orthopedic Surgery  04/18/17   Juluis Rainier  Optometry  08/08/17   Dian Situ, MD  Pain Medicine  09/21/19   Tracie Harrier, MD Consulting Physician Nephrology  09/21/19     -Vitals obtained; medications/ allergies reconciled;  personal medical, social, Sx etc.histories were updated by CMA, reviewed by me and are reflected in chart   Patient Active Problem List   Diagnosis Date Noted  . Diabetes mellitus without  complication ([place]) 09/62/8366  . Hypertension associated with diabetes ([place]) 04/17/2017  . Hyperlipidemia associated with type 2 diabetes mellitus ([place]) 04/17/2017  . Tobacco abuse 05/24/2017  . Depression with anxiety 05/24/2017  . Hypothyroidism 04/17/2017  . Endometrial cancer ([place]) 01/22/2012  . AKI (acute kidney injury) ([place]) 05/24/2017  . Hyponatremia 05/24/2017  . Migraine with status migrainosus, not intractable 04/17/2017  . Diabetic nephropathy associated with type 2 diabetes mellitus ([place]) 09/21/2019  . CKD (chronic kidney disease), stage II 09/21/2019  . Gastritis without bleeding 09/21/2019  . Elevated alanine aminotransferase (ALT) level 09/21/2019  . Hyperplastic polyp of cecum 09/21/2019  . 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 Urinary incontinence 12/11/2017  . Constipation due to pain medication therapy 12/11/2017  . Need for hepatitis C screening test 12/11/2017  . Healthcare maintenance 06/10/2017  . Colitis 05/24/2017  . UTI (urinary tract infection) 05/24/2017  . Other chronic pain 04/17/2017  . PTSD (post-traumatic stress disorder) 04/17/2017     Current Meds  Medication Sig  . albuterol (PROVENTIL HFA;VENTOLIN HFA) 108 (90 Base) MCG/ACT inhaler Inhale 2 puffs into the lungs every 6 (six) hours as needed for wheezing or shortness of breath.  . clonazePAM (KLONOPIN) 1 MG tablet Take 1 mg by mouth 3 (three) times daily.  . cyclobenzaprine (FLEXERIL) 10 MG tablet Take 1 tablet by mouth 4 (four) times daily as needed.  . estazolam (PROSOM) 2 MG tablet TAKE 1 TABLET AT BEDTIME AS NEEDED FOR SLEEP MAY REPEAT ONCE AS NEEDED ONLY IF MID-AWAKENING  . ezetimibe (ZETIA) 10 MG tablet Take 1 tablet (10 mg total) by mouth daily.  . furosemide (LASIX) 20 MG tablet Take 1 tablet by mouth daily.  Marland Kitchen HYDROcodone-acetaminophen (NORCO) 10-325 MG tablet TAKE 1 OR 2 TABLETS BY MOUTH EVERY 6 HOURS AS NEEDED FOR PAIN MUST LAST 30 DAYS  . levothyroxine (SYNTHROID) 125 MCG  tablet Take 1 tablet (125 mcg total) by mouth daily.  Marland Kitchen linaclotide (LINZESS) 72 MCG capsule Take 1 capsule (72 mcg total) by mouth daily before breakfast.  . lisinopril (ZESTRIL) 20 MG tablet Take 1 tablet by mouth 2 (two) times daily.  . metFORMIN (GLUCOPHAGE) 500 MG tablet Take 1 tablet (500 mg total) by mouth 2 (two) times daily with a meal.  . NARCAN 4 MG/0.1ML LIQD nasal  spray kit 1 AS NEEDED FOR OVERDOSE  . pantoprazole (PROTONIX) 20 MG tablet Take 1 tablet (20 mg total) by mouth daily.  . QUEtiapine (SEROQUEL) 300 MG tablet Take 300 mg by mouth at bedtime.  . simvastatin (ZOCOR) 40 MG tablet Take 1 tablet (40 mg total) by mouth daily at 6 PM.  . topiramate (TOPAMAX) 100 MG tablet Take 1 tablet (100 mg total) by mouth at bedtime. Max dose is 17m at bedtime  . venlafaxine XR (EFFEXOR-XR) 150 MG 24 hr capsule Take 1 capsule every morning and 1 capsule at night  . [DISCONTINUED] ezetimibe (ZETIA) 10 MG tablet Take 1 tablet (10 mg total) by mouth daily. **PATIENT NEEDS APT FOR ANY FURTHER REFILLS**  . [DISCONTINUED] levothyroxine (SYNTHROID) 125 MCG tablet TAKE 1 TABLET (125 MCG TOTAL) BY MOUTH DAILY. OFFICE VISIT REQUIRED PRIOR TO ANY FURTHER REFILLS  . [DISCONTINUED] linaclotide (LINZESS) 72 MCG capsule Take 1 capsule (72 mcg total) by mouth daily before breakfast. **PATIENT NEEDS APT FOR ANY FURTHER REFILLS**  . [DISCONTINUED] metFORMIN (GLUCOPHAGE) 500 MG tablet Take 1 tablet (500 mg total) by mouth 2 (two) times daily with a meal. **PATIENT NEEDS APT FOR ANY FURTHER REFILLS**  . [DISCONTINUED] pantoprazole (PROTONIX) 40 MG tablet TAKE 1 TABLET (40 MG TOTAL) BY MOUTH DAILY. OFFICE VISIT REQUIRED PRIOR TO ANY FURTHER REFILLS  . [DISCONTINUED] simvastatin (ZOCOR) 40 MG tablet Take 1 tablet (40 mg total) by mouth daily at 6 PM. **PATIENT NEEDS APT FOR ANY FURTHER REFILLS**   Current Facility-Administered Medications for the 09/21/19 encounter (Office Visit) with OMellody Dance DO    Medication  . ipratropium-albuterol (DUONEB) 0.5-2.5 (3) MG/3ML nebulizer solution 3 mL     Allergies:  Allergies  Allergen Reactions  . Other Hives    neoprene  . Penicillins Rash     ROS:  See above HPI for pertinent positives and negatives   Objective:   Blood pressure 128/72, pulse 85, temperature (!) 97.5 F (36.4 C), temperature source Oral, height '5\' 7"'  (1.702 m), weight 186 lb 3.2 oz (84.5 kg).  (if some vitals are omitted, this means that patient was UNABLE to obtain them even though they were asked to get them prior to OV today.  They were asked to call [place] their earliest convenience with these once obtained. ) General: A & O * 3; sounds in no acute distress; in usual state of health.  Skin: Pt confirms warm and dry extremities and pink fingertips HEENT: Pt confirms lips non-cyanotic Chest: Patient confirms normal chest excursion and movement Respiratory: speaking in full sentences, no conversational dyspnea; patient confirms no use of accessory muscles Psych: insight appears good, mood- appears full

## 2019-09-22 ENCOUNTER — Other Ambulatory Visit: Payer: Self-pay | Admitting: Family Medicine

## 2019-09-22 DIAGNOSIS — E119 Type 2 diabetes mellitus without complications: Secondary | ICD-10-CM

## 2019-09-22 LAB — COMPREHENSIVE METABOLIC PANEL
ALT: 9 IU/L (ref 0–32)
AST: 10 IU/L (ref 0–40)
Albumin/Globulin Ratio: 1.6 (ref 1.2–2.2)
Albumin: 4.2 g/dL (ref 3.8–4.9)
Alkaline Phosphatase: 76 IU/L (ref 39–117)
BUN/Creatinine Ratio: 10 (ref 9–23)
BUN: 8 mg/dL (ref 6–24)
Bilirubin Total: <0.2 mg/dL (ref 0.0–1.2)
CO2: 23 mmol/L (ref 20–29)
Calcium: 9.8 mg/dL (ref 8.7–10.2)
Chloride: 95 mmol/L — ABNORMAL LOW (ref 96–106)
Creatinine, Ser: 0.84 mg/dL (ref 0.57–1.00)
GFR calc Af Amer: 88 mL/min/{1.73_m2} (ref >59)
GFR calc non Af Amer: 76 mL/min/{1.73_m2} (ref >59)
Globulin, Total: 2.7 g/dL (ref 1.5–4.5)
Glucose: 173 mg/dL — ABNORMAL HIGH (ref 65–99)
Potassium: 3.7 mmol/L (ref 3.5–5.2)
Sodium: 131 mmol/L — ABNORMAL LOW (ref 134–144)
Total Protein: 6.9 g/dL (ref 6.0–8.5)

## 2019-09-22 LAB — T3 UPTAKE: T3 Uptake Ratio: 27 % (ref 24–39)

## 2019-09-22 LAB — T3, FREE: T3, Free: 1.9 pg/mL — ABNORMAL LOW (ref 2.0–4.4)

## 2019-09-22 LAB — TSH: TSH: 0.707 u[IU]/mL (ref 0.450–4.500)

## 2019-09-22 LAB — HEMOGLOBIN A1C
Est. average glucose Bld gHb Est-mCnc: 171 mg/dL
Hgb A1c MFr Bld: 7.6 % — ABNORMAL HIGH (ref 4.8–5.6)

## 2019-09-22 LAB — T4, FREE: Free T4: 0.96 ng/dL (ref 0.82–1.77)

## 2019-09-22 MED ORDER — METFORMIN HCL 1000 MG PO TABS: 1000.0000 mg | ORAL_TABLET | Freq: Two times a day (BID) | ORAL | 2 refills | Status: DC

## 2019-11-18 ENCOUNTER — Other Ambulatory Visit: Payer: Self-pay

## 2019-11-18 ENCOUNTER — Encounter: Payer: Self-pay | Admitting: Physician Assistant

## 2019-11-18 ENCOUNTER — Ambulatory Visit (INDEPENDENT_AMBULATORY_CARE_PROVIDER_SITE_OTHER): Payer: Medicare Other | Admitting: Physician Assistant

## 2019-11-18 VITALS — BP 136/85 | HR 105 | Temp 98.0°F | Ht 67.0 in | Wt 179.0 lb

## 2019-11-18 DIAGNOSIS — E1122 Type 2 diabetes mellitus with diabetic chronic kidney disease: Secondary | ICD-10-CM

## 2019-11-18 DIAGNOSIS — E119 Type 2 diabetes mellitus without complications: Secondary | ICD-10-CM | POA: Diagnosis not present

## 2019-11-18 DIAGNOSIS — E1169 Type 2 diabetes mellitus with other specified complication: Secondary | ICD-10-CM

## 2019-11-18 DIAGNOSIS — N182 Chronic kidney disease, stage 2 (mild): Secondary | ICD-10-CM

## 2019-11-18 DIAGNOSIS — I10 Essential (primary) hypertension: Secondary | ICD-10-CM

## 2019-11-18 DIAGNOSIS — I152 Hypertension secondary to endocrine disorders: Secondary | ICD-10-CM

## 2019-11-18 DIAGNOSIS — E785 Hyperlipidemia, unspecified: Secondary | ICD-10-CM

## 2019-11-18 DIAGNOSIS — E1159 Type 2 diabetes mellitus with other circulatory complications: Secondary | ICD-10-CM | POA: Diagnosis not present

## 2019-11-18 DIAGNOSIS — E1121 Type 2 diabetes mellitus with diabetic nephropathy: Secondary | ICD-10-CM

## 2019-11-18 DIAGNOSIS — L700 Acne vulgaris: Secondary | ICD-10-CM

## 2019-11-18 NOTE — Progress Notes (Signed)
Established Patient Office Visit  Subjective:  Patient ID: Alejandra Taylor, female    DOB: 12-Jun-1961  Age: 59 y.o. MRN: [id number]  CC:  Chief Complaint  Patient presents with  . Diabetes  . Hyperlipidemia  . Hypertension    HPI Alejandra Taylor presents for 2 month follow up on T2DM, hypertension, and hyperlipidemia.  Diabetes: Pt denies increased urination or thirst. Pt reports medication compliance. No hypoglycemic events. Checking glucose at home. FBS range 90-113. She has made dietary changes and reduced sweets including sodas. She has lost 7 pounds since her last OV (03/21).   HTN: Pt denies chest pain, palpitations, dizziness or lower extremity swelling. Taking medication as directed without side effects. Checks BP at home occasionally. States she gets a headache when her blood pressure is elevated. Pt follows a low salt diet.  HLD: Pt taking medication as directed without issues. Denies side effects including myalgias and RUQ pain.   Skin lesion underneath R breast: She has a non-tender skin bump underneath her R breast she wants looked at.  Past Medical History:  Diagnosis Date  . Chronic back pain   . Colitis   . Diabetes mellitus   . 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 History of degenerative disc disease   . Hypercholesterolemia   . Hypertension   . Hyperthyroidism   . Migraines   . Post traumatic stress disorder   . Spinal stenosis     Past Surgical History:  Procedure Laterality Date  . ABDOMINAL HYSTERECTOMY    . CHOLECYSTECTOMY    . GANGLION CYST EXCISION     L wrist cyst removal with tendon rupture and repair  . ROBOTIC ASSISTED LAP VAGINAL HYSTERECTOMY  08/19/2009   BSO for endo ca  . TONSILLECTOMY      Family History  Problem Relation Age of Onset  . Hypertension Father   . Diabetes Father   . Heart attack Father   . Pancreatic cancer Father   . Colon cancer Maternal Aunt   . Ovarian cancer Maternal Aunt   . Ovarian cancer Paternal  Grandmother   . Stroke Paternal Grandmother   . Diabetes Paternal Grandmother   . Ovarian cancer Cousin   . Breast cancer Cousin     Social History   Socioeconomic History  . Marital status: Married    Spouse name: Not on file  . Number of children: 1  . Years of education: Not on file  . Highest education level: Not on file  Occupational History  . Occupation: Disabled  Tobacco Use  . Smoking status: Current Every Day Smoker    Packs/day: 1.00    Years: 30.00    Pack years: 30.00    Types: Cigarettes  . Smokeless tobacco: Never Used  Substance and Sexual Activity  . Alcohol use: No  . Drug use: No  . Sexual activity: Not Currently  Other Topics Concern  . Not on file  Social History Narrative  . Not on file   Social Determinants of Health   Financial Resource Strain:   . Difficulty of Paying Living Expenses:   Food Insecurity:   . Worried About Charity fundraiser in the Last Year:   . Arboriculturist in the Last Year:   Transportation Needs:   . Film/video editor (Medical):   Marland Kitchen Lack of Transportation (Non-Medical):   Physical Activity:   . Days of Exercise per Week:   . Minutes of Exercise per Session:   Stress:   .  Feeling of Stress :   Social Connections:   . Frequency of Communication with Friends and Family:   . Frequency of Social Gatherings with Friends and Family:   . Attends Religious Services:   . Active Member of Clubs or Organizations:   . Attends Archivist Meetings:   Marland Kitchen Marital Status:   Intimate Partner Violence:   . Fear of Current or Ex-Partner:   . Emotionally Abused:   Marland Kitchen Physically Abused:   . Sexually Abused:     Outpatient Medications Prior to Visit  Medication Sig Dispense Refill  . albuterol (PROVENTIL HFA;VENTOLIN HFA) 108 (90 Base) MCG/ACT inhaler Inhale 2 puffs into the lungs every 6 (six) hours as needed for wheezing or shortness of breath. 1 Inhaler 3  . clonazePAM (KLONOPIN) 1 MG tablet Take 1 mg by mouth 3  (three) times daily.  1  . cyclobenzaprine (FLEXERIL) 10 MG tablet Take 1 tablet by mouth 4 (four) times daily as needed.    . estazolam (PROSOM) 2 MG tablet TAKE 1 TABLET AT BEDTIME AS NEEDED FOR SLEEP MAY REPEAT ONCE AS NEEDED ONLY IF MID-AWAKENING  5  . ezetimibe (ZETIA) 10 MG tablet Take 1 tablet (10 mg total) by mouth daily. 90 tablet 0  . furosemide (LASIX) 20 MG tablet Take 1 tablet by mouth daily.    Marland Kitchen glucose blood test strip 1 each by Other route as needed. Use as instructed    . HYDROcodone-acetaminophen (NORCO) 10-325 MG tablet TAKE 1 OR 2 TABLETS BY MOUTH EVERY 6 HOURS AS NEEDED FOR PAIN MUST LAST 30 DAYS  0  . levothyroxine (SYNTHROID) 125 MCG tablet Take 1 tablet (125 mcg total) by mouth daily. 90 tablet 0  . linaclotide (LINZESS) 72 MCG capsule Take 1 capsule (72 mcg total) by mouth daily before breakfast. 90 capsule 0  . lisinopril (ZESTRIL) 20 MG tablet Take 1 tablet by mouth 2 (two) times daily.    . metFORMIN (GLUCOPHAGE) 1000 MG tablet Take 1 tablet (1,000 mg total) by mouth 2 (two) times daily with a meal. (needs ov b-4 RF) 60 tablet 2  . NARCAN 4 MG/0.1ML LIQD nasal spray kit 1 AS NEEDED FOR OVERDOSE  0  . pantoprazole (PROTONIX) 20 MG tablet Take 1 tablet (20 mg total) by mouth daily. 90 tablet 0  . QUEtiapine (SEROQUEL) 300 MG tablet Take 300 mg by mouth at bedtime.    . simvastatin (ZOCOR) 40 MG tablet Take 1 tablet (40 mg total) by mouth daily at 6 PM. 90 tablet 0  . tiZANidine (ZANAFLEX) 2 MG capsule TAKE 1 CAP AS NEEDED ONCE A DAY FOR MUSCLE SPASM 10 capsule 0  . topiramate (TOPAMAX) 100 MG tablet Take 1 tablet (100 mg total) by mouth at bedtime. Max dose is 17m at bedtime 180 tablet 1  . venlafaxine XR (EFFEXOR-XR) 150 MG 24 hr capsule Take 1 capsule every morning and 1 capsule at night 120 capsule 0   Facility-Administered Medications Prior to Visit  Medication Dose Route Frequency Provider Last Rate Last Admin  . ipratropium-albuterol (DUONEB) 0.5-2.5 (3) MG/3ML  nebulizer solution 3 mL  3 mL Nebulization Q6H Danford, Katy D, NP   3 mL at 06/20/18 1041    Allergies  Allergen Reactions  . Other Hives    neoprene  . Penicillins Rash    ROS Review of Systems  A fourteen system review of systems was performed and found to be positive as per HPI.   Objective:    Physical  Exam General: Well nourished, in no apparent distress. Eyes: PERRLA, EOMs, conjunctiva clr Resp: Respiratory effort- normal, ECTA B/L w/o W/R/R  Cardio: RRR w/o MRGs. Abdomen: no gross distention. Lymphatics:  less 2 sec cap RF M-sk: Full ROM, 5/5 strength, normal gait.  Skin: Warm, dry. Large blackhead underneath R breast present.  Neuro: Alert, Oriented Psych: Normal affect, Insight and Judgment appropriate.   BP 136/85   Pulse (!) 105   Temp 98 F (36.7 C) (Oral)   Ht '5\' 7"'  (1.702 m)   Wt 179 lb (81.2 kg)   BMI 28.04 kg/m  Wt Readings from Last 3 Encounters:  11/18/19 179 lb (81.2 kg)  09/21/19 186 lb 3.2 oz (84.5 kg)  03/09/19 197 lb 4.8 oz (89.5 kg)     Health Maintenance Due  Topic Date Due  . PNEUMOCOCCAL POLYSACCHARIDE VACCINE AGE 64-64 HIGH RISK  Never done  . PAP SMEAR-Modifier  04/06/2018  . TETANUS/TDAP  08/06/2018  . COLONOSCOPY  07/29/2019    There are no preventive care reminders to display for this patient.  Lab Results  Component Value Date   TSH 0.707 09/21/2019   Lab Results  Component Value Date   WBC 9.9 03/04/2019   HGB 14.8 03/04/2019   HCT 40.8 03/04/2019   MCV 93 03/04/2019   PLT 323 03/04/2019   Lab Results  Component Value Date   NA 131 (L) 09/21/2019   K 3.7 09/21/2019   CO2 23 09/21/2019   GLUCOSE 173 (H) 09/21/2019   BUN 8 09/21/2019   CREATININE 0.84 09/21/2019   BILITOT <0.2 09/21/2019   ALKPHOS 76 09/21/2019   AST 10 09/21/2019   ALT 9 09/21/2019   PROT 6.9 09/21/2019   ALBUMIN 4.2 09/21/2019   CALCIUM 9.8 09/21/2019   ANIONGAP 6 05/30/2017   Lab Results  Component Value Date   CHOL 159 03/04/2019    Lab Results  Component Value Date   HDL 60 03/04/2019   Lab Results  Component Value Date   LDLCALC 55 03/04/2019   Lab Results  Component Value Date   TRIG 286 (H) 03/04/2019   Lab Results  Component Value Date   CHOLHDL 2.7 03/04/2019   Lab Results  Component Value Date   HGBA1C 7.6 (H) 09/21/2019      Assessment & Plan:   Problem List Items Addressed This Visit      Cardiovascular and Mediastinum   Hypertension associated with diabetes ([place]) (Chronic)     Endocrine   Hyperlipidemia associated with type 2 diabetes mellitus (H[facility]) (Chronic)   Diabetes mellitus without complication ([place]) - Primary   Diabetic nephropathy associated with type 2 diabetes mellitus ([place])    Other Visit Diagnoses    Type 2 diabetes mellitus with stage 2 chronic kidney disease, without long-term current use of insulin ([place])       [place]         Diabetes: - Most recent A1c 7.6, too soon to recheck - Continue Metformin. Plan to recheck A1c at next OV and if it continues to be above goal, then will consider adding another medication to current regimen. - Encourage patient to continue dietary changes with reducing carbohydrates and glucose, and to stay as active as possible. Weight loss will also help with improving diabetes.  -Continue ambulatory glucose monitoring and notify clinic if FBS consistently <80 or >160.  Diabetic Nephropathy, CKD stage II: - Followed by Nephrology - Most recent CMP- creatinine 0.84, eGFR 76  HTN: - BP today is  136/85, stable. She hasn't taken her BP medication yet. - Followed by Nephrology - Continue Lisinopril daily and Lasix prn - Encourage ambulatory BP and pulse monitoring.  - Continue DASH diet.  HLD: - Last lipid panel wnl's with exception of elevated triglycerides. - Continue Simvastatin 40 mg. - Follow heart healthy diet.  - Plan to recheck lipid panel and hepatic function at next OV.  [place]: - Reassurance provided it's a benign skin  lesion and appearance is consistent with blackhead.    No orders of the defined types were placed in this encounter.   Follow-up: Return in about 3 months (around 02/18/2020) for DM, HTN, HLD, hypothyroid.    Lorrene Reid, PA-C

## 2019-11-18 NOTE — Patient Instructions (Signed)

## 2019-11-20 ENCOUNTER — Encounter: Payer: Self-pay | Admitting: Family Medicine

## 2019-12-02 LAB — HM MAMMOGRAPHY

## 2019-12-10 ENCOUNTER — Encounter: Payer: Self-pay | Admitting: Physician Assistant

## 2019-12-16 LAB — HM MAMMOGRAPHY

## 2019-12-17 ENCOUNTER — Encounter: Payer: Self-pay | Admitting: Physician Assistant

## 2019-12-17 ENCOUNTER — Other Ambulatory Visit: Payer: Self-pay | Admitting: Physician Assistant

## 2019-12-17 DIAGNOSIS — E1169 Type 2 diabetes mellitus with other specified complication: Secondary | ICD-10-CM

## 2019-12-17 MED ORDER — SIMVASTATIN 40 MG PO TABS: 40.0000 mg | ORAL_TABLET | Freq: Every day | ORAL | 0 refills | Status: DC

## 2020-01-01 ENCOUNTER — Other Ambulatory Visit: Payer: Self-pay | Admitting: Family Medicine

## 2020-01-01 DIAGNOSIS — K5903 Drug induced constipation: Secondary | ICD-10-CM

## 2020-01-01 DIAGNOSIS — K2970 Gastritis, unspecified, without bleeding: Secondary | ICD-10-CM

## 2020-01-01 DIAGNOSIS — E039 Hypothyroidism, unspecified: Secondary | ICD-10-CM

## 2020-01-01 DIAGNOSIS — E119 Type 2 diabetes mellitus without complications: Secondary | ICD-10-CM

## 2020-01-01 DIAGNOSIS — E1169 Type 2 diabetes mellitus with other specified complication: Secondary | ICD-10-CM

## 2020-01-11 ENCOUNTER — Other Ambulatory Visit: Payer: Self-pay | Admitting: Radiology

## 2020-01-11 LAB — HM MAMMOGRAPHY

## 2020-01-20 ENCOUNTER — Encounter: Payer: Self-pay | Admitting: Physician Assistant

## 2020-02-10 ENCOUNTER — Ambulatory Visit (INDEPENDENT_AMBULATORY_CARE_PROVIDER_SITE_OTHER): Payer: Medicare Other | Admitting: Physician Assistant

## 2020-02-10 ENCOUNTER — Other Ambulatory Visit: Payer: Self-pay

## 2020-02-10 ENCOUNTER — Encounter: Payer: Self-pay | Admitting: Physician Assistant

## 2020-02-10 VITALS — Temp 97.7°F | Ht 67.0 in | Wt 173.0 lb

## 2020-02-10 DIAGNOSIS — K2970 Gastritis, unspecified, without bleeding: Secondary | ICD-10-CM | POA: Diagnosis not present

## 2020-02-10 DIAGNOSIS — E1169 Type 2 diabetes mellitus with other specified complication: Secondary | ICD-10-CM

## 2020-02-10 DIAGNOSIS — E785 Hyperlipidemia, unspecified: Secondary | ICD-10-CM

## 2020-02-10 DIAGNOSIS — E039 Hypothyroidism, unspecified: Secondary | ICD-10-CM

## 2020-02-10 DIAGNOSIS — K5903 Drug induced constipation: Secondary | ICD-10-CM

## 2020-02-10 DIAGNOSIS — Z0000 Encounter for general adult medical examination without abnormal findings: Secondary | ICD-10-CM

## 2020-02-10 DIAGNOSIS — E119 Type 2 diabetes mellitus without complications: Secondary | ICD-10-CM

## 2020-02-10 DIAGNOSIS — G43901 Migraine, unspecified, not intractable, with status migrainosus: Secondary | ICD-10-CM

## 2020-02-10 MED ORDER — SIMVASTATIN 40 MG PO TABS: 40.0000 mg | ORAL_TABLET | Freq: Every day | ORAL | 0 refills | Status: DC

## 2020-02-10 MED ORDER — FUROSEMIDE 20 MG PO TABS
20.0000 mg | ORAL_TABLET | Freq: Every day | ORAL | 0 refills | Status: DC
Start: 2020-02-10 — End: 2020-05-04

## 2020-02-10 MED ORDER — LISINOPRIL 20 MG PO TABS
20.0000 mg | ORAL_TABLET | Freq: Two times a day (BID) | ORAL | 0 refills | Status: DC
Start: 2020-02-10 — End: 2020-05-04

## 2020-02-10 MED ORDER — METFORMIN HCL 1000 MG PO TABS: 1000.0000 mg | ORAL_TABLET | Freq: Two times a day (BID) | ORAL | 0 refills | Status: DC

## 2020-02-10 MED ORDER — LINACLOTIDE 72 MCG PO CAPS: 72.0000 ug | ORAL_CAPSULE | Freq: Every day | ORAL | 0 refills | Status: DC

## 2020-02-10 MED ORDER — TOPIRAMATE 100 MG PO TABS: 100.0000 mg | ORAL_TABLET | Freq: Every day | ORAL | 0 refills | Status: AC

## 2020-02-10 MED ORDER — LEVOTHYROXINE SODIUM 125 MCG PO TABS: 125.0000 ug | ORAL_TABLET | Freq: Every day | ORAL | 0 refills | Status: DC

## 2020-02-10 MED ORDER — EZETIMIBE 10 MG PO TABS: 10.0000 mg | ORAL_TABLET | Freq: Every day | ORAL | 0 refills | Status: DC

## 2020-02-10 MED ORDER — PANTOPRAZOLE SODIUM 20 MG PO TBEC
20.0000 mg | DELAYED_RELEASE_TABLET | Freq: Every day | ORAL | 0 refills | Status: DC
Start: 2020-02-10 — End: 2020-05-04

## 2020-02-10 NOTE — Progress Notes (Signed)
Telehealth office visit note for Alejandra Reid, PA-C- at Primary Care at [facility]   I connected with current patient today by telephone and verified that I am speaking with the correct person   . Location of the patient: Home . Location of the provider: Office - This visit type was conducted due to national recommendations for restrictions regarding the COVID-19 Pandemic (e.g. social distancing) in an effort to limit this patient's exposure and mitigate transmission in our community.    - No physical exam could be performed with this format, beyond that communicated to [place] by the patient/ family members as noted.   - Additionally my office staff/ schedulers were to discuss with the patient that there may be a monetary charge related to this service, depending on their medical insurance.  My understanding is that patient understood and consented to proceed.     _________________________________________________________________________________   History of Present Illness: Pt calls in to follow-up on diabetes mellitus, hypertension, hyperlipidemia, and hypothyroid. Pt reports she is doing well and has no acute concerns. Also requesting medication refills.  Diabetes: Pt denies increased urination or thirst. Pt reports medication compliance. States she hasn't been checking her glucose at home like she should, but has worked on her diet and reduced glucose.  HTN: Pt denies new onset chest pain, palpitations, dizziness or lower extremity welling. Taking medication as directed without side effects. States her blood pressure has been good.  HLD: Pt taking medication as directed without issues. Denies side effects including myalgias and RUQ pain.   Hypothyroid: Denies heat/cold intolerance or fatigue. Reports compliance with medication.  Migraines: Reports headaches are not as bad and not as often/frequent.  GAD 7 : Generalized Anxiety Score 09/21/2019  Nervous, Anxious, on Edge 0   Control/stop worrying 0  Worry too much - different things 0  Trouble relaxing 0  Restless 0  Easily annoyed or irritable 0  Afraid - awful might happen 1  Total GAD 7 Score 1  Anxiety Difficulty Not difficult at all    Depression screen [facility] 2/9 02/10/2020 11/18/2019 09/21/2019 03/09/2019 11/13/2018  Decreased Interest 0 2 0 2 0  Down, Depressed, Hopeless 0 '1 3 1 2  ' PHQ - 2 Score 0 '3 3 3 2  ' Altered sleeping 1 0 0 2 1  Tired, decreased energy '1 2 3 3 3  ' Change in appetite 0 '2 3 2 1  ' Feeling bad or failure about yourself  '1 2 3 3 3  ' Trouble concentrating 0 1 0 1 0  Moving slowly or fidgety/restless 0 0 '1 2 3  ' Suicidal thoughts 0 0 0 0 0  PHQ-9 Score '3 10 13 16 13  ' Difficult doing work/chores Not difficult at all Very difficult Somewhat difficult Very difficult Very difficult  Some recent data might be hidden      Impression and Recommendations:     1. Diabetes mellitus without complication ([employer])   2. Hyperlipidemia associated with type 2 diabetes mellitus ([place])   3. Constipation due to pain medication therapy   4. Gastritis without bleeding, unspecified chronicity, unspecified gastritis type   5. Hypothyroidism, unspecified type     Diabetes Mellitus: - Last A1c 7.9, stable - Continue current medication regimen. - Continue ambulatory glucose monitoring.. - Follow low glucose/carbohydrate diet and stay as active as possible. - Schedule lab visit for fasting blood work including A1c  HLD associated with T2DM: - Last lipid panel: triglycerides 286, LDL 55 - Continue current medication regimen. -  Follow heart healthy diet.  Hypothyroid: -Asymptomatic. -Last TSH wnl -Continue current medication regimen.  Constipation: -Stable -Continue current medication regimen. Provided refills. -Recommend dietary fiber and good hydration.  Gastritis: -Stable -Continue current medication regimen. Provided refills.  Migraines: -Stable. -Continue current medication regimen.  Provided refills.   - As part of my medical decision making, I reviewed the following data within the [place] History obtained from pt /family, CMA notes reviewed and incorporated if applicable, Labs reviewed, Radiograph/ tests reviewed if applicable and OV notes from prior OV's with me, as well as any other specialists she/he has seen since seeing me last, were all reviewed and used in my medical decision making process today.    - Additionally, when appropriate, discussion had with patient regarding our treatment plan, and their biases/concerns about that plan were used in my medical decision making today.    - The patient agreed with the plan and demonstrated an understanding of the instructions.   No barriers to understanding were identified.     - The patient was advised to call back or seek an in-person evaluation if the symptoms worsen or if the condition fails to improve as anticipated.   Return in about 3 months (around 05/12/2020) for CPE; lab visit for [place] within next few weeks.    No orders of the defined types were placed in this encounter.   Meds ordered this encounter  Medications  . ezetimibe (ZETIA) 10 MG tablet    Sig: Take 1 tablet (10 mg total) by mouth daily.    Dispense:  90 tablet    Refill:  0  . linaclotide (LINZESS) 72 MCG capsule    Sig: Take 1 capsule (72 mcg total) by mouth daily before breakfast.    Dispense:  90 capsule    Refill:  0  . pantoprazole (PROTONIX) 20 MG tablet    Sig: Take 1 tablet (20 mg total) by mouth daily.    Dispense:  90 tablet    Refill:  0  . levothyroxine (SYNTHROID) 125 MCG tablet    Sig: Take 1 tablet (125 mcg total) by mouth daily.    Dispense:  90 tablet    Refill:  0  . topiramate (TOPAMAX) 100 MG tablet    Sig: Take 1 tablet (100 mg total) by mouth at bedtime. Max dose is 198m at bedtime    Dispense:  180 tablet    Refill:  0  . lisinopril (ZESTRIL) 20 MG tablet    Sig: Take 1 tablet (20 mg  total) by mouth 2 (two) times daily.    Dispense:  180 tablet    Refill:  0  . metFORMIN (GLUCOPHAGE) 1000 MG tablet    Sig: Take 1 tablet (1,000 mg total) by mouth 2 (two) times daily with a meal. (needs ov b-4 RF)    Dispense:  180 tablet    Refill:  0  . furosemide (LASIX) 20 MG tablet    Sig: Take 1 tablet (20 mg total) by mouth daily.    Dispense:  90 tablet    Refill:  0  . simvastatin (ZOCOR) 40 MG tablet    Sig: Take 1 tablet (40 mg total) by mouth daily at 6 PM.    Dispense:  90 tablet    Refill:  0    Medications Discontinued During This Encounter  Medication Reason  . furosemide (LASIX) 20 MG tablet Reorder  . lisinopril (ZESTRIL) 20 MG tablet Reorder  . topiramate (TOPAMAX)  100 MG tablet Reorder  . ezetimibe (ZETIA) 10 MG tablet Reorder  . levothyroxine (SYNTHROID) 125 MCG tablet Reorder  . linaclotide (LINZESS) 72 MCG capsule Reorder  . pantoprazole (PROTONIX) 20 MG tablet Reorder  . metFORMIN (GLUCOPHAGE) 1000 MG tablet Reorder  . simvastatin (ZOCOR) 40 MG tablet Reorder       Time spent on visit including pre-visit chart review and post-visit care was 15 minutes.      The [place] was signed into law in 2016 which includes the topic of electronic health records.  This provides immediate access to information in MyChart.  This includes consultation notes, operative notes, office notes, lab results and pathology reports.  If you have any questions about what you read please let us know at your next visit or call us at the office.  We are right here with you.   __________________________________________________________________________________     Patient Care Team    Relationship Specialty Notifications Start End  Alejandra Taylor, [place] PCP - General Physician Assistant  11/18/19   Melrose Nakayama, MD Consulting Physician Orthopedic Surgery  04/18/17   Juluis Rainier  Optometry  08/08/17   Dian Situ, MD  Pain Medicine  09/21/19   Tracie Harrier, MD Consulting Physician Nephrology  09/21/19      -Vitals obtained; medications/ allergies reconciled;  personal medical, social, Sx etc.histories were updated by CMA, reviewed by me and are reflected in chart   Patient Active Problem List   Diagnosis Date Noted  . Diabetic nephropathy associated with type 2 diabetes mellitus ([place]) 09/21/2019  . CKD (chronic kidney disease), stage II 09/21/2019  . Gastritis without bleeding 09/21/2019  . Elevated alanine aminotransferase (ALT) level 09/21/2019  . Hyperplastic polyp of cecum 09/21/2019  . 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 Urinary incontinence 12/11/2017  . Constipation due to pain medication therapy 12/11/2017  . Need for hepatitis C screening test 12/11/2017  . Diabetes mellitus without complication ([place]) 45/85/9292  . Healthcare maintenance 06/10/2017  . Colitis 05/24/2017  . Tobacco abuse 05/24/2017  . UTI (urinary tract infection) 05/24/2017  . Depression with anxiety 05/24/2017  . AKI (acute kidney injury) ([place]) 05/24/2017  . Hyponatremia 05/24/2017  . Migraine with status migrainosus, not intractable 04/17/2017  . Other chronic pain 04/17/2017  . Hypertension associated with diabetes ([place]) 04/17/2017  . PTSD (post-traumatic stress disorder) 04/17/2017  . Hypothyroidism 04/17/2017  . Hyperlipidemia associated with type 2 diabetes mellitus ([place]) 04/17/2017  . Endometrial cancer ([place]) 01/22/2012     Current Meds  Medication Sig  . albuterol (PROVENTIL HFA;VENTOLIN HFA) 108 (90 Base) MCG/ACT inhaler Inhale 2 puffs into the lungs every 6 (six) hours as needed for wheezing or shortness of breath.  . clonazePAM (KLONOPIN) 1 MG tablet Take 1 mg by mouth 3 (three) times daily.  . cyclobenzaprine (FLEXERIL) 10 MG tablet Take 1 tablet by mouth 4 (four) times daily as needed.  . estazolam (PROSOM) 2 MG tablet TAKE 1 TABLET AT BEDTIME AS NEEDED FOR SLEEP MAY REPEAT ONCE AS NEEDED ONLY IF MID-AWAKENING  .  HYDROcodone-acetaminophen (NORCO) 10-325 MG tablet TAKE 1 OR 2 TABLETS BY MOUTH EVERY 6 HOURS AS NEEDED FOR PAIN MUST LAST 30 DAYS  . NARCAN 4 MG/0.1ML LIQD nasal spray kit 1 AS NEEDED FOR OVERDOSE  . QUEtiapine (SEROQUEL) 300 MG tablet Take 300 mg by mouth at bedtime.  Marland Kitchen tiZANidine (ZANAFLEX) 2 MG capsule TAKE 1 CAP AS NEEDED ONCE A DAY FOR MUSCLE SPASM  . venlafaxine  XR (EFFEXOR-XR) 150 MG 24 hr capsule Take 1 capsule every morning and 1 capsule at night  . [DISCONTINUED] ezetimibe (ZETIA) 10 MG tablet Take 1 tablet (10 mg total) by mouth daily.  . [DISCONTINUED] furosemide (LASIX) 20 MG tablet Take 1 tablet by mouth daily.  . [DISCONTINUED] levothyroxine (SYNTHROID) 125 MCG tablet Take 1 tablet (125 mcg total) by mouth daily.  . [DISCONTINUED] linaclotide (LINZESS) 72 MCG capsule Take 1 capsule (72 mcg total) by mouth daily before breakfast.  . [DISCONTINUED] lisinopril (ZESTRIL) 20 MG tablet Take 1 tablet by mouth 2 (two) times daily.  . [DISCONTINUED] metFORMIN (GLUCOPHAGE) 1000 MG tablet Take 1 tablet (1,000 mg total) by mouth 2 (two) times daily with a meal. (needs ov b-4 RF)  . [DISCONTINUED] pantoprazole (PROTONIX) 20 MG tablet Take 1 tablet (20 mg total) by mouth daily.  . [DISCONTINUED] simvastatin (ZOCOR) 40 MG tablet Take 1 tablet (40 mg total) by mouth daily at 6 PM.  . [DISCONTINUED] topiramate (TOPAMAX) 100 MG tablet Take 1 tablet (100 mg total) by mouth at bedtime. Max dose is 129m at bedtime  . ezetimibe (ZETIA) 10 MG tablet Take 1 tablet (10 mg total) by mouth daily.  . furosemide (LASIX) 20 MG tablet Take 1 tablet (20 mg total) by mouth daily.  .Marland Kitchenlevothyroxine (SYNTHROID) 125 MCG tablet Take 1 tablet (125 mcg total) by mouth daily.  .Marland Kitchenlinaclotide (LINZESS) 72 MCG capsule Take 1 capsule (72 mcg total) by mouth daily before breakfast.  . lisinopril (ZESTRIL) 20 MG tablet Take 1 tablet (20 mg total) by mouth 2 (two) times daily.  . metFORMIN (GLUCOPHAGE) 1000 MG tablet Take 1  tablet (1,000 mg total) by mouth 2 (two) times daily with a meal. (needs ov b-4 RF)  . pantoprazole (PROTONIX) 20 MG tablet Take 1 tablet (20 mg total) by mouth daily.  . simvastatin (ZOCOR) 40 MG tablet Take 1 tablet (40 mg total) by mouth daily at 6 PM.  . topiramate (TOPAMAX) 100 MG tablet Take 1 tablet (100 mg total) by mouth at bedtime. Max dose is 1041mat bedtime   Current Facility-Administered Medications for the 02/10/20 encounter (Office Visit) with AbLorrene ReidPA-C  Medication  . ipratropium-albuterol (DUONEB) 0.5-2.5 (3) MG/3ML nebulizer solution 3 mL     Allergies:  Allergies  Allergen Reactions  . Other Hives    neoprene  . Penicillins Rash     ROS:  See above HPI for pertinent positives and negatives   Objective:   Temperature 97.7 F (36.5 C), height '5\' 7"'  (1.702 m), weight 173 lb (78.5 kg).  (if some vitals are omitted, this means that patient was UNABLE to obtain them even though they were asked to get them prior to OV today.  They were asked to call usKoreat their earliest convenience with these once obtained. ) General: A & O * 3; sounds in no acute distress; in usual state of health.  Respiratory: speaking in full sentences, no conversational dyspnea;  Psych: insight appears good, mood- appears full

## 2020-03-08 ENCOUNTER — Encounter: Payer: Self-pay | Admitting: Medical-Surgical

## 2020-03-08 ENCOUNTER — Telehealth (INDEPENDENT_AMBULATORY_CARE_PROVIDER_SITE_OTHER): Payer: Medicare Other | Admitting: Medical-Surgical

## 2020-03-08 ENCOUNTER — Other Ambulatory Visit: Payer: Self-pay

## 2020-03-08 VITALS — BP 128/75 | Temp 99.7°F | Ht 67.0 in | Wt 165.2 lb

## 2020-03-08 DIAGNOSIS — J069 Acute upper respiratory infection, unspecified: Secondary | ICD-10-CM

## 2020-03-08 MED ORDER — GUAIFENESIN-CODEINE 100-10 MG/5ML PO SYRP: 5.0000 mL | ORAL_SOLUTION | Freq: Three times a day (TID) | ORAL | 0 refills | Status: DC | PRN

## 2020-03-08 MED ORDER — AZITHROMYCIN 250 MG PO TABS: ORAL_TABLET | ORAL | 0 refills | Status: DC

## 2020-03-08 MED ORDER — BENZONATATE 100 MG PO CAPS: 100.0000 mg | ORAL_CAPSULE | Freq: Three times a day (TID) | ORAL | 0 refills | Status: DC | PRN

## 2020-03-08 NOTE — Progress Notes (Signed)
Virtual Visit via Telephone   I connected with  Alejandra Taylor  on 03/08/20 by telephone/telehealth and verified that I am speaking with the correct person using two identifiers.   I discussed the limitations, risks, security and privacy concerns of performing an evaluation and management service by telephone, including the higher likelihood of inaccurate diagnosis and treatment, and the availability of in person appointments.  We also discussed the likely need of an additional face to face encounter for complete and high quality delivery of care.  I also discussed with the patient that there may be a patient responsible charge related to this service. The patient expressed understanding and wishes to proceed.  Provider location is in medical facility. Patient location is at their home, different from provider location. People involved in care of the patient during this telehealth encounter were myself, my nurse/medical assistant, and my front office/scheduling team member.  CC: upper respiratory symptoms  HPI: Pleasant 59 year old female presenting via telephone with reports of 2 weeks of upper respiratory symptoms including fever t-max 100.8, chills, fatigue, sore throat, hoarse voice, body aches, brain fog, mild shortness of breath, cough productive of yellowish-gray mucus, sinus congestion, and chest pain when coughing. She also notes some intermittent diarrhea over the last few days but none today. She has been taking Tylenol and Benadryl without much relief of symptoms. Is careful with OTC meds because of her DM and HTN. Has had both COVID vaccines but has not been tested. Thinks she got this illness from her son that works at [employer].   Review of Systems: See HPI for pertinent positives and negatives.   Objective Findings:    General: Speaking full sentences, no audible heavy breathing.  Sounds alert and appropriately interactive.    Impression and Recommendations:    1. Viral upper  respiratory tract infection Recommend COVID testing. With co-morbidities and 2 weeks of symptoms, treating empirically with Azithromycin. Also sending in Tessalon perls and Robitussin AC for cough. Discussed emergency symptoms to monitor for that should prompt ER/UC evaluation. Recommend getting a pulse ox for home use.   Return if symptoms worsen or fail to improve.  20 minutes of non-face-to-face time was provided during this encounter.   I discussed the above assessment and treatment plan with the patient. The patient was provided an opportunity to ask questions and all were answered. The patient agreed with the plan and demonstrated an understanding of the instructions.   The patient was advised to call back or seek an in-person evaluation if the symptoms worsen or if the condition fails to improve as anticipated.  20 minutes of non-face-to-face time was provided during this encounter.  Return if symptoms worsen or fail to improve. ___________________________________________ Samuel Bouche, DNP, APRN, FNP-BC [facility] [place]

## 2020-05-04 ENCOUNTER — Other Ambulatory Visit: Payer: Self-pay | Admitting: Physician Assistant

## 2020-05-04 DIAGNOSIS — E1169 Type 2 diabetes mellitus with other specified complication: Secondary | ICD-10-CM

## 2020-05-04 DIAGNOSIS — E785 Hyperlipidemia, unspecified: Secondary | ICD-10-CM

## 2020-05-04 DIAGNOSIS — E039 Hypothyroidism, unspecified: Secondary | ICD-10-CM

## 2020-05-04 DIAGNOSIS — K2970 Gastritis, unspecified, without bleeding: Secondary | ICD-10-CM

## 2020-05-18 ENCOUNTER — Other Ambulatory Visit: Payer: Self-pay | Admitting: Physician Assistant

## 2020-05-18 DIAGNOSIS — K5903 Drug induced constipation: Secondary | ICD-10-CM

## 2020-05-27 ENCOUNTER — Other Ambulatory Visit: Payer: Self-pay | Admitting: Physician Assistant

## 2020-05-27 ENCOUNTER — Telehealth: Payer: Self-pay | Admitting: Physician Assistant

## 2020-05-27 DIAGNOSIS — K2970 Gastritis, unspecified, without bleeding: Secondary | ICD-10-CM

## 2020-05-27 DIAGNOSIS — E1169 Type 2 diabetes mellitus with other specified complication: Secondary | ICD-10-CM

## 2020-05-27 DIAGNOSIS — E039 Hypothyroidism, unspecified: Secondary | ICD-10-CM

## 2020-05-27 DIAGNOSIS — E785 Hyperlipidemia, unspecified: Secondary | ICD-10-CM

## 2020-05-27 NOTE — Telephone Encounter (Signed)
Please call patient to schedule OV for medication refills per last AVS. AS, CMA

## 2020-05-31 NOTE — Telephone Encounter (Signed)
Left voicemail 12-7

## 2020-06-08 ENCOUNTER — Telehealth: Payer: Self-pay | Admitting: Physician Assistant

## 2020-06-08 ENCOUNTER — Other Ambulatory Visit: Payer: Self-pay | Admitting: Physician Assistant

## 2020-06-08 DIAGNOSIS — E119 Type 2 diabetes mellitus without complications: Secondary | ICD-10-CM

## 2020-06-08 NOTE — Telephone Encounter (Signed)
Please call patient to schedule OV for med refills. AS, CMA

## 2020-06-09 ENCOUNTER — Other Ambulatory Visit: Payer: Self-pay | Admitting: Physician Assistant

## 2020-06-09 DIAGNOSIS — K2970 Gastritis, unspecified, without bleeding: Secondary | ICD-10-CM

## 2020-06-09 DIAGNOSIS — E1169 Type 2 diabetes mellitus with other specified complication: Secondary | ICD-10-CM

## 2020-06-09 DIAGNOSIS — E785 Hyperlipidemia, unspecified: Secondary | ICD-10-CM

## 2020-06-13 NOTE — Telephone Encounter (Signed)
Left vm on 06-13-20.

## 2020-06-22 ENCOUNTER — Other Ambulatory Visit: Payer: Self-pay | Admitting: Physician Assistant

## 2020-06-22 ENCOUNTER — Telehealth: Payer: Self-pay | Admitting: Physician Assistant

## 2020-06-22 DIAGNOSIS — E119 Type 2 diabetes mellitus without complications: Secondary | ICD-10-CM

## 2020-06-22 NOTE — Telephone Encounter (Signed)
Please contact patient to schedule apt for med refills per last AVS. AS, CMA 

## 2020-07-18 DIAGNOSIS — F4310 Post-traumatic stress disorder, unspecified: Secondary | ICD-10-CM | POA: Diagnosis not present

## 2020-07-18 DIAGNOSIS — F4001 Agoraphobia with panic disorder: Secondary | ICD-10-CM | POA: Diagnosis not present

## 2020-07-18 DIAGNOSIS — F330 Major depressive disorder, recurrent, mild: Secondary | ICD-10-CM | POA: Diagnosis not present

## 2020-07-21 ENCOUNTER — Other Ambulatory Visit: Payer: Self-pay | Admitting: Physician Assistant

## 2020-07-21 ENCOUNTER — Telehealth: Payer: Self-pay | Admitting: Physician Assistant

## 2020-07-21 DIAGNOSIS — E1169 Type 2 diabetes mellitus with other specified complication: Secondary | ICD-10-CM

## 2020-07-21 DIAGNOSIS — K2970 Gastritis, unspecified, without bleeding: Secondary | ICD-10-CM

## 2020-07-21 NOTE — Telephone Encounter (Signed)
Please call pt to schedule apt for further med refills per last AVS with [facility]. AS, CMA

## 2020-07-21 NOTE — Telephone Encounter (Signed)
Left vm on 07-21-20. 

## 2020-08-09 ENCOUNTER — Telehealth: Payer: Self-pay | Admitting: Physician Assistant

## 2020-08-09 ENCOUNTER — Other Ambulatory Visit: Payer: Self-pay | Admitting: Physician Assistant

## 2020-08-09 DIAGNOSIS — E1169 Type 2 diabetes mellitus with other specified complication: Secondary | ICD-10-CM

## 2020-08-09 NOTE — Telephone Encounter (Signed)
Please contact pt to schedule apt per last AVS for future med refills. AS, CMA

## 2020-08-09 NOTE — Telephone Encounter (Signed)
Spoke with Patient and made aware she needs to schedule per last AVS and she chose not to at the time and will call back. 08-09-20

## 2020-08-17 ENCOUNTER — Other Ambulatory Visit: Payer: Self-pay | Admitting: Physician Assistant

## 2020-08-17 ENCOUNTER — Telehealth: Payer: Self-pay | Admitting: Physician Assistant

## 2020-08-17 DIAGNOSIS — K2970 Gastritis, unspecified, without bleeding: Secondary | ICD-10-CM

## 2020-08-17 NOTE — Telephone Encounter (Signed)
Patient notified via voicemail.

## 2020-08-17 NOTE — Telephone Encounter (Signed)
Please contact patient to advise she will need an appointment for further medication refills. Please see last AVS.

## 2020-08-20 ENCOUNTER — Other Ambulatory Visit: Payer: Self-pay | Admitting: Physician Assistant

## 2020-08-20 DIAGNOSIS — E119 Type 2 diabetes mellitus without complications: Secondary | ICD-10-CM

## 2020-08-23 ENCOUNTER — Other Ambulatory Visit: Payer: Self-pay | Admitting: Physician Assistant

## 2020-08-23 DIAGNOSIS — E1169 Type 2 diabetes mellitus with other specified complication: Secondary | ICD-10-CM

## 2020-08-27 ENCOUNTER — Other Ambulatory Visit: Payer: Self-pay | Admitting: Physician Assistant

## 2020-08-27 DIAGNOSIS — E1169 Type 2 diabetes mellitus with other specified complication: Secondary | ICD-10-CM

## 2020-08-27 DIAGNOSIS — E785 Hyperlipidemia, unspecified: Secondary | ICD-10-CM

## 2020-08-28 ENCOUNTER — Telehealth: Payer: Self-pay | Admitting: Physician Assistant

## 2020-08-28 NOTE — Telephone Encounter (Signed)
Please advise patient, per refill protocol, no further medication refills until has apt with provider.   We have made several attempts to schedule apt for patient with no compliance. AS, CMA

## 2020-08-29 NOTE — Telephone Encounter (Signed)
Left voicemail explaining to patient she needs an appointment for further medication refills. 08-29-20

## 2020-09-02 ENCOUNTER — Inpatient Hospital Stay (HOSPITAL_COMMUNITY)
Admission: EM | Admit: 2020-09-02 | Discharge: 2020-09-06 | DRG: 481 | Disposition: A | Discharge status: Home Health | Payer: Medicare HMO | Attending: Internal Medicine | Admitting: Internal Medicine

## 2020-09-02 ENCOUNTER — Encounter (HOSPITAL_COMMUNITY): Payer: Self-pay

## 2020-09-02 ENCOUNTER — Other Ambulatory Visit: Payer: Self-pay

## 2020-09-02 ENCOUNTER — Emergency Department (HOSPITAL_COMMUNITY): Payer: Medicare HMO

## 2020-09-02 DIAGNOSIS — Z20822 Contact with and (suspected) exposure to covid-19: Secondary | ICD-10-CM | POA: Diagnosis present

## 2020-09-02 DIAGNOSIS — Z90710 Acquired absence of both cervix and uterus: Secondary | ICD-10-CM | POA: Diagnosis not present

## 2020-09-02 DIAGNOSIS — Z8542 Personal history of malignant neoplasm of other parts of uterus: Secondary | ICD-10-CM | POA: Diagnosis not present

## 2020-09-02 DIAGNOSIS — F4310 Post-traumatic stress disorder, unspecified: Secondary | ICD-10-CM | POA: Diagnosis present

## 2020-09-02 DIAGNOSIS — E1122 Type 2 diabetes mellitus with diabetic chronic kidney disease: Secondary | ICD-10-CM | POA: Diagnosis present

## 2020-09-02 DIAGNOSIS — G43901 Migraine, unspecified, not intractable, with status migrainosus: Secondary | ICD-10-CM | POA: Diagnosis present

## 2020-09-02 DIAGNOSIS — Z79899 Other long term (current) drug therapy: Secondary | ICD-10-CM | POA: Diagnosis not present

## 2020-09-02 DIAGNOSIS — Z803 Family history of malignant neoplasm of breast: Secondary | ICD-10-CM | POA: Diagnosis not present

## 2020-09-02 DIAGNOSIS — R197 Diarrhea, unspecified: Secondary | ICD-10-CM

## 2020-09-02 DIAGNOSIS — E871 Hypo-osmolality and hyponatremia: Secondary | ICD-10-CM | POA: Diagnosis present

## 2020-09-02 DIAGNOSIS — Z79890 Hormone replacement therapy (postmenopausal): Secondary | ICD-10-CM

## 2020-09-02 DIAGNOSIS — E039 Hypothyroidism, unspecified: Secondary | ICD-10-CM | POA: Diagnosis not present

## 2020-09-02 DIAGNOSIS — S72141G Displaced intertrochanteric fracture of right femur, subsequent encounter for closed fracture with delayed healing: Secondary | ICD-10-CM

## 2020-09-02 DIAGNOSIS — M25572 Pain in left ankle and joints of left foot: Secondary | ICD-10-CM | POA: Diagnosis not present

## 2020-09-02 DIAGNOSIS — E785 Hyperlipidemia, unspecified: Secondary | ICD-10-CM | POA: Diagnosis present

## 2020-09-02 DIAGNOSIS — G8929 Other chronic pain: Secondary | ICD-10-CM | POA: Diagnosis present

## 2020-09-02 DIAGNOSIS — N182 Chronic kidney disease, stage 2 (mild): Secondary | ICD-10-CM | POA: Diagnosis present

## 2020-09-02 DIAGNOSIS — I10 Essential (primary) hypertension: Secondary | ICD-10-CM | POA: Diagnosis not present

## 2020-09-02 DIAGNOSIS — N179 Acute kidney failure, unspecified: Secondary | ICD-10-CM | POA: Diagnosis not present

## 2020-09-02 DIAGNOSIS — M810 Age-related osteoporosis without current pathological fracture: Secondary | ICD-10-CM | POA: Diagnosis present

## 2020-09-02 DIAGNOSIS — E860 Dehydration: Secondary | ICD-10-CM | POA: Diagnosis present

## 2020-09-02 DIAGNOSIS — K2970 Gastritis, unspecified, without bleeding: Secondary | ICD-10-CM | POA: Diagnosis present

## 2020-09-02 DIAGNOSIS — M25551 Pain in right hip: Secondary | ICD-10-CM | POA: Diagnosis not present

## 2020-09-02 DIAGNOSIS — Z9049 Acquired absence of other specified parts of digestive tract: Secondary | ICD-10-CM

## 2020-09-02 DIAGNOSIS — M8000XA Age-related osteoporosis with current pathological fracture, unspecified site, initial encounter for fracture: Secondary | ICD-10-CM | POA: Diagnosis not present

## 2020-09-02 DIAGNOSIS — D72829 Elevated white blood cell count, unspecified: Secondary | ICD-10-CM

## 2020-09-02 DIAGNOSIS — Y92002 Bathroom of unspecified non-institutional (private) residence single-family (private) house as the place of occurrence of the external cause: Secondary | ICD-10-CM

## 2020-09-02 DIAGNOSIS — W1839XA Other fall on same level, initial encounter: Secondary | ICD-10-CM | POA: Diagnosis present

## 2020-09-02 DIAGNOSIS — D62 Acute posthemorrhagic anemia: Secondary | ICD-10-CM | POA: Diagnosis not present

## 2020-09-02 DIAGNOSIS — S72001A Fracture of unspecified part of neck of right femur, initial encounter for closed fracture: Secondary | ICD-10-CM

## 2020-09-02 DIAGNOSIS — I959 Hypotension, unspecified: Secondary | ICD-10-CM | POA: Diagnosis not present

## 2020-09-02 DIAGNOSIS — E119 Type 2 diabetes mellitus without complications: Secondary | ICD-10-CM

## 2020-09-02 DIAGNOSIS — E11649 Type 2 diabetes mellitus with hypoglycemia without coma: Secondary | ICD-10-CM | POA: Diagnosis present

## 2020-09-02 DIAGNOSIS — E876 Hypokalemia: Secondary | ICD-10-CM | POA: Diagnosis present

## 2020-09-02 DIAGNOSIS — I152 Hypertension secondary to endocrine disorders: Secondary | ICD-10-CM | POA: Diagnosis present

## 2020-09-02 DIAGNOSIS — E1159 Type 2 diabetes mellitus with other circulatory complications: Secondary | ICD-10-CM | POA: Diagnosis present

## 2020-09-02 DIAGNOSIS — F17210 Nicotine dependence, cigarettes, uncomplicated: Secondary | ICD-10-CM | POA: Diagnosis present

## 2020-09-02 DIAGNOSIS — F418 Other specified anxiety disorders: Secondary | ICD-10-CM | POA: Diagnosis present

## 2020-09-02 DIAGNOSIS — E1169 Type 2 diabetes mellitus with other specified complication: Secondary | ICD-10-CM | POA: Diagnosis present

## 2020-09-02 DIAGNOSIS — E7800 Pure hypercholesterolemia, unspecified: Secondary | ICD-10-CM | POA: Diagnosis present

## 2020-09-02 DIAGNOSIS — Z800 Family history of malignant neoplasm of digestive organs: Secondary | ICD-10-CM

## 2020-09-02 DIAGNOSIS — S72141A Displaced intertrochanteric fracture of right femur, initial encounter for closed fracture: Secondary | ICD-10-CM | POA: Diagnosis not present

## 2020-09-02 DIAGNOSIS — Z7984 Long term (current) use of oral hypoglycemic drugs: Secondary | ICD-10-CM | POA: Diagnosis not present

## 2020-09-02 DIAGNOSIS — R0902 Hypoxemia: Secondary | ICD-10-CM | POA: Diagnosis not present

## 2020-09-02 DIAGNOSIS — S72141D Displaced intertrochanteric fracture of right femur, subsequent encounter for closed fracture with routine healing: Secondary | ICD-10-CM | POA: Diagnosis not present

## 2020-09-02 DIAGNOSIS — R109 Unspecified abdominal pain: Secondary | ICD-10-CM | POA: Diagnosis not present

## 2020-09-02 DIAGNOSIS — I129 Hypertensive chronic kidney disease with stage 1 through stage 4 chronic kidney disease, or unspecified chronic kidney disease: Secondary | ICD-10-CM | POA: Diagnosis present

## 2020-09-02 DIAGNOSIS — Z23 Encounter for immunization: Secondary | ICD-10-CM | POA: Diagnosis not present

## 2020-09-02 DIAGNOSIS — Z0389 Encounter for observation for other suspected diseases and conditions ruled out: Secondary | ICD-10-CM | POA: Diagnosis not present

## 2020-09-02 DIAGNOSIS — G43001 Migraine without aura, not intractable, with status migrainosus: Secondary | ICD-10-CM | POA: Diagnosis not present

## 2020-09-02 LAB — CBC WITH DIFFERENTIAL/PLATELET
Abs Immature Granulocytes: 0.08 10*3/uL — ABNORMAL HIGH (ref 0.00–0.07)
Basophils Absolute: 0.1 10*3/uL (ref 0.0–0.1)
Basophils Relative: 0 %
Eosinophils Absolute: 0 10*3/uL (ref 0.0–0.5)
Eosinophils Relative: 0 %
HCT: 39.9 % (ref 36.0–46.0)
Hemoglobin: 14.6 g/dL (ref 12.0–15.0)
Immature Granulocytes: 1 %
Lymphocytes Relative: 7 %
Lymphs Abs: 1.1 10*3/uL (ref 0.7–4.0)
MCH: 32.7 pg (ref 26.0–34.0)
MCHC: 36.6 g/dL — ABNORMAL HIGH (ref 30.0–36.0)
MCV: 89.3 fL (ref 80.0–100.0)
Monocytes Absolute: 1.1 10*3/uL — ABNORMAL HIGH (ref 0.1–1.0)
Monocytes Relative: 6 %
Neutro Abs: 14.8 10*3/uL — ABNORMAL HIGH (ref 1.7–7.7)
Neutrophils Relative %: 86 %
Platelets: 310 10*3/uL (ref 150–400)
RBC: 4.47 MIL/uL (ref 3.87–5.11)
RDW: 12.1 % (ref 11.5–15.5)
WBC: 17.1 10*3/uL — ABNORMAL HIGH (ref 4.0–10.5)
nRBC: 0 % (ref 0.0–0.2)

## 2020-09-02 LAB — MAGNESIUM: Magnesium: 1.9 mg/dL (ref 1.7–2.4)

## 2020-09-02 LAB — URINALYSIS, ROUTINE W REFLEX MICROSCOPIC
Bilirubin Urine: NEGATIVE
Glucose, UA: NEGATIVE mg/dL
Ketones, ur: 80 mg/dL — AB
Nitrite: POSITIVE — AB
Protein, ur: NEGATIVE mg/dL
Specific Gravity, Urine: 1.024 (ref 1.005–1.030)
pH: 5 (ref 5.0–8.0)

## 2020-09-02 LAB — TYPE AND SCREEN
ABO/RH(D): AB NEG
Antibody Screen: NEGATIVE

## 2020-09-02 LAB — PROTIME-INR
INR: 1.1 (ref 0.8–1.2)
Prothrombin Time: 13.8 seconds (ref 11.4–15.2)

## 2020-09-02 LAB — BASIC METABOLIC PANEL
Anion gap: 26 — ABNORMAL HIGH (ref 5–15)
BUN: 21 mg/dL — ABNORMAL HIGH (ref 6–20)
CO2: 22 mmol/L (ref 22–32)
Calcium: 9.2 mg/dL (ref 8.9–10.3)
Chloride: 84 mmol/L — ABNORMAL LOW (ref 98–111)
Creatinine, Ser: 1.37 mg/dL — ABNORMAL HIGH (ref 0.44–1.00)
GFR, Estimated: 44 mL/min — ABNORMAL LOW (ref >60)
Glucose, Bld: 162 mg/dL — ABNORMAL HIGH (ref 70–99)
Potassium: 2.4 mmol/L — CL (ref 3.5–5.1)
Sodium: 132 mmol/L — ABNORMAL LOW (ref 135–145)

## 2020-09-02 LAB — ABO/RH: ABO/RH(D): AB NEG

## 2020-09-02 LAB — GLUCOSE, CAPILLARY: Glucose-Capillary: 74 mg/dL (ref 70–99)

## 2020-09-02 MED ORDER — SODIUM CHLORIDE 0.9 % IV BOLUS
1000.0000 mL | Freq: Once | INTRAVENOUS | Status: AC
  Administered 2020-09-02: 1000 mL via INTRAVENOUS

## 2020-09-02 MED ORDER — INFLUENZA VAC SPLIT QUAD 0.5 ML IM SUSY
0.5000 mL | PREFILLED_SYRINGE | Freq: Once | INTRAMUSCULAR | Status: AC
  Administered 2020-09-02: 0.5 mL via INTRAMUSCULAR
  Filled 2020-09-02: qty 0.5

## 2020-09-02 MED ORDER — POTASSIUM CHLORIDE CRYS ER 20 MEQ PO TBCR
40.0000 meq | EXTENDED_RELEASE_TABLET | Freq: Once | ORAL | Status: AC
  Administered 2020-09-02: 40 meq via ORAL
  Filled 2020-09-02: qty 2

## 2020-09-02 MED ORDER — EZETIMIBE 10 MG PO TABS
10.0000 mg | ORAL_TABLET | Freq: Every day | ORAL | Status: DC
  Administered 2020-09-03 – 2020-09-06 (×4): 10 mg via ORAL
  Filled 2020-09-02 (×4): qty 1

## 2020-09-02 MED ORDER — MORPHINE SULFATE (PF) 4 MG/ML IV SOLN
4.0000 mg | INTRAVENOUS | Status: DC | PRN
  Administered 2020-09-02: 4 mg via INTRAVENOUS
  Filled 2020-09-02: qty 1

## 2020-09-02 MED ORDER — SODIUM CHLORIDE 0.9 % IV SOLN: INTRAVENOUS | Status: DC

## 2020-09-02 MED ORDER — POTASSIUM CHLORIDE 10 MEQ/100ML IV SOLN
10.0000 meq | INTRAVENOUS | Status: AC
  Administered 2020-09-02 – 2020-09-03 (×2): 10 meq via INTRAVENOUS
  Filled 2020-09-02 (×2): qty 100

## 2020-09-02 MED ORDER — FENTANYL CITRATE (PF) 100 MCG/2ML IJ SOLN
50.0000 ug | Freq: Once | INTRAMUSCULAR | Status: AC
  Administered 2020-09-02: 50 ug via INTRAVENOUS
  Filled 2020-09-02: qty 2

## 2020-09-02 MED ORDER — PANTOPRAZOLE SODIUM 40 MG PO TBEC
40.0000 mg | DELAYED_RELEASE_TABLET | Freq: Every day | ORAL | Status: DC
  Administered 2020-09-03 – 2020-09-06 (×4): 40 mg via ORAL
  Filled 2020-09-02 (×4): qty 1

## 2020-09-02 MED ORDER — ONDANSETRON HCL 4 MG/2ML IJ SOLN
4.0000 mg | Freq: Once | INTRAMUSCULAR | Status: AC
  Administered 2020-09-02: 4 mg via INTRAVENOUS
  Filled 2020-09-02: qty 2

## 2020-09-02 MED ORDER — IOHEXOL 300 MG/ML  SOLN
100.0000 mL | Freq: Once | INTRAMUSCULAR | Status: AC | PRN
  Administered 2020-09-02: 100 mL via INTRAVENOUS

## 2020-09-02 MED ORDER — ALBUTEROL SULFATE HFA 108 (90 BASE) MCG/ACT IN AERS: 2.0000 | INHALATION_SPRAY | Freq: Four times a day (QID) | RESPIRATORY_TRACT | Status: DC | PRN

## 2020-09-02 MED ORDER — CLONAZEPAM 1 MG PO TABS
1.0000 mg | ORAL_TABLET | Freq: Two times a day (BID) | ORAL | Status: DC | PRN
  Administered 2020-09-03 – 2020-09-05 (×5): 1 mg via ORAL
  Filled 2020-09-02: qty 1
  Filled 2020-09-02: qty 2
  Filled 2020-09-02 (×3): qty 1

## 2020-09-02 MED ORDER — INSULIN ASPART 100 UNIT/ML ~~LOC~~ SOLN
0.0000 [IU] | Freq: Three times a day (TID) | SUBCUTANEOUS | Status: DC
  Administered 2020-09-03 – 2020-09-04 (×2): 2 [IU] via SUBCUTANEOUS
  Administered 2020-09-05: 1 [IU] via SUBCUTANEOUS
  Administered 2020-09-05: 2 [IU] via SUBCUTANEOUS
  Administered 2020-09-06: 1 [IU] via SUBCUTANEOUS

## 2020-09-02 MED ORDER — NALOXONE HCL 4 MG/0.1ML NA LIQD: 1.0000 | Freq: Once | NASAL | Status: DC

## 2020-09-02 MED ORDER — VENLAFAXINE HCL ER 150 MG PO CP24
300.0000 mg | ORAL_CAPSULE | Freq: Every day | ORAL | Status: DC
  Administered 2020-09-03 – 2020-09-06 (×4): 300 mg via ORAL
  Filled 2020-09-02: qty 4
  Filled 2020-09-02 (×4): qty 2

## 2020-09-02 MED ORDER — HYDROCODONE-ACETAMINOPHEN 5-325 MG PO TABS
1.0000 | ORAL_TABLET | Freq: Four times a day (QID) | ORAL | Status: DC | PRN
  Administered 2020-09-02 – 2020-09-04 (×5): 1 via ORAL
  Filled 2020-09-02 (×5): qty 1

## 2020-09-02 MED ORDER — HYDROMORPHONE HCL 1 MG/ML IJ SOLN
0.5000 mg | INTRAMUSCULAR | Status: DC | PRN
  Administered 2020-09-03 – 2020-09-04 (×3): 0.5 mg via INTRAVENOUS
  Filled 2020-09-02 (×3): qty 0.5

## 2020-09-02 MED ORDER — MORPHINE SULFATE (PF) 4 MG/ML IV SOLN
4.0000 mg | Freq: Once | INTRAVENOUS | Status: AC
  Administered 2020-09-02: 4 mg via INTRAVENOUS
  Filled 2020-09-02: qty 1

## 2020-09-02 MED ORDER — LEVOTHYROXINE SODIUM 25 MCG PO TABS
125.0000 ug | ORAL_TABLET | Freq: Every day | ORAL | Status: DC
  Administered 2020-09-03 – 2020-09-06 (×4): 125 ug via ORAL
  Filled 2020-09-02 (×4): qty 1

## 2020-09-02 MED ORDER — TOPIRAMATE 100 MG PO TABS
100.0000 mg | ORAL_TABLET | Freq: Every day | ORAL | Status: DC
  Administered 2020-09-02 – 2020-09-05 (×4): 100 mg via ORAL
  Filled 2020-09-02 (×4): qty 1

## 2020-09-02 MED ORDER — POTASSIUM CHLORIDE 10 MEQ/100ML IV SOLN
10.0000 meq | INTRAVENOUS | Status: AC
  Administered 2020-09-02 (×2): 10 meq via INTRAVENOUS
  Filled 2020-09-02 (×2): qty 100

## 2020-09-02 MED ORDER — SIMVASTATIN 40 MG PO TABS
40.0000 mg | ORAL_TABLET | Freq: Every day | ORAL | Status: DC
  Administered 2020-09-03 – 2020-09-05 (×3): 40 mg via ORAL
  Filled 2020-09-02 (×3): qty 1

## 2020-09-02 NOTE — ED Triage Notes (Signed)
Pt BIB [facility] from home c/o diarrhea since Monday. Fell Monday while trying to get to bathroom, right hip pain increasing since then. Husband concerned for ability to ambulate and poor po intake all week. EMS reports pt is visibly pale and unable to palpate peripheral pulses until bolus given. 22ga RAC. 544mL NS. Able to stand/pivot. Denies blood thinners.   102/60 after 54mL NS 110 initally, then 88 99% RA CBG 210

## 2020-09-02 NOTE — ED Notes (Signed)
Pt transported to CT ?

## 2020-09-02 NOTE — ED Notes (Signed)
Pt transported to Xray. 

## 2020-09-02 NOTE — H&P (Signed)
History and Physical   Alejandra Taylor BMW:[id number] DOB: 19-Aug-1960 DOA: 09/02/2020  PCP: Lorrene Reid, PA-C   Patient coming from: Home  Chief Complaint: Fall, diarrhea  HPI: Alejandra Taylor is a 60 y.o. female with medical history significant of CKD 2, depression, anxiety, PTSD, diabetes, endometrial cancer in 2011, gastritis, hyperlipidemia, hypertension, hypothyroidism, migraine, chronic back pain, colitis presents with 4 days of diarrhea and a fall 4 days ago with hip pain.  Patient began to experience diarrhea on Sunday and she had a fall when attempting to get to the bathroom on Monday night.  She thinks that the fall was due in part to her being drowsier after taking her evening Seroquel.  She also reports that she fell on her right hip and landed on a metal vent.  Her hip pain has okay if not bearing weight but has been persistent with weightbearing.  She is also had decreased ability to ambulate and has required significant assistance from her family to get around.  Her diarrhea has also been persistent with about 4 episodes a day.  She feels dehydrated.  Diarrhea has been nonbloody.  She denies fevers, chills, chest pain, shortness of breath, abdominal pain, nausea, vomiting.   ED Course: Vital signs in the ED significant for initial blood pressure soft in the 27O systolic with improvement to the 120s to 130s after IV fluids.  Lab work-up showed BMP with sodium 132 which is stable, chloride 84 which is stable, potassium 2.4, BUN 21, creatinine 1.37 up from baseline of 0.9, glucose 162, gap 26.  CBC showed leukocytosis to 17.  PT and INR within normal notes.  Pending labs include type and screen, C. difficile panel, respiratory panel for flu and COVID.  Chest x-ray showed no acute normality, x-ray of the right pelvis showed displaced and comminuted fracture of the right hip.  CT abdomen pelvis showed no acute abnormality and redemonstrated the right hip fracture.  Patient received 40  mEq p.o. potassium, 20 mEq IV potassium, 3 L IV bolus, morphine in the ED.  Review of Systems: As per HPI otherwise all other systems reviewed and are negative.  Past Medical History:  Diagnosis Date  . Chronic back pain   . Colitis   . Diabetes mellitus   . 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 History of degenerative disc disease   . Hypercholesterolemia   . Hypertension   . Hyperthyroidism   . Migraines   . Post traumatic stress disorder   . Spinal stenosis     Past Surgical History:  Procedure Laterality Date  . ABDOMINAL HYSTERECTOMY    . CHOLECYSTECTOMY    . GANGLION CYST EXCISION     L wrist cyst removal with tendon rupture and repair  . ROBOTIC ASSISTED LAP VAGINAL HYSTERECTOMY  08/19/2009   BSO for endo ca  . TONSILLECTOMY      Social History  reports that she has been smoking cigarettes. She has a 15.00 pack-year smoking history. She has never used smokeless tobacco. She reports current drug use. Drug: Marijuana. She reports that she does not drink alcohol.  Allergies  Allergen Reactions  . Other Hives    neoprene  . Penicillins Rash    Family History  Problem Relation Age of Onset  . Hypertension Father   . Diabetes Father   . Heart attack Father   . Pancreatic cancer Father   . Colon cancer Maternal Aunt   . Ovarian cancer Maternal Aunt   . Ovarian cancer Paternal Grandmother   .  Stroke Paternal Grandmother   . Diabetes Paternal Grandmother   . Ovarian cancer Cousin   . Breast cancer Cousin   Reviewed on admission  Prior to Admission medications   Medication Sig Start Date End Date Taking? Authorizing Provider  clonazePAM (KLONOPIN) 1 MG tablet Take 1 mg by mouth 3 (three) times daily as needed for anxiety. 03/08/15  Yes [provider]  ezetimibe (ZETIA) 10 MG tablet TAKE 1 TABLET (10 MG TOTAL) BY MOUTH DAILY. **NEEDS APT FOR REFILLS** 08/28/20  Yes Abonza, Maritza, PA-C  furosemide (LASIX) 20 MG tablet TAKE 1 TABLET (20 MG TOTAL) BY  MOUTH DAILY. **NEEDS APT FOR REFILLS** 08/28/20  Yes Abonza, Maritza, PA-C  levothyroxine (SYNTHROID) 125 MCG tablet Take 1 tablet (125 mcg total) by mouth daily. **NEEDS APT FOR REFILLS** 05/27/20  Yes Abonza, Maritza, PA-C  LINZESS 72 MCG capsule TAKE 1 CAPSULE (72 MCG TOTAL) BY MOUTH DAILY BEFORE BREAKFAST. 05/18/20  Yes Abonza, Maritza, PA-C  lisinopril (ZESTRIL) 20 MG tablet TAKE 1 TABLET (20 MG TOTAL) BY MOUTH IN THE MORNING AND AT BEDTIME. **NEEDS APT FOR REFILLS** 08/28/20  Yes Abonza, Maritza, PA-C  metFORMIN (GLUCOPHAGE) 1000 MG tablet TAKE 1 TABLET (1,000 MG TOTAL) BY MOUTH 2 (TWO) TIMES DAILY WITH A MEAL. **NEEDS APT FOR REFILLS** 08/21/20  Yes Abonza, Maritza, PA-C  pantoprazole (PROTONIX) 20 MG tablet *NEED OFFICE VISIT* TAKE 1 TABLET BY MOUTH EVERY DAY 08/17/20  Yes Abonza, Maritza, PA-C  QUEtiapine (SEROQUEL) 300 MG tablet Take 300 mg by mouth at bedtime.   Yes [provider]  simvastatin (ZOCOR) 40 MG tablet Take 1 tablet (40 mg total) by mouth daily at 6 PM. **NEEDS APT FOR REFILLS** 08/09/20  Yes Abonza, Maritza, PA-C  topiramate (TOPAMAX) 100 MG tablet Take 1 tablet (100 mg total) by mouth at bedtime. Max dose is 171m at bedtime 02/10/20  Yes Abonza, Maritza, PA-C  venlafaxine XR (EFFEXOR-XR) 150 MG 24 hr capsule Take 1 capsule every morning and 1 capsule at night Patient taking differently: Take 300 mg by mouth daily with breakfast. 03/09/19  Yes Danford, KValetta FullerD, NP  albuterol (PROVENTIL HFA;VENTOLIN HFA) 108 (90 Base) MCG/ACT inhaler Inhale 2 puffs into the lungs every 6 (six) hours as needed for wheezing or shortness of breath. 06/20/18   Danford, KValetta FullerD, NP  azithromycin (ZITHROMAX) 250 MG tablet Take 2 tablets (5037m on day 1 then 1 tablet (25093mdaily on days 2-5. Patient not taking: No sig reported 03/08/20   JesSamuel BoucheP  benzonatate (TESSALON) 100 MG capsule Take 1 capsule (100 mg total) by mouth 3 (three) times daily as needed for cough. Patient not taking: No sig  reported 03/08/20   JesSamuel BoucheP  cyclobenzaprine (FLEXERIL) 10 MG tablet Take 1 tablet by mouth 4 (four) times daily as needed for muscle spasms. 01/22/19   [provider]  glucose blood test strip 1 each by Other route as needed. Use as instructed    [provider]  guaiFENesin-codeine (ROBITUSSIN AC) 100-10 MG/5ML syrup Take 5 mLs by mouth 3 (three) times daily as needed for cough. Patient not taking: No sig reported 03/08/20   JesSamuel BoucheP  [facility] Hospital[facility]/0.1ML LIQD nasal spray kit Place 1 spray into the nose once. PRN Overdose 04/18/17   [provider]  tiZANidine (ZANAFLEX) 2 MG capsule TAKE 1 CAP AS NEEDED ONCE A DAY FOR MUSCLE SPASM Patient not taking: Reported on 09/02/2020 03/18/18   DanEsaw GrandchildP    Physical Exam:  Vitals:   09/02/20 1730 09/02/20 1745 09/02/20 1800 09/02/20 1946  BP: 125/69 123/69  (!) 142/106  Pulse: 77 74 76 76  Resp: 12 16 (!) 23 16  Temp:      TempSrc:      SpO2: 98% 99% 98% 98%  Weight:      Height:       Physical Exam Constitutional:      General: She is not in acute distress.    Appearance: Normal appearance.  HENT:     Head: Normocephalic and atraumatic.     Mouth/Throat:     Mouth: Mucous membranes are moist.     Pharynx: Oropharynx is clear.  Eyes:     Extraocular Movements: Extraocular movements intact.     Pupils: Pupils are equal, round, and reactive to light.  Cardiovascular:     Rate and Rhythm: Normal rate and regular rhythm.     Pulses: Normal pulses.     Heart sounds: Normal heart sounds.  Pulmonary:     Effort: Pulmonary effort is normal. No respiratory distress.     Breath sounds: Normal breath sounds.  Abdominal:     General: There is no distension.     Palpations: Abdomen is soft.     Tenderness: There is no abdominal tenderness.     Comments: Increased bowel sounds  Musculoskeletal:        General: No swelling or deformity.     Comments: Right lower extremity is neurovascularly intact.   Skin:    General: Skin is warm and dry.  Neurological:     General: No focal deficit present.     Mental Status: Mental status is at baseline.    Labs on Admission: I have personally reviewed following labs and imaging studies  CBC: Recent Labs  Lab 09/02/20 1545  WBC 17.1*  NEUTROABS 14.8*  HGB 14.6  HCT 39.9  MCV 89.3  PLT 132    Basic Metabolic Panel: Recent Labs  Lab 09/02/20 1545  NA 132*  K 2.4*  CL 84*  CO2 22  GLUCOSE 162*  BUN 21*  CREATININE 1.37*  CALCIUM 9.2    GFR: Estimated Creatinine Clearance: 42.5 mL/min (A) (by C-G formula based on SCr of 1.37 mg/dL (H)).  Liver Function Tests: No results for input(s): AST, ALT, ALKPHOS, BILITOT, PROT, ALBUMIN in the last 168 hours.  Urine analysis:    Component Value Date/Time   COLORURINE YELLOW 05/23/2017 2150   APPEARANCEUR CLOUDY (A) 05/23/2017 2150   LABSPEC 1.014 05/23/2017 2150   PHURINE 5.0 05/23/2017 2150   GLUCOSEU 150 (A) 05/23/2017 2150   HGBUR MODERATE (A) 05/23/2017 2150   BILIRUBINUR NEGATIVE 05/23/2017 2150   KETONESUR NEGATIVE 05/23/2017 2150   PROTEINUR NEGATIVE 05/23/2017 2150   NITRITE NEGATIVE 05/23/2017 2150   LEUKOCYTESUR MODERATE (A) 05/23/2017 2150    Radiological Exams on Admission: CT Abdomen Pelvis W Contrast  Result Date: 09/02/2020 CLINICAL DATA:  Abdominal pain and diarrhea since Monday. Patient fell. EXAM: CT ABDOMEN AND PELVIS WITH CONTRAST TECHNIQUE: Multidetector CT imaging of the abdomen and pelvis was performed using the standard protocol following bolus administration of intravenous contrast. CONTRAST:  164m OMNIPAQUE IOHEXOL 300 MG/ML  SOLN COMPARISON:  None. FINDINGS: Lower chest: The lung bases are clear of acute process. No pleural effusion or pulmonary lesions. Scattered age advanced coronary artery calcifications and aortic calcifications. The heart is normal in size. No pericardial effusion. The distal esophagus and aorta are unremarkable. Hepatobiliary: No  hepatic lesions or intrahepatic  biliary dilatation. The gallbladder is surgically absent. No common bile duct dilatation. Pancreas: Moderate pancreatic atrophy but no mass or inflammation. Spleen: Normal size.  No focal lesions. Adrenals/Urinary Tract: The adrenal glands and kidneys are unremarkable. No worrisome renal lesions or hydronephrosis. The bladder is unremarkable. Stomach/Bowel: The stomach, duodenum, small bowel and colon are unremarkable. No acute inflammatory changes, mass lesions or obstructive findings. The terminal ileum is normal. The appendix is normal. Vascular/Lymphatic: Age advanced atherosclerotic calcifications involving the aorta and iliac arteries. No aneurysm or dissection. The major venous structures are patent. No mesenteric or retroperitoneal mass or adenopathy. Reproductive: Surgically absent. Other: No pelvic mass or adenopathy. No free pelvic fluid collections. No inguinal mass or adenopathy. No abdominal wall hernia or subcutaneous lesions. Musculoskeletal: Comminuted intertrochanteric fracture of the right hip. Surrounding hematoma noted. IMPRESSION: 1. No acute abdominal/pelvic findings, mass lesions or adenopathy. 2. Status post cholecystectomy. No biliary dilatation. 3. Intertrochanteric fracture of the right hip. 4. Age advanced atherosclerotic calcifications involving the aorta and iliac arteries. Aortic Atherosclerosis (ICD10-I70.0). Electronically Signed   By: Marijo Sanes M.D.   On: 09/02/2020 19:45   DG Chest Port 1 View  Result Date: 09/02/2020 CLINICAL DATA:  [place].  Right hip pain. EXAM: PORTABLE CHEST 1 VIEW COMPARISON:  05/25/2017 FINDINGS: The cardiac silhouette, mediastinal and hilar contours are within normal limits and stable. No acute pulmonary findings. No pulmonary lesions. The bony thorax is intact.  Remote healed rib fractures are noted. IMPRESSION: No acute cardiopulmonary findings. Electronically Signed   By: Marijo Sanes M.D.   On: 09/02/2020 16:23    DG Hip Unilat With Pelvis 2-3 Views Right  Result Date: 09/02/2020 CLINICAL DATA:  [place] today.  Right hip pain. EXAM: DG HIP (WITH OR WITHOUT PELVIS) 2-3V RIGHT COMPARISON:  None. FINDINGS: There is a displaced and comminuted intertrochanteric fracture of the right hip. The left hip is intact.  No acute fracture. The bony pelvis is intact.  No pelvic fractures or bone lesions. IMPRESSION: Displaced and comminuted intertrochanteric fracture of the right hip. Electronically Signed   By: Marijo Sanes M.D.   On: 09/02/2020 16:22    EKG: Independently reviewed.  Sinus rhythm at 87 bpm.  Borderline QRS at 92.  Possible Q waves in 2 3 and aVF.  I cannot review previous EKGs however, comment on read noted similar to previous.  Assessment/Plan Principal Problem:   Intertrochanteric fracture of right femur (HCC) Active Problems:   Migraine with status migrainosus, not intractable   Hypertension associated with diabetes ([place])   PTSD (post-traumatic stress disorder)   Hypothyroidism   Hyperlipidemia associated with type 2 diabetes mellitus ([place])   Depression with anxiety   Acute renal failure superimposed on stage 2 chronic kidney disease (HCC)   Hyponatremia   Diabetes mellitus without complication (HCC)   Gastritis without bleeding   Diarrhea   Hypokalemia   Leukocytosis   Osteoporosis  Intertrochanteric fracture of the right femur Osteoporosis > Patient had a fall a try to get to the bathroom 4 days ago > Increasing pain and difficulty ambulating since that time > X-ray noted to have displaced and comminuted right intertrochanteric fracture > Ortho consulted in the ED and will plan for surgery tomorrow provided patient is medically stabilized (see below) > With fracture following fall from standing height patient's arterial for osteoporosis - Appreciate orthopedics assistance with this patient - As needed hydrocodone codon for moderate pain and Dilaudid for breakthrough severe pain -  Hip fracture protocol - N.p.o.  at midnight  Diarrhea Leukocytosis > 4 days of diarrhea consisting of about 4 stools a day.  Patient states that she has a history of colitis.  Leukocytosis to 17.  CT abdomen without significant evidence of colitis.  No fevers. > We will investigate alternative etiology for leukocytosis including UTI from recurrent diarrhea, there may be a component of hemoconcentration and reactive leukocytosis. - No antibiotics for now as we determine source of white blood cell count and would like to identify pathogen if present. - Check UA - Follow-up C. difficile panel ordered and add on GI pathogen panel with precautions - Continue with IV fluids and supportive care - Trend CBC  AKI on CKD 2 Hypokalemia Hyponatremia > In the setting of diarrhea as above. > Received 3 L of IV fluid in the ED as well as 40 mEq p.o. potassium and 20 mEq IV potassium. > Creatinine elevated to 1.37 from baseline of around 0.9. > Sodium at 132 in the setting of volume down, this is similar to previous labs that were some time ago. - Monitor on telemetry - Add additional 40 mEq IV potassium - Check magnesium - Continue IV fluids at 125 cc/h - Trend renal function and electrolytes  Depression/anxiety/PTSD - Continue home venlafaxine and Seroquel - Continue as needed Klonopin  Diabetes > Metformin at home - SSI  Hypertension > Blood pressures initially in the 89F systolic but with fluids have improved to the 810F to 751W systolic. - Holding home lisinopril and Lasix in the setting of AKI and hypokalemia as above  Hyperlipidemia - Continue home statin and Zetia  Hypothyroidism - Continue home Synthroid  Migraine - Continue home Topamax  Chronic back pain - Will be receiving pain medication for hip fracture as above.  Gastritis - Continue PPI  DVT prophylaxis: SCDs  Code Status:   Full  Family Communication:  Mother on speaker phone when interviewing patient   Disposition Plan:   Patient is from:  Home  Anticipated DC to:  Pending PT evaluation after surgery  Anticipated DC date:  2 to 5 days  Anticipated DC barriers: None  Consults called:  Ortho consulted by EDP.   Admission status:  Inpatient, telemetry   Severity of Illness: The appropriate patient status for this patient is INPATIENT. Inpatient status is judged to be reasonable and necessary in order to provide the required intensity of service to ensure the patient's safety. The patient's presenting symptoms, physical exam findings, and initial radiographic and laboratory data in the context of their chronic comorbidities is felt to place them at high risk for further clinical deterioration. Furthermore, it is not anticipated that the patient will be medically stable for discharge from the hospital within 2 midnights of admission. The following factors support the patient status of inpatient.   " The patient's presenting symptoms include diarrhea, hip pain. " The worrisome physical exam findings increased bowel sounds, initial soft blood pressure. " The initial radiographic and laboratory data are worrisome because of x-ray demonstrating displaced and comminuted right intertrochanteric hip fracture.  Sodium 132, potassium 2.4, creatinine 1.37 up from baseline 0.9.  Leukocytosis 17.. " The chronic co-morbidities include CKD, depression, anxiety, PTSD, diabetes, gastritis, hyperlipidemia, hypertension, hypothyroidism, migraine, chronic back pain, colitis.   * I certify that at the point of admission it is my clinical judgment that the patient will require inpatient hospital care spanning beyond 2 midnights from the point of admission due to high intensity of service, high risk for further deterioration and  high frequency of surveillance required.Marcelyn Bruins MD [facility]  How to contact the [facility] Attending or Consulting provider [place] or covering provider during after hours  [place], for this patient?   1. Check the care team in [facility] and look for a) attending/consulting [facility] provider listed and b) the [facility] team listed 2. Log into [email or web address] and use [place] universal password to access. If you do not have the password, please contact the hospital operator. 3. Locate the [facility] provider you are looking for under [facility] and page to a number that you can be directly reached. 4. If you still have difficulty reaching the provider, please page the [facility] (Director on Call) for the Hospitalists listed on amion for assistance.  09/02/2020, 9:01 PM

## 2020-09-02 NOTE — ED Provider Notes (Signed)
Care was taken over from Dr. Tomi Bamberger.  Patient had a fall on Monday which has resulted in a right hip fracture.  X-rays demonstrate a right intertrochanteric hip fracture.  She does not have any other apparent injuries from the fall.  She has been having some diarrhea.  She had an elevated WBC count.  She does not have associated abdominal pain but given the WBC count, CT was performed which showed no acute abnormalities other than the hip fracture.  No evidence of colitis or diverticulitis on CT.  Her potassium is low at 2.4 and replacement was started both IV and oral forms in the ED.  She was also given IV fluids.  She has previously been seen by Dr. Rhona Raider with [facility].  Dr. Berenice Primas is on-call and will plan on doing surgery tomorrow.  She will need to remain n.p.o. after midnight for that.  I spoke with the hospitalist who admit the patient for further treatment.  Results for orders placed or performed during the hospital encounter of 98/11/91  Basic metabolic panel  Result Value Ref Range   Sodium 132 (L) 135 - 145 mmol/L   Potassium 2.4 (LL) 3.5 - 5.1 mmol/L   Chloride 84 (L) 98 - 111 mmol/L   CO2 22 22 - 32 mmol/L   Glucose, Bld 162 (H) 70 - 99 mg/dL   BUN 21 (H) 6 - 20 mg/dL   Creatinine, Ser 1.37 (H) 0.44 - 1.00 mg/dL   Calcium 9.2 8.9 - 10.3 mg/dL   GFR, Estimated 44 (L) >60 mL/min   Anion gap 26 (H) 5 - 15  CBC WITH DIFFERENTIAL  Result Value Ref Range   WBC 17.1 (H) 4.0 - 10.5 K/uL   RBC 4.47 3.87 - 5.11 MIL/uL   Hemoglobin 14.6 12.0 - 15.0 g/dL   HCT 39.9 36.0 - 46.0 %   MCV 89.3 80.0 - 100.0 fL   MCH 32.7 26.0 - 34.0 pg   MCHC 36.6 (H) 30.0 - 36.0 g/dL   RDW 12.1 11.5 - 15.5 %   Platelets 310 150 - 400 K/uL   nRBC 0.0 0.0 - 0.2 %   Neutrophils Relative % 86 %   Neutro Abs 14.8 (H) 1.7 - 7.7 K/uL   Lymphocytes Relative 7 %   Lymphs Abs 1.1 0.7 - 4.0 K/uL   Monocytes Relative 6 %   Monocytes Absolute 1.1 (H) 0.1 - 1.0 K/uL   Eosinophils Relative 0 %    Eosinophils Absolute 0.0 0.0 - 0.5 K/uL   Basophils Relative 0 %   Basophils Absolute 0.1 0.0 - 0.1 K/uL   Immature Granulocytes 1 %   Abs Immature Granulocytes 0.08 (H) 0.00 - 0.07 K/uL  Protime-INR  Result Value Ref Range   Prothrombin Time 13.8 11.4 - 15.2 seconds   INR 1.1 0.8 - 1.2  Type and screen [place]  Result Value Ref Range   ABO/RH(D) AB NEG    Antibody Screen NEG    Sample Expiration      09/05/2020,2359 Performed at [facility], [place] [address] [place], [place] [address]    CT Abdomen Pelvis W Contrast  Result Date: 09/02/2020 CLINICAL DATA:  Abdominal pain and diarrhea since Monday. Patient fell. EXAM: CT ABDOMEN AND PELVIS WITH CONTRAST TECHNIQUE: Multidetector CT imaging of the abdomen and pelvis was performed using the standard protocol following bolus administration of intravenous contrast. CONTRAST:  154mL OMNIPAQUE IOHEXOL 300 MG/ML  SOLN COMPARISON:  None. FINDINGS: Lower chest:  The lung bases are clear of acute process. No pleural effusion or pulmonary lesions. Scattered age advanced coronary artery calcifications and aortic calcifications. The heart is normal in size. No pericardial effusion. The distal esophagus and aorta are unremarkable. Hepatobiliary: No hepatic lesions or intrahepatic biliary dilatation. The gallbladder is surgically absent. No common bile duct dilatation. Pancreas: Moderate pancreatic atrophy but no mass or inflammation. Spleen: Normal size.  No focal lesions. Adrenals/Urinary Tract: The adrenal glands and kidneys are unremarkable. No worrisome renal lesions or hydronephrosis. The bladder is unremarkable. Stomach/Bowel: The stomach, duodenum, small bowel and colon are unremarkable. No acute inflammatory changes, mass lesions or obstructive findings. The terminal ileum is normal. The appendix is normal. Vascular/Lymphatic: Age advanced atherosclerotic calcifications involving the aorta and iliac arteries. No  aneurysm or dissection. The major venous structures are patent. No mesenteric or retroperitoneal mass or adenopathy. Reproductive: Surgically absent. Other: No pelvic mass or adenopathy. No free pelvic fluid collections. No inguinal mass or adenopathy. No abdominal wall hernia or subcutaneous lesions. Musculoskeletal: Comminuted intertrochanteric fracture of the right hip. Surrounding hematoma noted. IMPRESSION: 1. No acute abdominal/pelvic findings, mass lesions or adenopathy. 2. Status post cholecystectomy. No biliary dilatation. 3. Intertrochanteric fracture of the right hip. 4. Age advanced atherosclerotic calcifications involving the aorta and iliac arteries. Aortic Atherosclerosis (ICD10-I70.0). Electronically Signed   By: Marijo Sanes M.D.   On: 09/02/2020 19:45   DG Chest Port 1 View  Result Date: 09/02/2020 CLINICAL DATA:  [place].  Right hip pain. EXAM: PORTABLE CHEST 1 VIEW COMPARISON:  05/25/2017 FINDINGS: The cardiac silhouette, mediastinal and hilar contours are within normal limits and stable. No acute pulmonary findings. No pulmonary lesions. The bony thorax is intact.  Remote healed rib fractures are noted. IMPRESSION: No acute cardiopulmonary findings. Electronically Signed   By: Marijo Sanes M.D.   On: 09/02/2020 16:23   DG Hip Unilat With Pelvis 2-3 Views Right  Result Date: 09/02/2020 CLINICAL DATA:  [place] today.  Right hip pain. EXAM: DG HIP (WITH OR WITHOUT PELVIS) 2-3V RIGHT COMPARISON:  None. FINDINGS: There is a displaced and comminuted intertrochanteric fracture of the right hip. The left hip is intact.  No acute fracture. The bony pelvis is intact.  No pelvic fractures or bone lesions. IMPRESSION: Displaced and comminuted intertrochanteric fracture of the right hip. Electronically Signed   By: Marijo Sanes M.D.   On: 09/02/2020 16:22      Malvin Johns, MD 09/02/20 2015

## 2020-09-02 NOTE — ED Provider Notes (Signed)
[facility] DEPT Provider Note   CSN: [id number] Arrival date & time: 09/02/20  1348     History No chief complaint on file.   Alejandra Taylor is a 60 y.o. female.  HPI   Pt started having diarrhea on Monday.  She had to get up on the middle of the night.  She ended up falling after going to the bathroom and landed on her left hip.  She takes seroquel at night and it makes her very sleepy so she thinks that contributed to it.  She continues to have pain in her hip, and it has not gotten any better.  She also has continued to have diarrhea.  She has been having at least 4 episodes per day.  No pain in the abdomen.  No abx recently.  Her son has been having to carry her to the bathroom.  Pt has had colitis in the past and this feels similar.  Pt feels very dehydrated.  Her mouth feels very dry.  Past Medical History:  Diagnosis Date  . Chronic back pain   . Colitis   . Diabetes mellitus   . 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 History of degenerative disc disease   . Hypercholesterolemia   . Hypertension   . Hyperthyroidism   . Migraines   . Post traumatic stress disorder   . Spinal stenosis     Patient Active Problem List   Diagnosis Date Noted  . Diabetic nephropathy associated with type 2 diabetes mellitus ([place]) 09/21/2019  . CKD (chronic kidney disease), stage II 09/21/2019  . Gastritis without bleeding 09/21/2019  . Elevated alanine aminotransferase (ALT) level 09/21/2019  . Hyperplastic polyp of cecum 09/21/2019  . 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 Urinary incontinence 12/11/2017  . Constipation due to pain medication therapy 12/11/2017  . Need for hepatitis C screening test 12/11/2017  . Diabetes mellitus without complication ([place]) 37/03/6268  . Healthcare maintenance 06/10/2017  . Colitis 05/24/2017  . Tobacco abuse 05/24/2017  . UTI (urinary tract infection) 05/24/2017  . Depression with anxiety 05/24/2017  . AKI  (acute kidney injury) ([place]) 05/24/2017  . Hyponatremia 05/24/2017  . Migraine with status migrainosus, not intractable 04/17/2017  . Other chronic pain 04/17/2017  . Hypertension associated with diabetes ([place]) 04/17/2017  . PTSD (post-traumatic stress disorder) 04/17/2017  . Hypothyroidism 04/17/2017  . Hyperlipidemia associated with type 2 diabetes mellitus ([place]) 04/17/2017  . Endometrial cancer ([place]) 01/22/2012    Past Surgical History:  Procedure Laterality Date  . ABDOMINAL HYSTERECTOMY    . CHOLECYSTECTOMY    . GANGLION CYST EXCISION     L wrist cyst removal with tendon rupture and repair  . ROBOTIC ASSISTED LAP VAGINAL HYSTERECTOMY  08/19/2009   BSO for endo ca  . TONSILLECTOMY       OB History   No obstetric history on file.     Family History  Problem Relation Age of Onset  . Hypertension Father   . Diabetes Father   . Heart attack Father   . Pancreatic cancer Father   . Colon cancer Maternal Aunt   . Ovarian cancer Maternal Aunt   . Ovarian cancer Paternal Grandmother   . Stroke Paternal Grandmother   . Diabetes Paternal Grandmother   . Ovarian cancer Cousin   . Breast cancer Cousin     Social History   Tobacco Use  . Smoking status: Current Every Day Smoker    Packs/day: 1.00    Years: 30.00  Pack years: 30.00    Types: Cigarettes  . Smokeless tobacco: Never Used  Vaping Use  . Vaping Use: Never used  Substance Use Topics  . Alcohol use: No  . Drug use: No    Home Medications Prior to Admission medications   Medication Sig Start Date End Date Taking? Authorizing Provider  albuterol (PROVENTIL HFA;VENTOLIN HFA) 108 (90 Base) MCG/ACT inhaler Inhale 2 puffs into the lungs every 6 (six) hours as needed for wheezing or shortness of breath. 06/20/18   Danford, Valetta Fuller D, NP  azithromycin (ZITHROMAX) 250 MG tablet Take 2 tablets (512m) on day 1 then 1 tablet (2578m daily on days 2-5. 03/08/20   JeSamuel BoucheNP  benzonatate (TESSALON) 100 MG capsule  Take 1 capsule (100 mg total) by mouth 3 (three) times daily as needed for cough. 03/08/20   JeSamuel BoucheNP  clonazePAM (KLONOPIN) 1 MG tablet Take 1 mg by mouth 3 (three) times daily. 03/08/15   [provider]  cyclobenzaprine (FLEXERIL) 10 MG tablet Take 1 tablet by mouth 4 (four) times daily as needed. 01/22/19   [provider]  estazolam (PROSOM) 2 MG tablet TAKE 1 TABLET AT BEDTIME AS NEEDED FOR SLEEP MAY REPEAT ONCE AS NEEDED ONLY IF MID-AWAKENING 03/11/15   [provider]  ezetimibe (ZETIA) 10 MG tablet TAKE 1 TABLET (10 MG TOTAL) BY MOUTH DAILY. **NEEDS APT FOR REFILLS** 08/28/20   Abonza, Maritza, PA-C  furosemide (LASIX) 20 MG tablet TAKE 1 TABLET (20 MG TOTAL) BY MOUTH DAILY. **NEEDS APT FOR REFILLS** 08/28/20   Abonza, Maritza, PA-C  glucose blood test strip 1 each by Other route as needed. Use as instructed    [provider]  guaiFENesin-codeine (ROBITUSSIN AC) 100-10 MG/5ML syrup Take 5 mLs by mouth 3 (three) times daily as needed for cough. 03/08/20   JeSamuel BoucheNP  HYDROcodone-acetaminophen (NORCO) 10-325 MG tablet TAKE 1 OR 2 TABLETS BY MOUTH EVERY 6 HOURS AS NEEDED FOR PAIN MUST LAST 30 DAYS Patient not taking: Reported on 03/08/2020 03/16/15   [provider]  levothyroxine (SYNTHROID) 125 MCG tablet Take 1 tablet (125 mcg total) by mouth daily. **NEEDS APT FOR REFILLS** 05/27/20   Abonza, Maritza, PA-C  LINZESS 72 MCG capsule TAKE 1 CAPSULE (72 MCG TOTAL) BY MOUTH DAILY BEFORE BREAKFAST. 05/18/20   Abonza, Maritza, PA-C  lisinopril (ZESTRIL) 20 MG tablet TAKE 1 TABLET (20 MG TOTAL) BY MOUTH IN THE MORNING AND AT BEDTIME. **NEEDS APT FOR REFILLS** 08/28/20   Abonza, Maritza, PA-C  metFORMIN (GLUCOPHAGE) 1000 MG tablet TAKE 1 TABLET (1,000 MG TOTAL) BY MOUTH 2 (TWO) TIMES DAILY WITH A MEAL. **NEEDS APT FOR REFILLS** 08/21/20   Abonza, Maritza, PA-C  NARCAN 4 MG/0.1ML LIQD nasal spray kit 1 AS NEEDED FOR OVERDOSE 04/18/17   [provider]   pantoprazole (PROTONIX) 20 MG tablet *NEED OFFICE VISIT* TAKE 1 TABLET BY MOUTH EVERY DAY 08/17/20   Abonza, Maritza, PA-C  QUEtiapine (SEROQUEL) 300 MG tablet Take 300 mg by mouth at bedtime.    [provider]  simvastatin (ZOCOR) 40 MG tablet Take 1 tablet (40 mg total) by mouth daily at 6 PM. **NEEDS APT FOR REFILLS** 08/09/20   Abonza, Maritza, PA-C  tiZANidine (ZANAFLEX) 2 MG capsule TAKE 1 CAP AS NEEDED ONCE A DAY FOR MUSCLE SPASM 03/18/18   Danford, KaValetta Fuller, NP  topiramate (TOPAMAX) 100 MG tablet Take 1 tablet (100 mg total) by mouth at bedtime. Max dose is 10062mt bedtime 02/10/20  Lorrene Reid, PA-C  venlafaxine XR (EFFEXOR-XR) 150 MG 24 hr capsule Take 1 capsule every morning and 1 capsule at night 03/09/19   Danford, Valetta Fuller D, NP    Allergies    Other and Penicillins  Review of Systems   Review of Systems  All other systems reviewed and are negative.   Physical Exam Updated Vital Signs There were no vitals taken for this visit.  Physical Exam Vitals and nursing note reviewed.  Constitutional:      General: She is not in acute distress.    Appearance: She is well-developed.  HENT:     Head: Normocephalic and atraumatic.     Right Ear: External ear normal.     Left Ear: External ear normal.  Eyes:     General: No scleral icterus.       Right eye: No discharge.        Left eye: No discharge.     Conjunctiva/sclera: Conjunctivae normal.  Neck:     Trachea: No tracheal deviation.  Cardiovascular:     Rate and Rhythm: Normal rate and regular rhythm.  Pulmonary:     Effort: Pulmonary effort is normal. No respiratory distress.     Breath sounds: Normal breath sounds. No stridor. No wheezing or rales.  Abdominal:     General: Bowel sounds are normal. There is no distension.     Palpations: Abdomen is soft.     Tenderness: There is no abdominal tenderness. There is no guarding or rebound.  Musculoskeletal:        General: Tenderness present.     Cervical back:  Neck supple.     Right hip: Tenderness and bony tenderness present.     Comments: Pain with range of motion, shortened rle  Skin:    General: Skin is warm and dry.     Findings: No rash.  Neurological:     Mental Status: She is alert.     Cranial Nerves: No cranial nerve deficit (no facial droop, extraocular movements intact, no slurred speech).     Sensory: No sensory deficit.     Motor: No abnormal muscle tone or seizure activity.     Coordination: Coordination normal.     ED Results / Procedures / Treatments   pending Procedures Procedures   Medications Ordered in ED Medications - No data to display  ED Course  I have reviewed the triage vital signs and the nursing notes.  Pertinent labs & imaging results that were available during my care of the patient were reviewed by me and considered in my medical decision making (see chart for details).    MDM Rules/Calculators/A&P                          Patient presented to the ED after a fall as well as frequent diarrhea.  No abdominal tenderness on exam.  Right hip is tender and there is shortening of the right lower extremity.  My Preliminary review  suggests fracture.  Non toxic , likely dehydrated. Labs and formal studies pending.  Care turned over to Dr Tamera Punt.  Anticipate need for admission for treatment of hip fracture Final Clinical Impression(s) / ED Diagnoses pending   Dorie Rank, MD 09/03/20 [phone]

## 2020-09-02 NOTE — Progress Notes (Signed)
Patient ID: Alejandra Taylor, female   DOB: 07-Nov-1960, 60 y.o.   MRN: [id number] I have reviewed the patient's x-rays.  She has an intertrochanteric hip fracture which will need internal fixation.  She will need clearance by the medical team based on her low potassium and other medical issues.  If she is cleared I can do her surgery tomorrow around 1 PM.  Otherwise I could do it Sunday.  I will follow along with you and I will do a full consult one I'm able to see her likely tomorrow morning.  Please keep her n.p.o. if we believe she will be ready for surgery tomorrow.  If there are any questions please don't hesitate to call me. Dorna Leitz  [phone]

## 2020-09-03 ENCOUNTER — Inpatient Hospital Stay (HOSPITAL_COMMUNITY): Payer: Medicare HMO | Admitting: Certified Registered"

## 2020-09-03 ENCOUNTER — Inpatient Hospital Stay (HOSPITAL_COMMUNITY): Payer: Medicare HMO

## 2020-09-03 ENCOUNTER — Encounter (HOSPITAL_COMMUNITY)
Admission: EM | Disposition: A | Discharge status: Home Health | Payer: Self-pay | Source: Home / Self Care | Attending: Internal Medicine

## 2020-09-03 ENCOUNTER — Encounter (HOSPITAL_COMMUNITY): Payer: Self-pay | Admitting: Internal Medicine

## 2020-09-03 DIAGNOSIS — N179 Acute kidney failure, unspecified: Secondary | ICD-10-CM

## 2020-09-03 DIAGNOSIS — E785 Hyperlipidemia, unspecified: Secondary | ICD-10-CM

## 2020-09-03 DIAGNOSIS — N182 Chronic kidney disease, stage 2 (mild): Secondary | ICD-10-CM

## 2020-09-03 DIAGNOSIS — E1169 Type 2 diabetes mellitus with other specified complication: Secondary | ICD-10-CM

## 2020-09-03 DIAGNOSIS — E876 Hypokalemia: Secondary | ICD-10-CM

## 2020-09-03 DIAGNOSIS — R197 Diarrhea, unspecified: Secondary | ICD-10-CM

## 2020-09-03 DIAGNOSIS — S72141A Displaced intertrochanteric fracture of right femur, initial encounter for closed fracture: Secondary | ICD-10-CM | POA: Diagnosis present

## 2020-09-03 DIAGNOSIS — S72141D Displaced intertrochanteric fracture of right femur, subsequent encounter for closed fracture with routine healing: Secondary | ICD-10-CM

## 2020-09-03 HISTORY — PX: FEMUR IM NAIL: SHX1597

## 2020-09-03 LAB — CBC
HCT: 32.4 % — ABNORMAL LOW (ref 36.0–46.0)
Hemoglobin: 11.4 g/dL — ABNORMAL LOW (ref 12.0–15.0)
MCH: 32.1 pg (ref 26.0–34.0)
MCHC: 35.2 g/dL (ref 30.0–36.0)
MCV: 91.3 fL (ref 80.0–100.0)
Platelets: 259 10*3/uL (ref 150–400)
RBC: 3.55 MIL/uL — ABNORMAL LOW (ref 3.87–5.11)
RDW: 12.2 % (ref 11.5–15.5)
WBC: 9.9 10*3/uL (ref 4.0–10.5)
nRBC: 0 % (ref 0.0–0.2)

## 2020-09-03 LAB — GLUCOSE, CAPILLARY
Glucose-Capillary: 120 mg/dL — ABNORMAL HIGH (ref 70–99)
Glucose-Capillary: 145 mg/dL — ABNORMAL HIGH (ref 70–99)
Glucose-Capillary: 169 mg/dL — ABNORMAL HIGH (ref 70–99)
Glucose-Capillary: 221 mg/dL — ABNORMAL HIGH (ref 70–99)
Glucose-Capillary: 63 mg/dL — ABNORMAL LOW (ref 70–99)
Glucose-Capillary: 65 mg/dL — ABNORMAL LOW (ref 70–99)
Glucose-Capillary: 82 mg/dL (ref 70–99)
Glucose-Capillary: 94 mg/dL (ref 70–99)
Glucose-Capillary: 98 mg/dL (ref 70–99)

## 2020-09-03 LAB — BASIC METABOLIC PANEL
Anion gap: 12 (ref 5–15)
Anion gap: 10 (ref 5–15)
BUN: 13 mg/dL (ref 6–20)
BUN: 10 mg/dL (ref 6–20)
CO2: 26 mmol/L (ref 22–32)
CO2: 26 mmol/L (ref 22–32)
Calcium: 7.9 mg/dL — ABNORMAL LOW (ref 8.9–10.3)
Calcium: 8.2 mg/dL — ABNORMAL LOW (ref 8.9–10.3)
Chloride: 94 mmol/L — ABNORMAL LOW (ref 98–111)
Chloride: 99 mmol/L (ref 98–111)
Creatinine, Ser: 0.91 mg/dL (ref 0.44–1.00)
Creatinine, Ser: 0.82 mg/dL (ref 0.44–1.00)
GFR, Estimated: >60 mL/min (ref >60)
GFR, Estimated: >60 mL/min (ref >60)
Glucose, Bld: 59 mg/dL — ABNORMAL LOW (ref 70–99)
Glucose, Bld: 123 mg/dL — ABNORMAL HIGH (ref 70–99)
Potassium: 2.3 mmol/L — CL (ref 3.5–5.1)
Potassium: 3.4 mmol/L — ABNORMAL LOW (ref 3.5–5.1)
Sodium: 132 mmol/L — ABNORMAL LOW (ref 135–145)
Sodium: 135 mmol/L (ref 135–145)

## 2020-09-03 LAB — SARS CORONAVIRUS 2 (TAT 6-24 HRS): SARS Coronavirus 2: NEGATIVE

## 2020-09-03 LAB — HIV ANTIBODY (ROUTINE TESTING W REFLEX): HIV Screen 4th Generation wRfx: NONREACTIVE

## 2020-09-03 SURGERY — INSERTION, INTRAMEDULLARY ROD, FEMUR
Anesthesia: General | Site: Hip | Laterality: Right

## 2020-09-03 MED ORDER — MIDAZOLAM HCL 2 MG/2ML IJ SOLN
INTRAMUSCULAR | Status: AC
  Filled 2020-09-03: qty 2

## 2020-09-03 MED ORDER — ACETAMINOPHEN 10 MG/ML IV SOLN
INTRAVENOUS | Status: DC | PRN
  Administered 2020-09-03: 1000 mg via INTRAVENOUS

## 2020-09-03 MED ORDER — SUCCINYLCHOLINE CHLORIDE 200 MG/10ML IV SOSY
PREFILLED_SYRINGE | INTRAVENOUS | Status: AC
  Filled 2020-09-03: qty 10

## 2020-09-03 MED ORDER — POTASSIUM CHLORIDE CRYS ER 20 MEQ PO TBCR
40.0000 meq | EXTENDED_RELEASE_TABLET | Freq: Once | ORAL | Status: AC
  Administered 2020-09-03: 40 meq via ORAL
  Filled 2020-09-03: qty 2

## 2020-09-03 MED ORDER — CHLORHEXIDINE GLUCONATE CLOTH 2 % EX PADS
6.0000 | MEDICATED_PAD | Freq: Every day | CUTANEOUS | Status: DC
  Administered 2020-09-04 – 2020-09-05 (×2): 6 via TOPICAL

## 2020-09-03 MED ORDER — LACTATED RINGERS IV SOLN: INTRAVENOUS | Status: DC | PRN

## 2020-09-03 MED ORDER — DEXTROSE-NACL 5-0.9 % IV SOLN: INTRAVENOUS | Status: DC

## 2020-09-03 MED ORDER — POTASSIUM CHLORIDE 10 MEQ/100ML IV SOLN
10.0000 meq | INTRAVENOUS | Status: AC
  Administered 2020-09-03 (×3): 10 meq via INTRAVENOUS
  Filled 2020-09-03 (×3): qty 100

## 2020-09-03 MED ORDER — FENTANYL CITRATE (PF) 250 MCG/5ML IJ SOLN
INTRAMUSCULAR | Status: AC
  Filled 2020-09-03: qty 5

## 2020-09-03 MED ORDER — FENTANYL CITRATE (PF) 100 MCG/2ML IJ SOLN
25.0000 ug | INTRAMUSCULAR | Status: DC | PRN
Start: 2020-09-03 — End: 2020-09-03
  Administered 2020-09-03 (×5): 50 ug via INTRAVENOUS

## 2020-09-03 MED ORDER — DEXAMETHASONE SODIUM PHOSPHATE 10 MG/ML IJ SOLN
INTRAMUSCULAR | Status: AC
  Filled 2020-09-03: qty 1

## 2020-09-03 MED ORDER — ALBUMIN HUMAN 5 % IV SOLN: INTRAVENOUS | Status: DC | PRN

## 2020-09-03 MED ORDER — CHLORHEXIDINE GLUCONATE 4 % EX LIQD: 60.0000 mL | Freq: Once | CUTANEOUS | Status: DC

## 2020-09-03 MED ORDER — LIDOCAINE 2% (20 MG/ML) 5 ML SYRINGE
INTRAMUSCULAR | Status: AC
  Filled 2020-09-03: qty 5

## 2020-09-03 MED ORDER — ROCURONIUM BROMIDE 10 MG/ML (PF) SYRINGE
PREFILLED_SYRINGE | INTRAVENOUS | Status: AC
  Filled 2020-09-03: qty 10

## 2020-09-03 MED ORDER — PROPOFOL 1000 MG/100ML IV EMUL
INTRAVENOUS | Status: AC
  Filled 2020-09-03: qty 100

## 2020-09-03 MED ORDER — DEXTROSE-NACL 5-0.45 % IV SOLN: INTRAVENOUS | Status: DC

## 2020-09-03 MED ORDER — ALBUMIN HUMAN 5 % IV SOLN
INTRAVENOUS | Status: AC
  Filled 2020-09-03: qty 250

## 2020-09-03 MED ORDER — CEFAZOLIN SODIUM-DEXTROSE 2-3 GM-%(50ML) IV SOLR
INTRAVENOUS | Status: DC | PRN
  Administered 2020-09-03: 2 g via INTRAVENOUS

## 2020-09-03 MED ORDER — EPHEDRINE 5 MG/ML INJ
INTRAVENOUS | Status: AC
  Filled 2020-09-03: qty 10

## 2020-09-03 MED ORDER — PROPOFOL 10 MG/ML IV BOLUS
INTRAVENOUS | Status: DC | PRN
  Administered 2020-09-03: 150 mg via INTRAVENOUS

## 2020-09-03 MED ORDER — CEFAZOLIN SODIUM-DEXTROSE 2-4 GM/100ML-% IV SOLN
INTRAVENOUS | Status: AC
  Filled 2020-09-03: qty 100

## 2020-09-03 MED ORDER — FERROUS SULFATE 325 (65 FE) MG PO TABS
325.0000 mg | ORAL_TABLET | Freq: Two times a day (BID) | ORAL | Status: DC
  Administered 2020-09-03 – 2020-09-06 (×6): 325 mg via ORAL
  Filled 2020-09-03 (×6): qty 1

## 2020-09-03 MED ORDER — ONDANSETRON HCL 4 MG/2ML IJ SOLN: 4.0000 mg | Freq: Four times a day (QID) | INTRAMUSCULAR | Status: DC | PRN

## 2020-09-03 MED ORDER — PHENYLEPHRINE HCL-NACL 10-0.9 MG/250ML-% IV SOLN
INTRAVENOUS | Status: DC | PRN
  Administered 2020-09-03: 25 ug/min via INTRAVENOUS

## 2020-09-03 MED ORDER — LIDOCAINE 2% (20 MG/ML) 5 ML SYRINGE
INTRAMUSCULAR | Status: DC | PRN
  Administered 2020-09-03: 100 mg via INTRAVENOUS

## 2020-09-03 MED ORDER — QUETIAPINE FUMARATE 100 MG PO TABS
300.0000 mg | ORAL_TABLET | Freq: Every day | ORAL | Status: DC
  Administered 2020-09-03 – 2020-09-05 (×3): 300 mg via ORAL
  Filled 2020-09-03 (×3): qty 3

## 2020-09-03 MED ORDER — ASPIRIN EC 325 MG PO TBEC
325.0000 mg | DELAYED_RELEASE_TABLET | Freq: Every day | ORAL | Status: DC
  Administered 2020-09-04 – 2020-09-06 (×3): 325 mg via ORAL
  Filled 2020-09-03 (×3): qty 1

## 2020-09-03 MED ORDER — FENTANYL CITRATE (PF) 100 MCG/2ML IJ SOLN: 50.0000 ug | INTRAMUSCULAR | Status: DC | PRN

## 2020-09-03 MED ORDER — PHENYLEPHRINE 40 MCG/ML (10ML) SYRINGE FOR IV PUSH (FOR BLOOD PRESSURE SUPPORT)
PREFILLED_SYRINGE | INTRAVENOUS | Status: AC
  Filled 2020-09-03: qty 10

## 2020-09-03 MED ORDER — METOCLOPRAMIDE HCL 5 MG/ML IJ SOLN: 5.0000 mg | Freq: Three times a day (TID) | INTRAMUSCULAR | Status: DC | PRN

## 2020-09-03 MED ORDER — 0.9 % SODIUM CHLORIDE (POUR BTL) OPTIME
TOPICAL | Status: DC | PRN
  Administered 2020-09-03: 1000 mL

## 2020-09-03 MED ORDER — METHOCARBAMOL 500 MG PO TABS
500.0000 mg | ORAL_TABLET | Freq: Four times a day (QID) | ORAL | Status: DC | PRN
  Administered 2020-09-04 – 2020-09-06 (×6): 500 mg via ORAL
  Filled 2020-09-03 (×6): qty 1

## 2020-09-03 MED ORDER — ACETAMINOPHEN 10 MG/ML IV SOLN
INTRAVENOUS | Status: AC
  Filled 2020-09-03: qty 100

## 2020-09-03 MED ORDER — POVIDONE-IODINE 10 % EX SWAB: 2.0000 "application " | Freq: Once | CUTANEOUS | Status: DC

## 2020-09-03 MED ORDER — VANCOMYCIN HCL IN DEXTROSE 1-5 GM/200ML-% IV SOLN
1000.0000 mg | Freq: Two times a day (BID) | INTRAVENOUS | Status: AC
  Administered 2020-09-03: 1000 mg via INTRAVENOUS
  Filled 2020-09-03: qty 200

## 2020-09-03 MED ORDER — POTASSIUM CHLORIDE 10 MEQ/100ML IV SOLN
10.0000 meq | INTRAVENOUS | Status: AC
Start: 2020-09-03 — End: 2020-09-03
  Administered 2020-09-03 (×4): 10 meq via INTRAVENOUS
  Filled 2020-09-03 (×4): qty 100

## 2020-09-03 MED ORDER — AMISULPRIDE (ANTIEMETIC) 5 MG/2ML IV SOLN: 10.0000 mg | Freq: Once | INTRAVENOUS | Status: DC | PRN

## 2020-09-03 MED ORDER — MENTHOL 3 MG MT LOZG: 1.0000 | LOZENGE | OROMUCOSAL | Status: DC | PRN

## 2020-09-03 MED ORDER — POTASSIUM CHLORIDE 10 MEQ/100ML IV SOLN: 10.0000 meq | INTRAVENOUS | Status: AC

## 2020-09-03 MED ORDER — PHENYLEPHRINE 40 MCG/ML (10ML) SYRINGE FOR IV PUSH (FOR BLOOD PRESSURE SUPPORT)
PREFILLED_SYRINGE | INTRAVENOUS | Status: DC | PRN
  Administered 2020-09-03: 120 ug via INTRAVENOUS
  Administered 2020-09-03: 80 ug via INTRAVENOUS

## 2020-09-03 MED ORDER — CEFAZOLIN SODIUM-DEXTROSE 2-4 GM/100ML-% IV SOLN: 2.0000 g | INTRAVENOUS | Status: DC

## 2020-09-03 MED ORDER — ONDANSETRON HCL 4 MG PO TABS: 4.0000 mg | ORAL_TABLET | Freq: Four times a day (QID) | ORAL | Status: DC | PRN

## 2020-09-03 MED ORDER — PHENYLEPHRINE HCL (PRESSORS) 10 MG/ML IV SOLN
INTRAVENOUS | Status: AC
  Filled 2020-09-03: qty 1

## 2020-09-03 MED ORDER — PHENOL 1.4 % MT LIQD: 1.0000 | OROMUCOSAL | Status: DC | PRN

## 2020-09-03 MED ORDER — ONDANSETRON HCL 4 MG/2ML IJ SOLN
INTRAMUSCULAR | Status: DC | PRN
  Administered 2020-09-03: 4 mg via INTRAVENOUS

## 2020-09-03 MED ORDER — FENTANYL CITRATE (PF) 100 MCG/2ML IJ SOLN
INTRAMUSCULAR | Status: AC
  Filled 2020-09-03: qty 2

## 2020-09-03 MED ORDER — PROPOFOL 500 MG/50ML IV EMUL
INTRAVENOUS | Status: AC
  Filled 2020-09-03: qty 50

## 2020-09-03 MED ORDER — METOCLOPRAMIDE HCL 5 MG PO TABS
5.0000 mg | ORAL_TABLET | Freq: Three times a day (TID) | ORAL | Status: DC | PRN
  Filled 2020-09-03: qty 2

## 2020-09-03 MED ORDER — MIDAZOLAM HCL 2 MG/2ML IJ SOLN
INTRAMUSCULAR | Status: DC | PRN
  Administered 2020-09-03 (×2): 1 mg via INTRAVENOUS

## 2020-09-03 MED ORDER — ONDANSETRON HCL 4 MG/2ML IJ SOLN
INTRAMUSCULAR | Status: AC
  Filled 2020-09-03: qty 2

## 2020-09-03 MED ORDER — ACETAMINOPHEN 325 MG PO TABS: 325.0000 mg | ORAL_TABLET | Freq: Four times a day (QID) | ORAL | Status: DC | PRN

## 2020-09-03 MED ORDER — DOCUSATE SODIUM 100 MG PO CAPS
100.0000 mg | ORAL_CAPSULE | Freq: Two times a day (BID) | ORAL | Status: DC
  Administered 2020-09-04 – 2020-09-05 (×2): 100 mg via ORAL
  Filled 2020-09-03 (×4): qty 1

## 2020-09-03 MED ORDER — FENTANYL CITRATE (PF) 100 MCG/2ML IJ SOLN
INTRAMUSCULAR | Status: AC
  Filled 2020-09-03: qty 4

## 2020-09-03 MED ORDER — FENTANYL CITRATE (PF) 250 MCG/5ML IJ SOLN
INTRAMUSCULAR | Status: DC | PRN
  Administered 2020-09-03 (×10): 50 ug via INTRAVENOUS

## 2020-09-03 MED ORDER — PROPOFOL 10 MG/ML IV BOLUS
INTRAVENOUS | Status: AC
  Filled 2020-09-03: qty 20

## 2020-09-03 MED ORDER — ALUM & MAG HYDROXIDE-SIMETH 200-200-20 MG/5ML PO SUSP: 30.0000 mL | ORAL | Status: DC | PRN

## 2020-09-03 MED ORDER — METHOCARBAMOL 500 MG IVPB - SIMPLE MED
500.0000 mg | Freq: Four times a day (QID) | INTRAVENOUS | Status: DC | PRN
  Administered 2020-09-03: 500 mg via INTRAVENOUS
  Filled 2020-09-03: qty 500
  Filled 2020-09-03: qty 50

## 2020-09-03 SURGICAL SUPPLY — 38 items
BAG SPEC THK2 15X12 ZIP CLS (MISCELLANEOUS) ×1
BAG ZIPLOCK 12X15 (MISCELLANEOUS) ×2 IMPLANT
BIT DRILL 4.3MMS DISTAL GRDTED (BIT) IMPLANT
COVER SURGICAL LIGHT HANDLE (MISCELLANEOUS) ×2 IMPLANT
COVER WAND RF STERILE (DRAPES) IMPLANT
DRAPE STERI IOBAN 125X83 (DRAPES) ×2 IMPLANT
DRESSING MEPILEX FLEX 4X4 (GAUZE/BANDAGES/DRESSINGS) ×3 IMPLANT
DRILL 4.3MMS DISTAL GRADUATED (BIT) ×2
DRSG AQUACEL AG 3.5X4 (GAUZE/BANDAGES/DRESSINGS) ×2 IMPLANT
DRSG EMULSION OIL 3X3 NADH (GAUZE/BANDAGES/DRESSINGS) ×2 IMPLANT
DRSG MEPILEX BORDER 4X8 (GAUZE/BANDAGES/DRESSINGS) ×2 IMPLANT
DRSG MEPILEX FLEX 4X4 (GAUZE/BANDAGES/DRESSINGS) ×6
DURAPREP 26ML APPLICATOR (WOUND CARE) ×2 IMPLANT
ELECT REM PT RETURN 15FT ADLT (MISCELLANEOUS) ×2 IMPLANT
GLOVE SURG ENC MOIS LTX SZ8 (GLOVE) ×4 IMPLANT
GLOVE SURG LTX SZ7.5 (GLOVE) ×4 IMPLANT
GOWN STRL REUS W/ TWL XL LVL3 (GOWN DISPOSABLE) ×1 IMPLANT
GOWN STRL REUS W/TWL LRG LVL3 (GOWN DISPOSABLE) ×2 IMPLANT
GOWN STRL REUS W/TWL XL LVL3 (GOWN DISPOSABLE) ×2
GUIDEPIN 3.2X17.5 THRD DISP (PIN) ×2 IMPLANT
GUIDEWIRE BALL NOSE 100CM (WIRE) ×1 IMPLANT
HFN RH 130 DEG 9MM X 360MM (Nail) ×1 IMPLANT
KIT BASIN OR (CUSTOM PROCEDURE TRAY) ×2 IMPLANT
KIT TURNOVER KIT A (KITS) ×2 IMPLANT
MANIFOLD NEPTUNE II (INSTRUMENTS) ×2 IMPLANT
NS IRRIG 1000ML POUR BTL (IV SOLUTION) ×2 IMPLANT
PACK GENERAL/GYN (CUSTOM PROCEDURE TRAY) ×2 IMPLANT
PENCIL SMOKE EVACUATOR (MISCELLANEOUS) IMPLANT
PROTECTOR NERVE ULNAR (MISCELLANEOUS) ×2 IMPLANT
SCREW BONE CORTICAL 5.0X44 (Screw) ×1 IMPLANT
SCREW LAG HIP NAIL 10.5X95 (Screw) ×1 IMPLANT
STAPLER VISISTAT 35W (STAPLE) ×2 IMPLANT
SUT VIC AB 0 CT1 36 (SUTURE) ×2 IMPLANT
SUT VIC AB 1 CT1 27 (SUTURE) ×4
SUT VIC AB 1 CT1 27XBRD ANTBC (SUTURE) ×2 IMPLANT
TOWEL OR 17X26 10 PK STRL BLUE (TOWEL DISPOSABLE) ×2 IMPLANT
TOWEL OR NON WOVEN STRL DISP B (DISPOSABLE) ×2 IMPLANT
WATER STERILE IRR 1000ML POUR (IV SOLUTION) ×4 IMPLANT

## 2020-09-03 NOTE — Progress Notes (Signed)
PROGRESS NOTE   Alejandra Taylor  NKN:[id number]    DOB: June 27, 1960    DOA: 09/02/2020  PCP: Lorrene Reid, PA-C   I have briefly reviewed patients previous medical records in [facility].  Chief Complaint  Patient presents with  . Diarrhea  . Hip Pain    right    Brief Narrative:  60 year old married female, independent, medical history significant for but not limited to CKD 2, depression, anxiety, PTSD, type II DM, endometrial CA in 2011, gastritis, hyperlipidemia, hypertension, hypothyroidism, migraine, chronic back pain and colitis, presented with 4 days of diarrheal illness, a mechanical fall 4 days ago with associated right hip pain and difficulty ambulating.  Admitted for acute diarrhea, right hip fracture, AKI and hypokalemia.  Orthopedics plan surgical fixation pending improvement of her hypokalemia.   Assessment & Plan:  Principal Problem:   Intertrochanteric fracture of right femur (HCC) Active Problems:   Migraine with status migrainosus, not intractable   Hypertension associated with diabetes ([place])   PTSD (post-traumatic stress disorder)   Hypothyroidism   Hyperlipidemia associated with type 2 diabetes mellitus ([place])   Depression with anxiety   Acute renal failure superimposed on stage 2 chronic kidney disease (HCC)   Hyponatremia   Diabetes mellitus without complication (HCC)   Gastritis without bleeding   Diarrhea   Hypokalemia   Leukocytosis   Osteoporosis   Acute diarrhea  Suspect food poisoning versus acute GE.  CT abdomen without acute findings.  Seems to have resolved.  No BM since mid afternoon on 2/11 prior to coming to the hospital.  Continue to monitor.  Could consider discontinuing contact isolation if no further BM by this evening.  Intertrochanteric right hip fracture  Sustained s/p mechanical fall.  Orthopedics evaluation appreciated.  Discussed with Dr. Berenice Primas in person this morning.  Plans for surgical fixation this afternoon  pending improvement of hypokalemia to at least 3 or above as per his discussion with anesthesia.  Based on data available, patient at low to intermediate risk for perioperative CV events but once hypokalemia has improved, may proceed with indicated surgery with usual close perioperative monitoring.  Suspect some degree of osteoporosis given age, ongoing tobacco.  Check vitamin D levels.  Consider outpatient bone density scan.  Acute kidney injury complicating stage II CKD  Secondary to diarrhea and dehydration.  Baseline creatinine around 0.9.  Presented with creatinine of 1.37.  AKI resolved post hydration.  Follow BMP daily  Hypokalemia  Secondary to diarrheal illness.  Continue to replace aggressively and follow BMP.  Magnesium 1.9.  Leukocytosis  Suspect stress margination from acute diarrhea, fall and fracture.  No clinical concern for infectious etiology.  Resolved.  Anemia  Hemoglobin dropped from 14.6 on admission to 11.4.  Likely multifactorial related to fracture related bleeding and dilutional from IVF.  Follow CBC daily and transfuse if hemoglobin 7 g or less.  Dehydration with hyponatremia  Secondary to GI losses.  Continue IV fluids at least for today  Depression/anxiety/PTSD  Follows with outpatient psych/Dr. Juluis Pitch  Continue prior home meds  Type II DM with hyperlipidemia/hypoglycemia  Holding home Metformin.  Well-controlled on SSI alone.  Continue home statins and Zetia.  Monitor CBGs closely and treat per hypoglycemia protocol.  On D5 of IV fluids.  Essential hypertension:  Presented with hypotension with SBP in the 80s likely due to GI illness and meds.  Hypotension resolved after IV fluids.  Blood pressures controlled off of home meds (lisinopril and Lasix).  Continue to monitor.  Could consider as needed hydralazine and hold above meds for a couple days given recent AKI.  Hypothyroidism  Continue Synthroid and  outpatient follow-up.  Migraine  Continue Topamax.  Tobacco abuse  Cessation counseled.  Body mass index is 21.93 kg/m.    DVT prophylaxis: SCDs Start: 09/02/20 2022     Code Status: Full Code Family Communication: None at bedside Disposition:  Status is: Inpatient  Remains inpatient appropriate because:Inpatient level of care appropriate due to severity of illness   Dispo: The patient is from: Home              Anticipated d/c is to: TBD based on postop therapy evaluation.              Patient currently is not medically stable to d/c.   Difficult to place patient No        Consultants:   Orthopedics  Procedures:   None  Antimicrobials:    Anti-infectives (From admission, onward)   None        Subjective:  Patient seen early this morning.  No BM since approximately 2 PM on 3/11.  No nausea, vomiting or abdominal pain.  Ongoing right hip pain, worse with movement.  Reports nonactive lifestyle but no chest pain, dyspnea, dizziness or lightheadedness with or without exertion.  Denies history of heart disease.  Objective:   Vitals:   09/02/20 2206 09/03/20 0205 09/03/20 0626 09/03/20 0946  BP: (!) 165/79 126/60 130/60 130/83  Pulse: 87 70 66 73  Resp: 16 18 16 16   Temp: 98.2 F (36.8 C) 97.8 F (36.6 C) 98.4 F (36.9 C) 98.3 F (36.8 C)  TempSrc: Oral Oral Oral   SpO2: 100% 94% 100% 99%  Weight:      Height:        General exam: Middle-age female, moderately built and thinly nourished lying comfortably propped up in bed without distress.  Oral mucosa with borderline hydration. Respiratory system: Clear to auscultation. Respiratory effort normal. Cardiovascular system: S1 & S2 heard, RRR. No JVD, murmurs, rubs, gallops or clicks. No pedal edema.  Telemetry personally reviewed: Sinus rhythm.  Brief nonsustained SVT noted on 3/12 at 2:15 AM. Gastrointestinal system: Abdomen is nondistended, soft and nontender. No organomegaly or masses felt. Normal  bowel sounds heard. Central nervous system: Alert and oriented. No focal neurological deficits. Extremities: Symmetric 5 x 5 power except RLE with movements restricted secondary to pain, appears shortened and externally rotated. Skin: No rashes, lesions or ulcers Psychiatry: Judgement and insight appear normal. Mood & affect appropriate.     Data Reviewed:   I have personally reviewed following labs and imaging studies   CBC: Recent Labs  Lab 09/02/20 1545 09/03/20 0421  WBC 17.1* 9.9  NEUTROABS 14.8*  --   HGB 14.6 11.4*  HCT 39.9 32.4*  MCV 89.3 91.3  PLT 310 034    Basic Metabolic Panel: Recent Labs  Lab 09/02/20 1545 09/03/20 0421  NA 132* 132*  K 2.4* 2.3*  CL 84* 94*  CO2 22 26  GLUCOSE 162* 59*  BUN 21* 13  CREATININE 1.37* 0.91  CALCIUM 9.2 7.9*  MG 1.9  --     Liver Function Tests: No results for input(s): AST, ALT, ALKPHOS, BILITOT, PROT, ALBUMIN in the last 168 hours.  CBG: Recent Labs  Lab 09/03/20 0109 09/03/20 0713 09/03/20 0735  GLUCAP 94 82 98    Microbiology Studies:   Recent Results (from the past 240 hour(s))  SARS CORONAVIRUS 2 (TAT  6-24 HRS) Nasopharyngeal Nasopharyngeal Swab     Status: None   Collection Time: 09/02/20  4:08 PM   Specimen: Nasopharyngeal Swab  Result Value Ref Range Status   SARS Coronavirus 2 NEGATIVE NEGATIVE Final    Comment: (NOTE) SARS-CoV-2 target nucleic acids are NOT DETECTED.  The SARS-CoV-2 RNA is generally detectable in upper and lower respiratory specimens during the acute phase of infection. Negative results do not preclude SARS-CoV-2 infection, do not rule out co-infections with other pathogens, and should not be used as the sole basis for treatment or other patient management decisions. Negative results must be combined with clinical observations, patient history, and epidemiological information. The expected result is Negative.  Fact Sheet for  Patients: SugarRoll.be  Fact Sheet for Healthcare Providers: https://www.woods-mathews.com/  This test is not yet approved or cleared by the [place] FDA and  has been authorized for detection and/or diagnosis of SARS-CoV-2 by FDA under an Emergency Use Authorization (EUA). This EUA will remain  in effect (meaning this test can be used) for the duration of the COVID-19 declaration under Se ction 564(b)(1) of the Act, 21 U.S.C. section 360bbb-3(b)(1), unless the authorization is terminated or revoked sooner.  Performed at [facility], [place] [address], [place], [place] [address]      Radiology Studies:  CT Abdomen Pelvis W Contrast  Result Date: 09/02/2020 CLINICAL DATA:  Abdominal pain and diarrhea since Monday. Patient fell. EXAM: CT ABDOMEN AND PELVIS WITH CONTRAST TECHNIQUE: Multidetector CT imaging of the abdomen and pelvis was performed using the standard protocol following bolus administration of intravenous contrast. CONTRAST:  163mL OMNIPAQUE IOHEXOL 300 MG/ML  SOLN COMPARISON:  None. FINDINGS: Lower chest: The lung bases are clear of acute process. No pleural effusion or pulmonary lesions. Scattered age advanced coronary artery calcifications and aortic calcifications. The heart is normal in size. No pericardial effusion. The distal esophagus and aorta are unremarkable. Hepatobiliary: No hepatic lesions or intrahepatic biliary dilatation. The gallbladder is surgically absent. No common bile duct dilatation. Pancreas: Moderate pancreatic atrophy but no mass or inflammation. Spleen: Normal size.  No focal lesions. Adrenals/Urinary Tract: The adrenal glands and kidneys are unremarkable. No worrisome renal lesions or hydronephrosis. The bladder is unremarkable. Stomach/Bowel: The stomach, duodenum, small bowel and colon are unremarkable. No acute inflammatory changes, mass lesions or obstructive findings. The terminal ileum is normal. The  appendix is normal. Vascular/Lymphatic: Age advanced atherosclerotic calcifications involving the aorta and iliac arteries. No aneurysm or dissection. The major venous structures are patent. No mesenteric or retroperitoneal mass or adenopathy. Reproductive: Surgically absent. Other: No pelvic mass or adenopathy. No free pelvic fluid collections. No inguinal mass or adenopathy. No abdominal wall hernia or subcutaneous lesions. Musculoskeletal: Comminuted intertrochanteric fracture of the right hip. Surrounding hematoma noted. IMPRESSION: 1. No acute abdominal/pelvic findings, mass lesions or adenopathy. 2. Status post cholecystectomy. No biliary dilatation. 3. Intertrochanteric fracture of the right hip. 4. Age advanced atherosclerotic calcifications involving the aorta and iliac arteries. Aortic Atherosclerosis (ICD10-I70.0). Electronically Signed   By: Marijo Sanes M.D.   On: 09/02/2020 19:45   DG Chest Port 1 View  Result Date: 09/02/2020 CLINICAL DATA:  [place].  Right hip pain. EXAM: PORTABLE CHEST 1 VIEW COMPARISON:  05/25/2017 FINDINGS: The cardiac silhouette, mediastinal and hilar contours are within normal limits and stable. No acute pulmonary findings. No pulmonary lesions. The bony thorax is intact.  Remote healed rib fractures are noted. IMPRESSION: No acute cardiopulmonary findings. Electronically Signed   By: Marijo Sanes  M.D.   On: 09/02/2020 16:23   DG Hip Unilat With Pelvis 2-3 Views Right  Result Date: 09/02/2020 CLINICAL DATA:  [place] today.  Right hip pain. EXAM: DG HIP (WITH OR WITHOUT PELVIS) 2-3V RIGHT COMPARISON:  None. FINDINGS: There is a displaced and comminuted intertrochanteric fracture of the right hip. The left hip is intact.  No acute fracture. The bony pelvis is intact.  No pelvic fractures or bone lesions. IMPRESSION: Displaced and comminuted intertrochanteric fracture of the right hip. Electronically Signed   By: Marijo Sanes M.D.   On: 09/02/2020 16:22     Scheduled Meds:    . ezetimibe  10 mg Oral Daily  . insulin aspart  0-9 Units Subcutaneous TID WC  . levothyroxine  125 mcg Oral Q0600  . pantoprazole  40 mg Oral Daily  . simvastatin  40 mg Oral q1800  . topiramate  100 mg Oral QHS  . venlafaxine XR  300 mg Oral Q breakfast    Continuous Infusions:   . dextrose 5 % and 0.9% NaCl 100 mL/hr at 09/03/20 0646     LOS: 1 day     Vernell Leep, MD, [place], [facility]. [facility]    To contact the attending provider between 7A-7P or the covering provider during after hours 7P-7A, please log into the web site [email or web address] and access using universal [place] password for that web site. If you do not have the password, please call the hospital operator.  09/03/2020, 11:37 AM

## 2020-09-03 NOTE — Progress Notes (Signed)
Pt. Came back in the unit, status post surgery ORIF. Pt. Is alert and oriented, not on any distress.  surgical dressing dry and intact.Pain level 8/10, IV PRN Dilaudid given. Post op vital sign taken and recorded. Needs attended to.

## 2020-09-03 NOTE — Consult Note (Signed)
Reason for Consult: Intertrochanteric hip fracture right side Referring Physician: Hospitalist  Alejandra Taylor is an 60 y.o. female.  HPI: Hospitalist  Past Medical History:  Diagnosis Date  . Chronic back pain   . Colitis   . Diabetes mellitus   . 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 History of degenerative disc disease   . Hypercholesterolemia   . Hypertension   . Hyperthyroidism   . Migraines   . Post traumatic stress disorder   . Spinal stenosis     Past Surgical History:  Procedure Laterality Date  . ABDOMINAL HYSTERECTOMY    . CHOLECYSTECTOMY    . GANGLION CYST EXCISION     L wrist cyst removal with tendon rupture and repair  . ROBOTIC ASSISTED LAP VAGINAL HYSTERECTOMY  08/19/2009   BSO for endo ca  . TONSILLECTOMY      Family History  Problem Relation Age of Onset  . Hypertension Father   . Diabetes Father   . Heart attack Father   . Pancreatic cancer Father   . Colon cancer Maternal Aunt   . Ovarian cancer Maternal Aunt   . Ovarian cancer Paternal Grandmother   . Stroke Paternal Grandmother   . Diabetes Paternal Grandmother   . Ovarian cancer Cousin   . Breast cancer Cousin     Social History:  reports that she has been smoking cigarettes. She has a 15.00 pack-year smoking history. She has never used smokeless tobacco. She reports current drug use. Drug: Marijuana. She reports that she does not drink alcohol.  Allergies:  Allergies  Allergen Reactions  . Other Hives    neoprene  . Penicillins Rash    Medications: I have reviewed the patient's current medications.  Results for orders placed or performed during the hospital encounter of 09/02/20 (from the past 48 hour(s))  Type and screen [facility]     Status: None   Collection Time: 09/02/20  2:31 PM  Result Value Ref Range   ABO/RH(D) AB NEG    Antibody Screen NEG    Sample Expiration      09/05/2020,2359 Performed at [facility], [place] [address], [place], [place] [address]   Basic metabolic panel     Status: Abnormal   Collection Time: 09/02/20  3:45 PM  Result Value Ref Range   Sodium 132 (L) 135 - 145 mmol/L   Potassium 2.4 (LL) 3.5 - 5.1 mmol/L    Comment: CRITICAL RESULT CALLED TO, READ BACK BY AND VERIFIED WITH: S.CLAPP AT 1702 ON 03.11.22 BY N.THOMPSON    Chloride 84 (L) 98 - 111 mmol/L   CO2 22 22 - 32 mmol/L   Glucose, Bld 162 (H) 70 - 99 mg/dL    Comment: Glucose reference range applies only to samples taken after fasting for at least 8 hours.   BUN 21 (H) 6 - 20 mg/dL   Creatinine, Ser 1.37 (H) 0.44 - 1.00 mg/dL   Calcium 9.2 8.9 - 10.3 mg/dL   GFR, Estimated 44 (L) >60 mL/min    Comment: (NOTE) Calculated using the CKD-EPI Creatinine Equation (2021)    Anion gap 26 (H) 5 - 15    Comment: REPEATED TO VERIFY Performed at [facility], [place] [address] [place], [place] [address]   CBC WITH DIFFERENTIAL     Status: Abnormal   Collection Time: 09/02/20  3:45 PM  Result Value Ref Range   WBC 17.1 (H) 4.0 - 10.5 K/uL   RBC 4.47 3.87 -  5.11 MIL/uL   Hemoglobin 14.6 12.0 - 15.0 g/dL   HCT 39.9 36.0 - 46.0 %   MCV 89.3 80.0 - 100.0 fL   MCH 32.7 26.0 - 34.0 pg   MCHC 36.6 (H) 30.0 - 36.0 g/dL   RDW 12.1 11.5 - 15.5 %   Platelets 310 150 - 400 K/uL   nRBC 0.0 0.0 - 0.2 %   Neutrophils Relative % 86 %   Neutro Abs 14.8 (H) 1.7 - 7.7 K/uL   Lymphocytes Relative 7 %   Lymphs Abs 1.1 0.7 - 4.0 K/uL   Monocytes Relative 6 %   Monocytes Absolute 1.1 (H) 0.1 - 1.0 K/uL   Eosinophils Relative 0 %   Eosinophils Absolute 0.0 0.0 - 0.5 K/uL   Basophils Relative 0 %   Basophils Absolute 0.1 0.0 - 0.1 K/uL   Immature Granulocytes 1 %   Abs Immature Granulocytes 0.08 (H) 0.00 - 0.07 K/uL    Comment: Performed at [facility], [place] [address], [place], [place] [address]  Protime-INR     Status: None   Collection Time: 09/02/20  3:45 PM  Result Value Ref Range   Prothrombin Time 13.8  11.4 - 15.2 seconds   INR 1.1 0.8 - 1.2    Comment: (NOTE) INR goal varies based on device and disease states. Performed at [facility], [place] [address], [place], [place] [address]   Magnesium     Status: None   Collection Time: 09/02/20  3:45 PM  Result Value Ref Range   Magnesium 1.9 1.7 - 2.4 mg/dL    Comment: Performed at [facility], [place] [address] [place], [place] [address]  SARS CORONAVIRUS 2 (TAT 6-24 HRS) Nasopharyngeal Nasopharyngeal Swab     Status: None   Collection Time: 09/02/20  4:08 PM   Specimen: Nasopharyngeal Swab  Result Value Ref Range   SARS Coronavirus 2 NEGATIVE NEGATIVE    Comment: (NOTE) SARS-CoV-2 target nucleic acids are NOT DETECTED.  The SARS-CoV-2 RNA is generally detectable in upper and lower respiratory specimens during the acute phase of infection. Negative results do not preclude SARS-CoV-2 infection, do not rule out co-infections with other pathogens, and should not be used as the sole basis for treatment or other patient management decisions. Negative results must be combined with clinical observations, patient history, and epidemiological information. The expected result is Negative.  Fact Sheet for Patients: SugarRoll.be  Fact Sheet for Healthcare Providers: https://www.woods-mathews.com/  This test is not yet approved or cleared by the [place] FDA and  has been authorized for detection and/or diagnosis of SARS-CoV-2 by FDA under an Emergency Use Authorization (EUA). This EUA will remain  in effect (meaning this test can be used) for the duration of the COVID-19 declaration under Se ction 564(b)(1) of the Act, 21 U.S.C. section 360bbb-3(b)(1), unless the authorization is terminated or revoked sooner.  Performed at [facility], [place] [address] [place], [place] [address]   ABO/Rh     Status: None   Collection Time: 09/02/20  5:15 PM  Result  Value Ref Range   ABO/RH(D)      AB NEG Performed at [place] [address], [place], [place] [address]   Urinalysis, Routine w reflex microscopic Urine, Catheterized     Status: Abnormal   Collection Time: 09/02/20  8:34 PM  Result Value Ref Range   Color, Urine YELLOW YELLOW   APPearance CLEAR CLEAR   Specific Gravity,  Urine 1.024 1.005 - 1.030   pH 5.0 5.0 - 8.0   Glucose, UA NEGATIVE NEGATIVE mg/dL   Hgb urine dipstick SMALL (A) NEGATIVE   Bilirubin Urine NEGATIVE NEGATIVE   Ketones, ur 80 (A) NEGATIVE mg/dL   Protein, ur NEGATIVE NEGATIVE mg/dL   Nitrite POSITIVE (A) NEGATIVE   Leukocytes,Ua SMALL (A) NEGATIVE   RBC / HPF 0-5 0 - 5 RBC/hpf   WBC, UA 6-10 0 - 5 WBC/hpf   Bacteria, UA RARE (A) NONE SEEN   Squamous Epithelial / LPF 0-5 0 - 5    Comment: Performed at [facility], [place] [address], [place], [place] [address]  Glucose, capillary     Status: None   Collection Time: 09/02/20 10:24 PM  Result Value Ref Range   Glucose-Capillary 74 70 - 99 mg/dL    Comment: Glucose reference range applies only to samples taken after fasting for at least 8 hours.  Glucose, capillary     Status: Abnormal   Collection Time: 09/03/20 12:26 AM  Result Value Ref Range   Glucose-Capillary 63 (L) 70 - 99 mg/dL    Comment: Glucose reference range applies only to samples taken after fasting for at least 8 hours.  Glucose, capillary     Status: Abnormal   Collection Time: 09/03/20 12:28 AM  Result Value Ref Range   Glucose-Capillary 65 (L) 70 - 99 mg/dL    Comment: Glucose reference range applies only to samples taken after fasting for at least 8 hours.  Glucose, capillary     Status: None   Collection Time: 09/03/20  1:09 AM  Result Value Ref Range   Glucose-Capillary 94 70 - 99 mg/dL    Comment: Glucose reference range applies only to samples taken after fasting for at least 8 hours.  Basic metabolic panel     Status: Abnormal   Collection  Time: 09/03/20  4:21 AM  Result Value Ref Range   Sodium 132 (L) 135 - 145 mmol/L   Potassium 2.3 (LL) 3.5 - 5.1 mmol/L    Comment: CRITICAL RESULT CALLED TO, READ BACK BY AND VERIFIED WITH: JOSE CORALDE RN  09/03/20 @0536  BY P.HENDERSON    Chloride 94 (L) 98 - 111 mmol/L   CO2 26 22 - 32 mmol/L   Glucose, Bld 59 (L) 70 - 99 mg/dL    Comment: Glucose reference range applies only to samples taken after fasting for at least 8 hours.   BUN 13 6 - 20 mg/dL   Creatinine, Ser 0.91 0.44 - 1.00 mg/dL   Calcium 7.9 (L) 8.9 - 10.3 mg/dL   GFR, Estimated >60 >60 mL/min    Comment: (NOTE) Calculated using the CKD-EPI Creatinine Equation (2021)    Anion gap 12 5 - 15    Comment: Performed at [facility], [place] [address] [place], [place] [address]  CBC     Status: Abnormal   Collection Time: 09/03/20  4:21 AM  Result Value Ref Range   WBC 9.9 4.0 - 10.5 K/uL   RBC 3.55 (L) 3.87 - 5.11 MIL/uL   Hemoglobin 11.4 (L) 12.0 - 15.0 g/dL   HCT 32.4 (L) 36.0 - 46.0 %   MCV 91.3 80.0 - 100.0 fL   MCH 32.1 26.0 - 34.0 pg   MCHC 35.2 30.0 - 36.0 g/dL   RDW 12.2 11.5 - 15.5 %   Platelets 259 150 - 400 K/uL   nRBC 0.0 0.0 - 0.2 %    Comment: Performed  at [facility], [place] [address] [place], [place] [address]  Glucose, capillary     Status: None   Collection Time: 09/03/20  7:13 AM  Result Value Ref Range   Glucose-Capillary 82 70 - 99 mg/dL    Comment: Glucose reference range applies only to samples taken after fasting for at least 8 hours.  Glucose, capillary     Status: None   Collection Time: 09/03/20  7:35 AM  Result Value Ref Range   Glucose-Capillary 98 70 - 99 mg/dL    Comment: Glucose reference range applies only to samples taken after fasting for at least 8 hours.    CT Abdomen Pelvis W Contrast  Result Date: 09/02/2020 CLINICAL DATA:  Abdominal pain and diarrhea since Monday. Patient fell. EXAM: CT ABDOMEN AND PELVIS WITH CONTRAST TECHNIQUE:  Multidetector CT imaging of the abdomen and pelvis was performed using the standard protocol following bolus administration of intravenous contrast. CONTRAST:  166mL OMNIPAQUE IOHEXOL 300 MG/ML  SOLN COMPARISON:  None. FINDINGS: Lower chest: The lung bases are clear of acute process. No pleural effusion or pulmonary lesions. Scattered age advanced coronary artery calcifications and aortic calcifications. The heart is normal in size. No pericardial effusion. The distal esophagus and aorta are unremarkable. Hepatobiliary: No hepatic lesions or intrahepatic biliary dilatation. The gallbladder is surgically absent. No common bile duct dilatation. Pancreas: Moderate pancreatic atrophy but no mass or inflammation. Spleen: Normal size.  No focal lesions. Adrenals/Urinary Tract: The adrenal glands and kidneys are unremarkable. No worrisome renal lesions or hydronephrosis. The bladder is unremarkable. Stomach/Bowel: The stomach, duodenum, small bowel and colon are unremarkable. No acute inflammatory changes, mass lesions or obstructive findings. The terminal ileum is normal. The appendix is normal. Vascular/Lymphatic: Age advanced atherosclerotic calcifications involving the aorta and iliac arteries. No aneurysm or dissection. The major venous structures are patent. No mesenteric or retroperitoneal mass or adenopathy. Reproductive: Surgically absent. Other: No pelvic mass or adenopathy. No free pelvic fluid collections. No inguinal mass or adenopathy. No abdominal wall hernia or subcutaneous lesions. Musculoskeletal: Comminuted intertrochanteric fracture of the right hip. Surrounding hematoma noted. IMPRESSION: 1. No acute abdominal/pelvic findings, mass lesions or adenopathy. 2. Status post cholecystectomy. No biliary dilatation. 3. Intertrochanteric fracture of the right hip. 4. Age advanced atherosclerotic calcifications involving the aorta and iliac arteries. Aortic Atherosclerosis (ICD10-I70.0). Electronically Signed    By: Marijo Sanes M.D.   On: 09/02/2020 19:45   DG Chest Port 1 View  Result Date: 09/02/2020 CLINICAL DATA:  [place].  Right hip pain. EXAM: PORTABLE CHEST 1 VIEW COMPARISON:  05/25/2017 FINDINGS: The cardiac silhouette, mediastinal and hilar contours are within normal limits and stable. No acute pulmonary findings. No pulmonary lesions. The bony thorax is intact.  Remote healed rib fractures are noted. IMPRESSION: No acute cardiopulmonary findings. Electronically Signed   By: Marijo Sanes M.D.   On: 09/02/2020 16:23   DG Hip Unilat With Pelvis 2-3 Views Right  Result Date: 09/02/2020 CLINICAL DATA:  [place] today.  Right hip pain. EXAM: DG HIP (WITH OR WITHOUT PELVIS) 2-3V RIGHT COMPARISON:  None. FINDINGS: There is a displaced and comminuted intertrochanteric fracture of the right hip. The left hip is intact.  No acute fracture. The bony pelvis is intact.  No pelvic fractures or bone lesions. IMPRESSION: Displaced and comminuted intertrochanteric fracture of the right hip. Electronically Signed   By: Marijo Sanes M.D.   On: 09/02/2020 16:22    ROS  ROS: I have reviewed the patient's review of  systems thoroughly and there are no positive responses as relates to the HPI. Blood pressure 130/60, pulse 66, temperature 98.4 F (36.9 C), temperature source Oral, resp. rate 16, height 5\' 7"  (1.702 m), weight 63.5 kg, SpO2 100 %. Physical Exam Well-developed well-nourished patient in no acute distress. Alert and oriented x3 HEENT:within normal limits Cardiac: Regular rate and rhythm Pulmonary: Lungs clear to auscultation Abdomen: Soft and nontender.  Normal active bowel sounds   Alta Corning 09/03/2020, 8:47 AM      Musculoskeletal: (Right hip: Externally rotated and shortened.) Assessment/Plan: 60 year old female with intertrochanteric hip fracture right side with significant medical issues the most pressing of which currently is low potassium.  She had begun potassium repletion through the  night.  I spoke with the hospitalist this morning and they believe they can get her potassium up to an acceptable level which they would like to be 3.  There may be an ability to put her to sleep with slightly below 3 but that will be evaluated based on the circumstances and repletion.  I had a long talk with the patient about her situation and treatment options.  She is well aware of the risks associated with surgery including bleeding, infection, need for further surgery and failure of surgery to correct the problem.  She is aware of the slight risk of death and and around the time of surgery.  In light of these risks and understanding the nature of her problem she does wish to proceed with surgical intervention.  We will plan on doing this today once the potassium has been repleted.  We will keep her n.p.o. currently.

## 2020-09-03 NOTE — Brief Op Note (Signed)
09/03/2020  3:18 PM  PATIENT:  Alejandra Taylor  60 y.o. female  PRE-OPERATIVE DIAGNOSIS:  right intertrochanteric fracture  POST-OPERATIVE DIAGNOSIS:  right intertrochanteric fracture  PROCEDURE:  Procedure(s): INTRAMEDULLARY (IM) NAIL FEMORAL (Right)  SURGEON:  Surgeon(s) and Role:    Dorna Leitz, MD - Primary  PHYSICIAN ASSISTANT:   ASSISTANTS: mike craig   ANESTHESIA:   general  EBL:  175 mL   BLOOD ADMINISTERED:none  DRAINS: none   LOCAL MEDICATIONS USED:  MARCAINE     SPECIMEN:  No Specimen  DISPOSITION OF SPECIMEN:  N/A  COUNTS:  YES  TOURNIQUET:  * No tourniquets in log *  DICTATION: .Other Dictation: Dictation Number [id number]  PLAN OF CARE: Admit to inpatient   PATIENT DISPOSITION:  PACU - hemodynamically stable.   Delay start of Pharmacological VTE agent (>24hrs) due to surgical blood loss or risk of bleeding: yes

## 2020-09-03 NOTE — Anesthesia Preprocedure Evaluation (Signed)
Anesthesia Evaluation  Patient identified by MRN, date of birth, ID band Patient awake    Reviewed: Allergy & Precautions, NPO status , Patient's Chart, lab work & pertinent test results  Airway Mallampati: I  TM Distance: >3 FB Neck ROM: Full    Dental no notable dental hx. (+) Dental Advisory Given   Pulmonary Current Smoker,    Pulmonary exam normal        Cardiovascular hypertension, negative cardio ROS Normal cardiovascular exam     Neuro/Psych PSYCHIATRIC DISORDERS Anxiety Depression negative neurological ROS     GI/Hepatic negative GI ROS, Neg liver ROS,   Endo/Other  diabetesHypothyroidism   Renal/GU negative Renal ROS  negative genitourinary   Musculoskeletal negative musculoskeletal ROS (+)   Abdominal   Peds negative pediatric ROS (+)  Hematology negative hematology ROS (+)   Anesthesia Other Findings   Reproductive/Obstetrics negative OB ROS                             Anesthesia Physical Anesthesia Plan  ASA: III  Anesthesia Plan: General   Post-op Pain Management:    Induction: Intravenous  PONV Risk Score and Plan: 3 and Ondansetron and Midazolam  Airway Management Planned: LMA  Additional Equipment:   Intra-op Plan:   Post-operative Plan: Extubation in OR  Informed Consent: I have reviewed the patients History and Physical, chart, labs and discussed the procedure including the risks, benefits and alternatives for the proposed anesthesia with the patient or authorized representative who has indicated his/her understanding and acceptance.     Dental advisory given  Plan Discussed with: CRNA and Anesthesiologist  Anesthesia Plan Comments:         Anesthesia Quick Evaluation

## 2020-09-03 NOTE — Anesthesia Procedure Notes (Signed)
Date/Time: 09/03/2020 3:14 PM Performed by: Cynda Familia, CRNA Oxygen Delivery Method: Simple face mask Placement Confirmation: positive ETCO2 and breath sounds checked- equal and bilateral Dental Injury: Teeth and Oropharynx as per pre-operative assessment

## 2020-09-03 NOTE — Anesthesia Procedure Notes (Signed)
Procedure Name: LMA Insertion Date/Time: 09/03/2020 1:27 PM Performed by: Cynda Familia, CRNA Pre-anesthesia Checklist: Patient identified, Emergency Drugs available, Suction available and Patient being monitored Patient Re-evaluated:Patient Re-evaluated prior to induction Oxygen Delivery Method: Circle System Utilized Preoxygenation: Pre-oxygenation with 100% oxygen Induction Type: IV induction Ventilation: Mask ventilation without difficulty LMA: LMA inserted LMA Size: 4.0 Number of attempts: 1 Placement Confirmation: positive ETCO2 Tube secured with: Tape Dental Injury: Teeth and Oropharynx as per pre-operative assessment  Comments: Smooth IV induction Alejandra Taylor-- LMA insertion AM CRNA atraumatic-- no teeth as preop -- bilat BS Alejandra Taylor

## 2020-09-03 NOTE — Op Note (Signed)
NAMEJAMESINA, GAUGH MEDICAL RECORD NO: [id number] ACCOUNT NO: [id number] DATE OF BIRTH: Dec 01, 1960 FACILITY: [facility]: [facility] PHYSICIAN: Alta Corning, MD  Operative Report   DATE OF PROCEDURE: 09/03/2020  PREOPERATIVE DIAGNOSIS:  Four-part intertrochanteric hip fracture, right.  POSTOPERATIVE DIAGNOSIS:  Four-part intertrochanteric hip fracture, right.  PRINCIPAL PROCEDURE: 1.  Open reduction and internal fixation of right 4-part intertrochanteric hip fracture with an intramedullary rod, 36 cm x 9 mm with a 95 mm compression screw and a distal interlock. 2.  Interpretation of multiple intraoperative fluoroscopic images.  SURGEON:  Alta Corning, MD  ASSISTANT:  Harmon Dun, PA-C, who was present for the entire case and assisted by retraction of tissues, manipulation of the leg, and closing to minimize OR time.  BRIEF HISTORY:  The patient is a 60 year old female with a long history of falling and suffering an intertrochanteric hip fracture.  We were consulted for management.  She had a low potassium, which had to be repleted and as soon as this was done, she  was taken to the operating room for fixation.  DESCRIPTION OF PROCEDURE:  The patient was taken to the operating room.  After adequate anesthesia was obtained with a general anesthetic, the patient was placed supine on the operating table, was then moved on to the [place] bed.  All bony prominences were  well tractioned and then traction was applied to the fractured leg.  The leg was then prepped and draped in the usual sterile fashion after fluoroscopy confirmed adequate reduction.  Once this was undertaken, the incision was made just proximal to the  trochanter.  Subcutaneous tissue was done to the level of the trochanter and the guidewire was advanced into the central portion of the canal through the tip of the trochanter.  Fluoroscopic imaging was used to make sure this was adequate on both AP and  lateral.  An  introductory hole was made followed by a guidewire, followed by reaming.  We started getting chatter at 10, so we knew we needed to get to 11 for a 9 mm rod, so we went there and got a 9 x 36 rod and then put that into place.  Under  fluoroscopic guidance, we put a compression hip screw center-to-center and then put that under compression technique, which got real almost near anatomic reduction.  Once that was done, we removed that jig, went down distally and did a freehand interlock  to control rotation and length, got an excellent bite there and once that was done, the wounds were all irrigated and suctioned dry, closed in layers.  Sterile compressive dressing applied and the patient was taken to recovery and was noted to be in  satisfactory condition.  Estimated blood loss for the procedure was 175 mL.   PUS/SUN D: 09/03/2020 3:22:22 pm T: 09/03/2020 4:54:00 pm  JOB: [id number]/ [id number]

## 2020-09-03 NOTE — Plan of Care (Signed)
  Problem: Education: Goal: Knowledge of General Education information will improve Description Including pain rating scale, medication(s)/side effects and non-pharmacologic comfort measures Outcome: Progressing   

## 2020-09-03 NOTE — Progress Notes (Deleted)
Orthopedic Tech Progress Note Patient Details:  Alejandra Taylor Nov 10, 1960 [id number] Patient stated that she has cam walker boot at home. Patient ID: Alejandra Taylor, female   DOB: Aug 20, 1960, 60 y.o.   MRN: [id number]   Alejandra Taylor 09/03/2020, 10:44 AM

## 2020-09-03 NOTE — Transfer of Care (Signed)
Immediate Anesthesia Transfer of Care Note  Patient: Alejandra Taylor  Procedure(s) Performed: INTRAMEDULLARY (IM) NAIL FEMORAL (Right Hip)  Patient Location: PACU  Anesthesia Type:General  Level of Consciousness: awake and alert   Airway & Oxygen Therapy: Patient Spontanous Breathing and Patient connected to face mask oxygen  Post-op Assessment: Report given to RN and Post -op Vital signs reviewed and stable  Post vital signs: Reviewed and stable  Last Vitals:  Vitals Value Taken Time  BP 172/91 09/03/20 1520  Temp    Pulse 91 09/03/20 1523  Resp 10 09/03/20 1523  SpO2 100 % 09/03/20 1523  Vitals shown include unvalidated device data.  Last Pain:  Vitals:   09/03/20 0754  TempSrc:   PainSc: 3       Patients Stated Pain Goal: 2 (31/28/11 8867)  Complications: No complications documented.

## 2020-09-03 NOTE — Progress Notes (Signed)
Pt. Off the unit for scheduled hip surgery.

## 2020-09-04 DIAGNOSIS — D62 Acute posthemorrhagic anemia: Secondary | ICD-10-CM

## 2020-09-04 LAB — BASIC METABOLIC PANEL
Anion gap: 7 (ref 5–15)
BUN: 6 mg/dL (ref 6–20)
CO2: 28 mmol/L (ref 22–32)
Calcium: 8 mg/dL — ABNORMAL LOW (ref 8.9–10.3)
Chloride: 96 mmol/L — ABNORMAL LOW (ref 98–111)
Creatinine, Ser: 0.69 mg/dL (ref 0.44–1.00)
GFR, Estimated: >60 mL/min (ref >60)
Glucose, Bld: 195 mg/dL — ABNORMAL HIGH (ref 70–99)
Potassium: 3.5 mmol/L (ref 3.5–5.1)
Sodium: 131 mmol/L — ABNORMAL LOW (ref 135–145)

## 2020-09-04 LAB — CBC
HCT: 27.5 % — ABNORMAL LOW (ref 36.0–46.0)
Hemoglobin: 9.8 g/dL — ABNORMAL LOW (ref 12.0–15.0)
MCH: 32.3 pg (ref 26.0–34.0)
MCHC: 35.6 g/dL (ref 30.0–36.0)
MCV: 90.8 fL (ref 80.0–100.0)
Platelets: 219 10*3/uL (ref 150–400)
RBC: 3.03 MIL/uL — ABNORMAL LOW (ref 3.87–5.11)
RDW: 12.2 % (ref 11.5–15.5)
WBC: 9.1 10*3/uL (ref 4.0–10.5)
nRBC: 0 % (ref 0.0–0.2)

## 2020-09-04 LAB — GLUCOSE, CAPILLARY
Glucose-Capillary: 159 mg/dL — ABNORMAL HIGH (ref 70–99)
Glucose-Capillary: 169 mg/dL — ABNORMAL HIGH (ref 70–99)
Glucose-Capillary: 98 mg/dL (ref 70–99)
Glucose-Capillary: 145 mg/dL — ABNORMAL HIGH (ref 70–99)

## 2020-09-04 MED ORDER — POTASSIUM CHLORIDE 10 MEQ/100ML IV SOLN
INTRAVENOUS | Status: AC
  Administered 2020-09-04: 10 meq via INTRAVENOUS
  Filled 2020-09-04: qty 100

## 2020-09-04 MED ORDER — HYDROCODONE-ACETAMINOPHEN 5-325 MG PO TABS
1.0000 | ORAL_TABLET | ORAL | Status: DC | PRN
  Administered 2020-09-04 – 2020-09-06 (×10): 1 via ORAL
  Filled 2020-09-04 (×10): qty 1

## 2020-09-04 MED ORDER — POTASSIUM CHLORIDE CRYS ER 20 MEQ PO TBCR
40.0000 meq | EXTENDED_RELEASE_TABLET | Freq: Once | ORAL | Status: AC
  Administered 2020-09-04: 40 meq via ORAL
  Filled 2020-09-04: qty 2

## 2020-09-04 NOTE — Progress Notes (Signed)
PROGRESS NOTE   Alejandra Taylor  DUK:[id number]    DOB: 09/16/1960    DOA: 09/02/2020  PCP: Lorrene Reid, PA-C   I have briefly reviewed patients previous medical records in [facility].  Chief Complaint  Patient presents with  . Diarrhea  . Hip Pain    right    Brief Narrative:  60 year old married female, independent, medical history significant for but not limited to CKD 2, depression, anxiety, PTSD, type II DM, endometrial CA in 2011, gastritis, hyperlipidemia, hypertension, hypothyroidism, migraine, chronic back pain and colitis, presented with 4 days of diarrheal illness, a mechanical fall 4 days ago with associated right hip pain and difficulty ambulating.  Admitted for acute diarrhea, right hip fracture, AKI and hypokalemia.  Orthopedics consulted, s/p ORIF with IM rod 3/12.   Assessment & Plan:  Principal Problem:   Intertrochanteric fracture of right femur (HCC) Active Problems:   Migraine with status migrainosus, not intractable   Hypertension associated with diabetes ([place])   PTSD (post-traumatic stress disorder)   Hypothyroidism   Hyperlipidemia associated with type 2 diabetes mellitus ([place])   Depression with anxiety   Acute renal failure superimposed on stage 2 chronic kidney disease (HCC)   Hyponatremia   Diabetes mellitus without complication (HCC)   Gastritis without bleeding   Diarrhea   Hypokalemia   Leukocytosis   Osteoporosis   Intertrochanteric fracture of right hip (HCC)   Acute diarrhea  Suspect food poisoning versus acute GE.  CT abdomen without acute findings.  Resolved.  Discontinued contact isolation.  Intertrochanteric right hip fracture, s/p ORIF with IM nail 3/12.  Sustained s/p mechanical fall.  Orthopedics consulted and patient is s/p ORIF with IM nail on 3/12.  Postop progressing well.  PT recommends home health PT, possible DC home 3/14.  Suspect some degree of osteoporosis given age, ongoing tobacco.  Check vitamin D  levels.  Consider outpatient bone density scan.  Acute kidney injury complicating stage II CKD  Secondary to diarrhea and dehydration.  Baseline creatinine around 0.9.  Presented with creatinine of 1.37.  AKI resolved post hydration.  Hypokalemia  Secondary to diarrheal illness.  Replaced aggressively.  Potassium 3.5.  Replaced to aim for close to 4.  Follow BMP in a.m.  Magnesium 1.9.  Leukocytosis  Suspect stress margination from acute diarrhea, fall and fracture.  No clinical concern for infectious etiology.  Resolved.  Postop acute blood loss anemia  Hemoglobin dropped from 14.6 on admission to 11.4.  Likely multifactorial related to fracture related bleeding and dilutional from IVF.  Hemoglobin dropped from 11.4-9.8.  EBL Intra-Op 175 mL.  Follow CBC daily and transfuse if hemoglobin 7 g or less.  Dehydration with hyponatremia  Secondary to GI losses.  Resolved.  Discontinued IV fluids.  Oral intake ad lib.  Depression/anxiety/PTSD  Follows with outpatient psych/Dr. Juluis Pitch  Continue prior home meds  Type II DM with hyperlipidemia/hypoglycemia  Holding home Metformin.  Well-controlled on SSI alone.  Continue home statins and Zetia.  Hypoglycemia resolved.  CBGs reasonably controlled.  Essential hypertension:  Presented with hypotension with SBP in the 80s likely due to GI illness and meds.  Hypotension resolved after IV fluids.  Blood pressures controlled off of home meds (lisinopril and Lasix).  Continue to monitor.  Could consider as needed hydralazine and hold above meds for a couple days given recent AKI.  Hypothyroidism  Continue Synthroid and outpatient follow-up.  Migraine  Continue Topamax.  Tobacco abuse  Cessation counseled.  Asymptomatic bacteriuria  No antibiotics at this time.  Body mass index is 21.93 kg/m.    DVT prophylaxis: SCDs Start: 09/03/20 1622 SCDs Start: 09/02/20 2022     Code Status: Full  Code Family Communication: None at bedside.  Patient declined MDs offer to update family.  She stated that she would do it herself. Disposition:  Status is: Inpatient  Remains inpatient appropriate because:Inpatient level of care appropriate due to severity of illness   Dispo: The patient is from: Home              Anticipated d/c is to: Home with home health possibly 3/14.              Patient currently is not medically stable to d/c.   Difficult to place patient No        Consultants:   Orthopedics  Procedures:   ORIF with IM nail of intertrochanteric right hip fracture on 3/12 by Dr. Berenice Primas.  Antimicrobials:    Anti-infectives (From admission, onward)   Start     Dose/Rate Route Frequency Ordered Stop   09/03/20 2000  vancomycin (VANCOCIN) IVPB 1000 mg/200 mL premix        1,000 mg 200 mL/hr over 60 Minutes Intravenous Every 12 hours 09/03/20 1621 09/04/20 0044   09/03/20 1630  ceFAZolin (ANCEF) IVPB 2g/100 mL premix  Status:  Discontinued        2 g 200 mL/hr over 30 Minutes Intravenous On call to O.R. 09/03/20 1621 09/03/20 1628   09/03/20 1249  ceFAZolin (ANCEF) 2-4 GM/100ML-% IVPB       Note to Pharmacy: Octavio Graves   : cabinet override      09/03/20 1249 09/04/20 0059        Subjective:  Doing well.  No BM since hospital admission.  Tolerating diet without nausea or vomiting.  Mild pain in operated right hip.  Denies any other complaints.  Objective:   Vitals:   09/03/20 2002 09/03/20 2110 09/04/20 0101 09/04/20 0505  BP: (!) 151/82 (!) 154/92 132/81 119/71  Pulse: 80 93 93 96  Resp: 19 20 19 19   Temp: 98.4 F (36.9 C) 98.1 F (36.7 C) 99.8 F (37.7 C)   TempSrc: Oral Oral Oral   SpO2: 100% 96% 99% 98%  Weight:      Height:        General exam: Middle-age female, moderately built and thinly nourished lying comfortably propped up in bed without distress.  Oral mucosa moist. Respiratory system: Clear to auscultation.  No increased work of  breathing. Cardiovascular system: S1 and S2 heard, RRR.  No JVD, murmurs or pedal edema.  Telemetry personally reviewed: Sinus rhythm.  Discontinue telemetry Gastrointestinal system: Abdomen is nondistended, soft and nontender. No organomegaly or masses felt. Normal bowel sounds heard. Central nervous system: Alert and oriented. No focal neurological deficits. Extremities: Symmetric 5 x 5 power.  Right hip surgical site dressing clean and dry without acute findings. Skin: No rashes, lesions or ulcers Psychiatry: Judgement and insight appear normal. Mood & affect appropriate.     Data Reviewed:   I have personally reviewed following labs and imaging studies   CBC: Recent Labs  Lab 09/02/20 1545 09/03/20 0421 09/04/20 0415  WBC 17.1* 9.9 9.1  NEUTROABS 14.8*  --   --   HGB 14.6 11.4* 9.8*  HCT 39.9 32.4* 27.5*  MCV 89.3 91.3 90.8  PLT 310 259 962    Basic Metabolic Panel: Recent Labs  Lab 09/02/20 1545 09/03/20 0421 09/03/20  1141 09/04/20 0415  NA 132* 132* 135 131*  K 2.4* 2.3* 3.4* 3.5  CL 84* 94* 99 96*  CO2 22 26 26 28   GLUCOSE 162* 59* 123* 195*  BUN 21* 13 10 6   CREATININE 1.37* 0.91 0.82 0.69  CALCIUM 9.2 7.9* 8.2* 8.0*  MG 1.9  --   --   --     Liver Function Tests: No results for input(s): AST, ALT, ALKPHOS, BILITOT, PROT, ALBUMIN in the last 168 hours.  CBG: Recent Labs  Lab 09/03/20 2005 09/04/20 0741 09/04/20 1119  GLUCAP 221* 159* 169*    Microbiology Studies:   Recent Results (from the past 240 hour(s))  SARS CORONAVIRUS 2 (TAT 6-24 HRS) Nasopharyngeal Nasopharyngeal Swab     Status: None   Collection Time: 09/02/20  4:08 PM   Specimen: Nasopharyngeal Swab  Result Value Ref Range Status   SARS Coronavirus 2 NEGATIVE NEGATIVE Final    Comment: (NOTE) SARS-CoV-2 target nucleic acids are NOT DETECTED.  The SARS-CoV-2 RNA is generally detectable in upper and lower respiratory specimens during the acute phase of infection. Negative results  do not preclude SARS-CoV-2 infection, do not rule out co-infections with other pathogens, and should not be used as the sole basis for treatment or other patient management decisions. Negative results must be combined with clinical observations, patient history, and epidemiological information. The expected result is Negative.  Fact Sheet for Patients: SugarRoll.be  Fact Sheet for Healthcare Providers: https://www.woods-mathews.com/  This test is not yet approved or cleared by the [place] FDA and  has been authorized for detection and/or diagnosis of SARS-CoV-2 by FDA under an Emergency Use Authorization (EUA). This EUA will remain  in effect (meaning this test can be used) for the duration of the COVID-19 declaration under Se ction 564(b)(1) of the Act, 21 U.S.C. section 360bbb-3(b)(1), unless the authorization is terminated or revoked sooner.  Performed at [facility], [place] [address] [place], [place] [address]   Culture, Urine     Status: Abnormal (Preliminary result)   Collection Time: 09/02/20  8:34 PM   Specimen: Urine, Random  Result Value Ref Range Status   Specimen Description   Final    URINE, RANDOM Performed at [facility] [address], [place], [place] [address]    Special Requests   Final    NONE Performed at [facility], [place] [address] [place], [place] [address]    Culture (A)  Final    >=100,000 COLONIES/mL GRAM NEGATIVE RODS SUSCEPTIBILITIES TO FOLLOW Performed at [facility], [place] [address], [place], [place] [address]    Report Status PENDING  Incomplete     Radiology Studies:  CT Abdomen Pelvis W Contrast  Result Date: 09/02/2020 CLINICAL DATA:  Abdominal pain and diarrhea since Monday. Patient fell. EXAM: CT ABDOMEN AND PELVIS WITH CONTRAST TECHNIQUE: Multidetector CT imaging of the abdomen and pelvis was performed using the standard protocol  following bolus administration of intravenous contrast. CONTRAST:  154mL OMNIPAQUE IOHEXOL 300 MG/ML  SOLN COMPARISON:  None. FINDINGS: Lower chest: The lung bases are clear of acute process. No pleural effusion or pulmonary lesions. Scattered age advanced coronary artery calcifications and aortic calcifications. The heart is normal in size. No pericardial effusion. The distal esophagus and aorta are unremarkable. Hepatobiliary: No hepatic lesions or intrahepatic biliary dilatation. The gallbladder is surgically absent. No common bile duct dilatation. Pancreas: Moderate pancreatic atrophy but no mass or inflammation. Spleen: Normal size.  No  focal lesions. Adrenals/Urinary Tract: The adrenal glands and kidneys are unremarkable. No worrisome renal lesions or hydronephrosis. The bladder is unremarkable. Stomach/Bowel: The stomach, duodenum, small bowel and colon are unremarkable. No acute inflammatory changes, mass lesions or obstructive findings. The terminal ileum is normal. The appendix is normal. Vascular/Lymphatic: Age advanced atherosclerotic calcifications involving the aorta and iliac arteries. No aneurysm or dissection. The major venous structures are patent. No mesenteric or retroperitoneal mass or adenopathy. Reproductive: Surgically absent. Other: No pelvic mass or adenopathy. No free pelvic fluid collections. No inguinal mass or adenopathy. No abdominal wall hernia or subcutaneous lesions. Musculoskeletal: Comminuted intertrochanteric fracture of the right hip. Surrounding hematoma noted. IMPRESSION: 1. No acute abdominal/pelvic findings, mass lesions or adenopathy. 2. Status post cholecystectomy. No biliary dilatation. 3. Intertrochanteric fracture of the right hip. 4. Age advanced atherosclerotic calcifications involving the aorta and iliac arteries. Aortic Atherosclerosis (ICD10-I70.0). Electronically Signed   By: Marijo Sanes M.D.   On: 09/02/2020 19:45   Pelvis Portable  Result Date:  09/03/2020 CLINICAL DATA:  Perioperative EXAM: PORTABLE PELVIS 1-2 VIEWS COMPARISON:  None. FINDINGS: Intramedullary nail fixation of displaced intertrochanteric fractures of the right femur. No evidence of perihardware fracture or component malposition. Expected overlying postoperative changes. IMPRESSION: Intramedullary nail fixation of displaced intertrochanteric fractures of the right femur. Electronically Signed   By: Eddie Candle M.D.   On: 09/03/2020 17:45   DG Chest Port 1 View  Result Date: 09/02/2020 CLINICAL DATA:  [place].  Right hip pain. EXAM: PORTABLE CHEST 1 VIEW COMPARISON:  05/25/2017 FINDINGS: The cardiac silhouette, mediastinal and hilar contours are within normal limits and stable. No acute pulmonary findings. No pulmonary lesions. The bony thorax is intact.  Remote healed rib fractures are noted. IMPRESSION: No acute cardiopulmonary findings. Electronically Signed   By: Marijo Sanes M.D.   On: 09/02/2020 16:23   DG C-Arm 1-60 Min-No Report  Result Date: 09/03/2020 Fluoroscopy was utilized by the requesting physician.  No radiographic interpretation.   DG HIP OPERATIVE UNILAT W OR W/O PELVIS RIGHT  Result Date: 09/03/2020 CLINICAL DATA:  Right hip fracture repair EXAM: OPERATIVE RIGHT HIP (WITH PELVIS IF PERFORMED) 4 VIEWS TECHNIQUE: Fluoroscopic spot image(s) were submitted for interpretation post-operatively. COMPARISON:  None. FINDINGS: Four images were obtained during the right hip fracture repair. A gamma nail crosses the fracture extending into the femoral neck. An intramedullary femoral rod is also identified with the distal interosseous screw. Hardware is in good position. IMPRESSION: Right hip fracture repair as above. Electronically Signed   By: Dorise Bullion III M.D   On: 09/03/2020 15:34   DG Hip Unilat With Pelvis 2-3 Views Right  Result Date: 09/02/2020 CLINICAL DATA:  [place] today.  Right hip pain. EXAM: DG HIP (WITH OR WITHOUT PELVIS) 2-3V RIGHT COMPARISON:  None.  FINDINGS: There is a displaced and comminuted intertrochanteric fracture of the right hip. The left hip is intact.  No acute fracture. The bony pelvis is intact.  No pelvic fractures or bone lesions. IMPRESSION: Displaced and comminuted intertrochanteric fracture of the right hip. Electronically Signed   By: Marijo Sanes M.D.   On: 09/02/2020 16:22     Scheduled Meds:   . aspirin EC  325 mg Oral Q breakfast  . Chlorhexidine Gluconate Cloth  6 each Topical Daily  . docusate sodium  100 mg Oral BID  . ezetimibe  10 mg Oral Daily  . ferrous sulfate  325 mg Oral BID WC  . insulin aspart  0-9 Units Subcutaneous TID  WC  . levothyroxine  125 mcg Oral Q0600  . pantoprazole  40 mg Oral Daily  . QUEtiapine  300 mg Oral QHS  . simvastatin  40 mg Oral q1800  . topiramate  100 mg Oral QHS  . venlafaxine XR  300 mg Oral Q breakfast    Continuous Infusions:   . methocarbamol (ROBAXIN) IV Stopped (09/03/20 1651)     LOS: 2 days     Vernell Leep, MD, [employer], [facility]. [facility]    To contact the attending provider between 7A-7P or the covering provider during after hours 7P-7A, please log into the web site [email or web address] and access using universal [place] password for that web site. If you do not have the password, please call the hospital operator.  09/04/2020, 12:27 PM

## 2020-09-04 NOTE — Progress Notes (Signed)
Subjective: 1 Day Post-Op Procedure(s) (LRB): INTRAMEDULLARY (IM) NAIL FEMORAL (Right) Patient reports pain as moderate.    Objective: Vital signs in last 24 hours: Temp:  [97.5 F (36.4 C)-99.8 F (37.7 C)] 99.8 F (37.7 C) (03/13 0101) Pulse Rate:  [73-96] 96 (03/13 0505) Resp:  [11-21] 19 (03/13 0505) BP: (119-196)/(63-147) 119/71 (03/13 0505) SpO2:  [95 %-100 %] 98 % (03/13 0505)  Intake/Output from previous day: 03/12 0701 - 03/13 0700 In: 4569.3 [P.O.:100; I.V.:3021.8; IV Piggyback:1447.5] Out: 1725 [Urine:1550; Blood:175] Intake/Output this shift: No intake/output data recorded.  Recent Labs    09/02/20 1545 09/03/20 0421 09/04/20 0415  HGB 14.6 11.4* 9.8*   Recent Labs    09/03/20 0421 09/04/20 0415  WBC 9.9 9.1  RBC 3.55* 3.03*  HCT 32.4* 27.5*  PLT 259 219   Recent Labs    09/03/20 1141 09/04/20 0415  NA 135 131*  K 3.4* 3.5  CL 99 96*  CO2 26 28  BUN 10 6  CREATININE 0.82 0.69  GLUCOSE 123* 195*  CALCIUM 8.2* 8.0*   Recent Labs    09/02/20 1545  INR 1.1    Neurologically intact ABD soft Neurovascular intact Sensation intact distally Intact pulses distally Dorsiflexion/Plantar flexion intact No cellulitis present Compartment soft   Assessment/Plan: 1 Day Post-Op Procedure(s) (LRB): INTRAMEDULLARY (IM) NAIL FEMORAL (Right) Advance diet Up with therapy will decide with input of PT on home vs SNF tomorrow.      Alta Corning 09/04/2020, 9:31 AM

## 2020-09-04 NOTE — Progress Notes (Signed)
Foley d/c not urinating yet. New external cath in place until physical therapist work with patient. diet advance to heart healthy/carb modified. Right hip incision clean dry and intact and cms intact. Will cont. To monitor pt

## 2020-09-04 NOTE — Anesthesia Postprocedure Evaluation (Signed)
Anesthesia Post Note  Patient: Alejandra Taylor  Procedure(s) Performed: INTRAMEDULLARY (IM) NAIL FEMORAL (Right Hip)     Patient location during evaluation: PACU Anesthesia Type: General Level of consciousness: sedated Pain management: pain level controlled Vital Signs Assessment: post-procedure vital signs reviewed and stable Respiratory status: spontaneous breathing and respiratory function stable Cardiovascular status: stable Postop Assessment: no apparent nausea or vomiting Anesthetic complications: no   No complications documented.                 Tiffnay Bossi DANIEL

## 2020-09-04 NOTE — Evaluation (Signed)
Physical Therapy Evaluation Patient Details Name: Alejandra Taylor MRN: [id number] DOB: [birth year] Today's Date: 09/04/2020   History of Present Illness  60 yo female admitted with IT fx of R femur-s/p ORIF 09/03/20. Hx of CKD, OA, chronic pain, spinal stenosis, falls, PTSD, DM, endometrial ca.  Clinical Impression  On eval, pt required Mod assist for mobility. She took a few steps in the room with use of a RW. Mobility is limited by pain. Pt presents with general weakness, decreased activity tolerance, and impaired gait and balance. Discussed d/c plan-pt plans to d/c to her mother's home (mother is in her 42s). Will plan to follow and progress activity as tolerated. Encouraged pt to sit up in recliner as tolerated on today.     Follow Up Recommendations Home health PT;Supervision/Assistance - 24 hour    Equipment Recommendations  Rolling walker with 5" wheels    Recommendations for Other Services       Precautions / Restrictions Precautions Precautions: Fall Restrictions Weight Bearing Restrictions: No Other Position/Activity Restrictions: WBAT      Mobility  Bed Mobility Overal bed mobility: Needs Assistance Bed Mobility: Supine to Sit     Supine to sit: Min assist;HOB elevated     General bed mobility comments: Assist for R LE and to scoot to EOB. Pt relied moderately on bedrail. Increased time. Cues for safety, technique.    Transfers Overall transfer level: Needs assistance Equipment used: Rolling walker (2 wheeled) Transfers: Sit to/from Stand Sit to Stand: Mod assist         General transfer comment: Assist to rise, steady, control descent. Cues for safety, technique, hand placement. Stand pivot to recliner using RW. Increased time.  Ambulation/Gait Ambulation/Gait assistance: Min assist Gait Distance (Feet): 2 Feet Assistive device: Rolling walker (2 wheeled) Gait Pattern/deviations: Step-to pattern;Antalgic;Decreased stance time - right     General  Gait Details: Assist to stabilize. Cues for safety, technique, sequence. Distance limited by pain.  Stairs            Wheelchair Mobility    Modified Rankin (Stroke Patients Only)       Balance Overall balance assessment: Needs assistance;History of Falls         Standing balance support: Bilateral upper extremity supported Standing balance-Leahy Scale: Poor                               Pertinent Vitals/Pain Pain Assessment: 0-10 Pain Score: 7  Pain Location: R LE Pain Descriptors / Indicators: Sore;Discomfort;Grimacing Pain Intervention(s): Limited activity within patient's tolerance;Monitored during session;Repositioned    Home Living Family/patient expects to be discharged to:: Private residence Living Arrangements: Spouse/significant other;Children (adult son who works) Available Help at Discharge: Family (planning to d/c to Mom's house) Type of Home: House Home Access: Stairs to enter Entrance Stairs-Rails: None Entrance Stairs-Number of Steps: 2 Home Layout: One level Home Equipment: None      Prior Function Level of Independence: Independent               Hand Dominance        Extremity/Trunk Assessment   Upper Extremity Assessment Upper Extremity Assessment: Overall WFL for tasks assessed    Lower Extremity Assessment Lower Extremity Assessment: Generalized weakness    Cervical / Trunk Assessment Cervical / Trunk Assessment: Normal  Communication   Communication: No difficulties  Cognition Arousal/Alertness: Awake/alert Behavior During Therapy: WFL for tasks assessed/performed Overall Cognitive Status: Within Functional Limits for  tasks assessed                                        General Comments      Exercises     Assessment/Plan    PT Assessment Patient needs continued PT services  PT Problem List Decreased strength;Decreased mobility;Decreased range of motion;Decreased activity  tolerance;Decreased balance;Pain;Decreased knowledge of use of DME       PT Treatment Interventions DME instruction;Gait training;Therapeutic activities;Therapeutic exercise;Patient/family education;Stair training;Functional mobility training;Balance training    PT Goals (Current goals can be found in the Care Plan section)  Acute Rehab PT Goals Patient Stated Goal: home PT Goal Formulation: With patient Time For Goal Achievement: 09/18/20 Potential to Achieve Goals: Good    Frequency Min 5X/week   Barriers to discharge Decreased caregiver support mom will be caregiver-she is in her 28s    Co-evaluation               AM-PAC PT "6 Clicks" Mobility  Outcome Measure Help needed turning from your back to your side while in a flat bed without using bedrails?: A Lot Help needed moving from lying on your back to sitting on the side of a flat bed without using bedrails?: A Lot Help needed moving to and from a bed to a chair (including a wheelchair)?: A Lot Help needed standing up from a chair using your arms (e.g., wheelchair or bedside chair)?: A Lot Help needed to walk in hospital room?: A Lot Help needed climbing 3-5 steps with a railing? : A Lot 6 Click Score: 12    End of Session Equipment Utilized During Treatment: Gait belt Activity Tolerance: Patient limited by pain Patient left: in chair;with call bell/phone within reach        Time: 1043-1102 PT Time Calculation (min) (ACUTE ONLY): 19 min   Charges:   PT Evaluation $PT Eval Moderate Complexity: 1 Mod             [name], PT Acute Rehabilitation  Office: [phone] Pager: [phone]

## 2020-09-05 ENCOUNTER — Encounter (HOSPITAL_COMMUNITY): Payer: Self-pay | Admitting: Orthopedic Surgery

## 2020-09-05 ENCOUNTER — Other Ambulatory Visit: Payer: Self-pay | Admitting: Physician Assistant

## 2020-09-05 DIAGNOSIS — K2970 Gastritis, unspecified, without bleeding: Secondary | ICD-10-CM

## 2020-09-05 LAB — URINE CULTURE: Culture: >=100000 — AB

## 2020-09-05 LAB — CBC
HCT: 27.1 % — ABNORMAL LOW (ref 36.0–46.0)
Hemoglobin: 9.5 g/dL — ABNORMAL LOW (ref 12.0–15.0)
MCH: 32.2 pg (ref 26.0–34.0)
MCHC: 35.1 g/dL (ref 30.0–36.0)
MCV: 91.9 fL (ref 80.0–100.0)
Platelets: 209 10*3/uL (ref 150–400)
RBC: 2.95 MIL/uL — ABNORMAL LOW (ref 3.87–5.11)
RDW: 12.1 % (ref 11.5–15.5)
WBC: 8.3 10*3/uL (ref 4.0–10.5)
nRBC: 0 % (ref 0.0–0.2)

## 2020-09-05 LAB — GLUCOSE, CAPILLARY
Glucose-Capillary: 100 mg/dL — ABNORMAL HIGH (ref 70–99)
Glucose-Capillary: 173 mg/dL — ABNORMAL HIGH (ref 70–99)
Glucose-Capillary: 147 mg/dL — ABNORMAL HIGH (ref 70–99)
Glucose-Capillary: 139 mg/dL — ABNORMAL HIGH (ref 70–99)

## 2020-09-05 LAB — BASIC METABOLIC PANEL
Anion gap: 10 (ref 5–15)
Anion gap: 7 (ref 5–15)
BUN: 9 mg/dL (ref 6–20)
BUN: 11 mg/dL (ref 6–20)
CO2: 25 mmol/L (ref 22–32)
CO2: 25 mmol/L (ref 22–32)
Calcium: 8.2 mg/dL — ABNORMAL LOW (ref 8.9–10.3)
Calcium: 8.1 mg/dL — ABNORMAL LOW (ref 8.9–10.3)
Chloride: 100 mmol/L (ref 98–111)
Chloride: 100 mmol/L (ref 98–111)
Creatinine, Ser: 0.72 mg/dL (ref 0.44–1.00)
Creatinine, Ser: 0.6 mg/dL (ref 0.44–1.00)
GFR, Estimated: >60 mL/min (ref >60)
GFR, Estimated: >60 mL/min (ref >60)
Glucose, Bld: 116 mg/dL — ABNORMAL HIGH (ref 70–99)
Glucose, Bld: 167 mg/dL — ABNORMAL HIGH (ref 70–99)
Potassium: 2.9 mmol/L — ABNORMAL LOW (ref 3.5–5.1)
Potassium: 3.8 mmol/L (ref 3.5–5.1)
Sodium: 135 mmol/L (ref 135–145)
Sodium: 132 mmol/L — ABNORMAL LOW (ref 135–145)

## 2020-09-05 LAB — MAGNESIUM
Magnesium: 1.5 mg/dL — ABNORMAL LOW (ref 1.7–2.4)
Magnesium: 3.2 mg/dL — ABNORMAL HIGH (ref 1.7–2.4)

## 2020-09-05 MED ORDER — POTASSIUM CHLORIDE CRYS ER 20 MEQ PO TBCR
40.0000 meq | EXTENDED_RELEASE_TABLET | ORAL | Status: AC
  Administered 2020-09-05 (×2): 40 meq via ORAL
  Filled 2020-09-05 (×2): qty 2

## 2020-09-05 MED ORDER — MAGNESIUM SULFATE 4 GM/100ML IV SOLN
4.0000 g | Freq: Once | INTRAVENOUS | Status: AC
  Administered 2020-09-05: 4 g via INTRAVENOUS
  Filled 2020-09-05: qty 100

## 2020-09-05 MED ORDER — FERROUS SULFATE 325 (65 FE) MG PO TABS: 325.0000 mg | ORAL_TABLET | Freq: Two times a day (BID) | ORAL | 0 refills | Status: DC

## 2020-09-05 MED ORDER — VENLAFAXINE HCL ER 150 MG PO CP24: 300.0000 mg | ORAL_CAPSULE | Freq: Every day | ORAL | Status: AC

## 2020-09-05 MED ORDER — CLONAZEPAM 1 MG PO TABS
1.0000 mg | ORAL_TABLET | Freq: Three times a day (TID) | ORAL | Status: DC | PRN
Start: 2020-09-05 — End: 2020-09-06
  Administered 2020-09-05: 1 mg via ORAL
  Filled 2020-09-05: qty 1

## 2020-09-05 MED ORDER — DOCUSATE SODIUM 100 MG PO CAPS: 100.0000 mg | ORAL_CAPSULE | Freq: Two times a day (BID) | ORAL | 0 refills | Status: DC

## 2020-09-05 MED ORDER — HYDROMORPHONE HCL 1 MG/ML IJ SOLN
0.5000 mg | Freq: Four times a day (QID) | INTRAMUSCULAR | Status: DC | PRN
  Administered 2020-09-05: 0.5 mg via INTRAVENOUS
  Filled 2020-09-05: qty 0.5

## 2020-09-05 NOTE — Discharge Instructions (Addendum)

## 2020-09-05 NOTE — Evaluation (Signed)
Occupational Therapy Evaluation Patient Details Name: Nilsa Macht MRN: [id number] DOB: February 04, 1961 Today's Date: 09/05/2020    History of Present Illness 60 yo female admitted with IT fx of R femur-s/p ORIF 09/03/20. Hx of CKD, OA, chronic pain, spinal stenosis, falls, PTSD, DM, endometrial ca.   Clinical Impression   Patient is currently requiring assistance with ADLs including minmal assist with toileting, LE dressing (after adaptive equipment training), minimal assist with bathing, and setup with UE dressing and grooming,which is below patient's typical baseline of being Independent.  During this evaluation, patient was limited by pain to head (migraine() and to RT hip, stiffness to RLE with knee pain and possible IT band tightness-pt shown how to  Self massage and demonstrated back independently), and decreased activity tolerance, all of which has the potential to impact patient's safety and independence during functional mobility, as well as performance for ADLs. [place] "6-clicks" Daily Activity Inpatient Short Form score of 19/24 indicates 42.80% ADL impairment this session. Patient lives with her spouse who has ESRD on HD and son who works, so plans to d/c to her mother's home for 24/7 care. Patient demonstrates good rehab potential, and should benefit from continued skilled occupational therapy services while in acute care to maximize safety, independence and quality of life at home.  Continued occupational therapy services in the home is recommended.  ?    Follow Up Recommendations  Home health OT    Equipment Recommendations  3 in 1 bedside commode;Tub/shower seat (RW with 5" wheels.  "Hip Kit")    Recommendations for Other Services       Precautions / Restrictions Precautions Precautions: Fall Restrictions Weight Bearing Restrictions: No Other Position/Activity Restrictions: WBAT      Mobility Bed Mobility Overal bed mobility: Needs Assistance Bed  Mobility: Supine to Sit;Sit to Supine     Supine to sit: Min guard Sit to supine: Min assist   General bed mobility comments: Assist for R LE. Increased time.    Transfers Overall transfer level: Needs assistance Equipment used: Rolling walker (2 wheeled) Transfers: Sit to/from [facility] Sit to Stand: Min guard Stand pivot transfers: Min guard       General transfer comment: Pt stood from EOB to RW with cues for hand placement and Min guard to power up. Min guard for steadying with pivot to/from [facility] with cues for sequencing and safety.    Balance Overall balance assessment: History of Falls   Sitting balance-Leahy Scale: Good     Standing balance support: Bilateral upper extremity supported Standing balance-Leahy Scale: Poor                             ADL either performed or assessed with clinical judgement   ADL Overall ADL's : Needs assistance/impaired Eating/Feeding: Independent   Grooming: Set up;Sitting   Upper Body Bathing: Set up;Sitting   Lower Body Bathing: Minimal assistance;Sitting/lateral leans;Sit to/from stand   Upper Body Dressing : Set up;Sitting   Lower Body Dressing: Moderate assistance;Sit to/from stand Lower Body Dressing Details (indicate cue type and reason): Without adaptive equipment.  Pt was trained in use of reacher, sock aid, and long shoe horn. Pt demonstrated doffing RT sock with increased time/effort and setup/supervision and cues for technique. Pt used sock aid and donned LT and RT socks with supervision/cues. Pt shown how to use reacher to don/doff underwear/pants and cautioned about sitting whenever completing LE (or UE) dressing  to reduce  risk of falls Toilet Transfer: BSC;RW;Min guard;Stand-pivot;Cueing for sequencing Toilet Transfer Details (indicate cue type and reason): Cues for hand placement when sitting and rising from [facility] to RW.  Pt trained in setting up [place] at home and it's 3 potential uses.  Trained to stand for posterior hygiene as needed due to bars of BSC. Toileting- Clothing Manipulation and Hygiene: Minimal assistance Toileting - Clothing Manipulation Details (indicate cue type and reason): Based on general assessment as pt not due to void. Pt encouraed to stay off pure wick from now on and use BSC with nursing assist in order to increase getting up and moving, and pt agreeable to this. Tub/ Shower Transfer: Minimal Scientist, research (physical sciences) Details (indicate cue type and reason): Per general assessment. Functional mobility during ADLs: Min guard;Minimal assistance;Rolling walker       Vision Baseline Vision/History: Wears glasses Wears Glasses: At all times Patient Visual Report: No change from baseline       Perception     Praxis      Pertinent Vitals/Pain Pain Assessment: 0-10 Pain Score: 4  Pain Location: 4/10 RT hip pain. 6/10 migraine pain. Pain Intervention(s): Limited activity within patient's tolerance;Monitored during session (Pt reports that Nurse is aware.)     Hand Dominance Right   Extremity/Trunk Assessment Upper Extremity Assessment Upper Extremity Assessment: Overall WFL for tasks assessed   Lower Extremity Assessment Lower Extremity Assessment: Generalized weakness   Cervical / Trunk Assessment Cervical / Trunk Assessment: Normal   Communication Communication Communication: No difficulties   Cognition Arousal/Alertness: Awake/alert Behavior During Therapy: WFL for tasks assessed/performed Overall Cognitive Status: Within Functional Limits for tasks assessed                                     General Comments       Exercises     Shoulder Instructions      Home Living Family/patient expects to be discharged to:: Private residence Living Arrangements: Other relatives;Parent (Pt to discharge to Mother's home.  Home information below pertains to pt's mother's home. Mother can provide light  assistance and 24/7 supervision.) Available Help at Discharge: Available 24 hours/day Type of Home: House   Entrance Stairs-Number of Steps: 2 Entrance Stairs-Rails: None Home Layout: One level     Bathroom Shower/Tub: Occupational psychologist: Standard (can push from countertop)     Home Equipment: None          Prior Functioning/Environment Level of Independence: Independent                 OT Problem List: Pain;Decreased activity tolerance;Decreased safety awareness;Impaired balance (sitting and/or standing);Decreased knowledge of use of DME or AE      OT Treatment/Interventions: Self-care/ADL training;Therapeutic activities;DME and/or AE instruction;Patient/family education;Balance training    OT Goals(Current goals can be found in the care plan section) Acute Rehab OT Goals Patient Stated Goal: To stay in hospital "a few more days" before going home.  To "get mobility back in my hip." OT Goal Formulation: With patient Time For Goal Achievement: 09/19/20 Potential to Achieve Goals: Good ADL Goals Pt Will Perform Grooming: standing;with modified independence Pt Will Perform Lower Body Bathing: with supervision;with adaptive equipment;sitting/lateral leans Pt Will Perform Lower Body Dressing: with adaptive equipment;with set-up;sitting/lateral leans;sit to/from stand Pt Will Transfer to Toilet: with modified independence;bedside commode;ambulating Pt Will Perform Toileting - Clothing Manipulation and hygiene: with modified independence;sitting/lateral leans;sit  to/from stand Pt Will Perform Tub/Shower Transfer: with supervision;rolling walker;ambulating Additional ADL Goal #1: Pt will identify at least 3 fall prevention strategies to practice at home.  OT Frequency: Min 2X/week   Barriers to D/C:    Mother can only provide light assistance, but 24 hour supervision. 2 steps to mother's home with no rail.       Co-evaluation              AM-PAC OT  "6 Clicks" Daily Activity     Outcome Measure Help from another person eating meals?: None Help from another person taking care of personal grooming?: A Little Help from another person toileting, which includes using toliet, bedpan, or urinal?: A Little Help from another person bathing (including washing, rinsing, drying)?: A Little Help from another person to put on and taking off regular upper body clothing?: A Little Help from another person to put on and taking off regular lower body clothing?: A Little 6 Click Score: 19   End of Session Equipment Utilized During Treatment: Gait belt;Rolling walker Nurse Communication: Patient requests pain meds (No hospital phone in room.)  Activity Tolerance: Patient tolerated treatment well Patient left: in bed;with call bell/phone within reach;with bed alarm set;with nursing/sitter in room  OT Visit Diagnosis: Unsteadiness on feet (R26.81);History of falling (Z91.81);Pain Pain - Right/Left: Right Pain - part of body: Hip;Knee                Time: 5945-8592 OT Time Calculation (min): 44 min Charges:  OT General Charges $OT Visit: 1 Visit OT Evaluation $OT Eval Low Complexity: 1 Low OT Treatments $Self Care/Home Management : 8-22 mins $Therapeutic Activity: 8-22 mins  Anderson [place], [place] Office: [phone] 09/05/2020  Julien Girt 09/05/2020, 2:00 PM

## 2020-09-05 NOTE — Discharge Summary (Addendum)
Physician Discharge Summary  Alejandra Taylor MHD:[id number] DOB: December 08, 1960  PCP: Lorrene Reid, PA-C  Admitted from: Home Discharged to: Home  Admit date: 09/02/2020 Discharge date: 09/06/2020  Recommendations for Outpatient Follow-up:    Follow-up Information    Lorrene Reid, PA-C. Schedule an appointment as soon as possible for a visit in 1 week(s).   Specialty: Physician Assistant Why: To be seen with repeat labs (CBC, magnesium & BMP). Contact information: [place] [address] [phone]        Noemi Chapel, NP. Schedule an appointment as soon as possible for a visit.   Contact information: [address] [address] [phone]        Dorna Leitz, MD. Schedule an appointment as soon as possible for a visit in 10 day(s).   Specialty: Orthopedic Surgery Contact information: [place] [address] [phone]                Home Health:  Home Health Orders (From admission, onward)    Start     Ordered   09/05/20 [place]  At discharge       Question:  To provide the following care/treatments  Answer:  PT   09/05/20 1047           Equipment/Devices:     Durable Medical Equipment  (From admission, onward)         Start     Ordered   09/06/20 1059  DME 3-in-1  Once        09/06/20 1100   09/06/20 1058  DME Walker  Once       Question Answer Comment  Walker: With 5 Inch Wheels   Patient needs a walker to treat with the following condition Hip fracture ([place])      09/06/20 1100   09/05/20 1048  For home use only DME Walker rolling  Once       Comments: R Hip Fracture, s/p ORIF.  Question Answer Comment  Walker: With 5 Inch Wheels   Patient needs a walker to treat with the following condition Hip fracture ([place])      09/05/20 1047           Discharge Condition: Improved and stable.   Code Status: Full Code Diet recommendation:  Discharge Diet Orders (From  admission, onward)    Start     Ordered   09/05/20 0000  Diet - low sodium heart healthy        09/05/20 1045   09/05/20 0000  Diet Carb Modified        09/05/20 1045           Discharge Diagnoses:  Principal Problem:   Intertrochanteric fracture of right femur ([place]) Active Problems:   Migraine with status migrainosus, not intractable   Hypertension associated with diabetes ([place])   PTSD (post-traumatic stress disorder)   Hypothyroidism   Hyperlipidemia associated with type 2 diabetes mellitus ([place])   Depression with anxiety   Acute renal failure superimposed on stage 2 chronic kidney disease (HCC)   Hyponatremia   Diabetes mellitus without complication (HCC)   Gastritis without bleeding   Diarrhea   Hypokalemia   Leukocytosis   Osteoporosis   Intertrochanteric fracture of right hip [facility])   Brief Summary: 60 year old married female, independent, medical history significant for but not limited to CKD 2, depression, anxiety, PTSD, type II DM, endometrial CA in 2011, gastritis, hyperlipidemia, hypertension, hypothyroidism, migraine,  chronic back pain and colitis, presented with 4 days of diarrheal illness, a mechanical fall 4 days PTA with associated right hip pain and difficulty ambulating.  Admitted for acute diarrhea, right hip fracture, AKI and hypokalemia.  Orthopedics consulted, s/p ORIF with IM rod 3/12.   Improved.   Assessment & Plan:   Acute diarrhea  Suspect food poisoning versus acute GE.  CT abdomen without acute findings.  Resolved without recurrence.  Discontinued contact isolation.  Intertrochanteric right hip fracture, s/p ORIF with IM nail 3/12.  Sustained s/p mechanical fall.  Orthopedics consulted and patient is s/p ORIF with IM nail on 3/12.  Suspect some degree of osteoporosis given age, ongoing tobacco.  Check vitamin D levels.  Consider outpatient bone density scan.  Continues to progress postoperatively.   Orthopedics follow-up  appreciated.  Communicated with Dr. Berenice Primas who recommends aspirin 325 mg twice daily x30 days for postop DVT prophylaxis, opioid pain meds i.e. Percocet but since patient's pain is reasonably controlled on Vicodin here, discharging her on short supply of same.  Reviewed [place] PDMP and patient has not been prescribed opioids since July 2021 and indicates that she does not have any opioid pain meds at home.  I reviewed in detail with her regarding benefits and risks of opioids, extreme precautions, avoid driving while taking these medications, avoid alcohol, avoid signing legal paperwork while taking these, also advised her that she is on other medications i.e. clonazepam etc. for her psychiatric illness and taking along with them could cause increased drowsiness and she should then avoid the opioid pain medications and follow-up with her PCP or orthopedics.  She verbalized understanding.  Continue PPI while she is on aspirin for DVT prophylaxis  Acute kidney injury complicating stage II CKD  Secondary to diarrhea and dehydration.  Baseline creatinine around 0.9.  Presented with creatinine of 1.37.  AKI resolved post hydration.  Hypokalemia/hypomagnesemia  Secondary to diarrheal illness.  Replaced.  Outpatient follow-up.  Leukocytosis  Suspect stress margination from acute diarrhea, fall and fracture.  No clinical concern for infectious etiology.  Resolved.  Postop acute blood loss anemia  Baseline hemoglobin probably in the 14 g range.  This is gradually dropped to 9 g range and stable.  EBL Intra-Op 175 mL.  Hemoglobin 8.9 on day of discharge, relatively stable.  Outpatient follow-up with repeat CBCs.  Dehydration with hyponatremia  Secondary to GI losses.  Resolved.  Discontinued IV fluids.  Oral intake ad lib.  Depression/anxiety/PTSD  Follows with outpatient psych/Dr. Noemi Chapel  Continue prior home meds.  Patient states that she is taking Klonopin 3 times daily as  needed and insisted on adjusting to same.  Type II DM with hyperlipidemia/hypoglycemia  Resume home meds at DC.  Well-controlled on SSI alone while hospitalized.  Continue home statins and Zetia.  Hypoglycemia resolved.  CBGs reasonably controlled.  Essential hypertension:  Presented with hypotension with SBP in the 80s likely due to GI illness and meds.  Hypotension resolved after IV fluids.  Blood pressures controlled off of home meds (lisinopril and Lasix).  Lisinopril and Lasix held at time of discharge.  This is because her blood pressures are controlled off of antihypertensives, she came with recent acute diarrheal illness and hypotension, has also sustained Intra-Op blood loss with acute blood loss anemia.  Close review during outpatient follow-up regarding resuming these medications.  Hypothyroidism  Continue Synthroid and outpatient follow-up.  Migraine  Continue Topamax.  Tobacco abuse  Cessation counseled.  Asymptomatic bacteriuria  No antibiotics  at this time.  Body mass index is 21.93 kg/m.       Consultants:   Orthopedics  Procedures:   ORIF with IM nail of intertrochanteric right hip fracture on 3/12 by Dr. Berenice Primas.    Discharge Instructions  Discharge Instructions    Call MD for:   Complete by: As directed    Recurrent diarrhea.   Call MD for:  difficulty breathing, headache or visual disturbances   Complete by: As directed    Call MD for:  extreme fatigue   Complete by: As directed    Call MD for:  persistant dizziness or light-headedness   Complete by: As directed    Call MD for:  persistant nausea and vomiting   Complete by: As directed    Call MD for:  redness, tenderness, or signs of infection (pain, swelling, redness, odor or green/yellow discharge around incision site)   Complete by: As directed    Call MD for:  severe uncontrolled pain   Complete by: As directed    Call MD for:  temperature >100.4   Complete  by: As directed    Diet - low sodium heart healthy   Complete by: As directed    Diet Carb Modified   Complete by: As directed    Increase activity slowly   Complete by: As directed    No wound care   Complete by: As directed    No wound care   Complete by: As directed        Medication List    STOP taking these medications   furosemide 20 MG tablet Commonly known as: LASIX   lisinopril 20 MG tablet Commonly known as: ZESTRIL     TAKE these medications   albuterol 108 (90 Base) MCG/ACT inhaler Commonly known as: VENTOLIN HFA Inhale 2 puffs into the lungs every 6 (six) hours as needed for wheezing or shortness of breath.   aspirin 325 MG EC tablet Take 1 tablet (325 mg total) by mouth 2 (two) times daily with a meal.   clonazePAM 1 MG tablet Commonly known as: KLONOPIN Take 1 mg by mouth 3 (three) times daily as needed for anxiety.   cyclobenzaprine 10 MG tablet Commonly known as: FLEXERIL Take 1 tablet by mouth 4 (four) times daily as needed for muscle spasms.   docusate sodium 100 MG capsule Commonly known as: COLACE Take 1 capsule (100 mg total) by mouth 2 (two) times daily.   ezetimibe 10 MG tablet Commonly known as: ZETIA TAKE 1 TABLET (10 MG TOTAL) BY MOUTH DAILY. **NEEDS APT FOR REFILLS**   ferrous sulfate 325 (65 FE) MG tablet Take 1 tablet (325 mg total) by mouth 2 (two) times daily with a meal.   glucose blood test strip 1 each by Other route as needed. Use as instructed   HYDROcodone-acetaminophen 5-325 MG tablet Commonly known as: NORCO/VICODIN Take 1 tablet by mouth every 6 (six) hours as needed for moderate pain or severe pain.   levothyroxine 125 MCG tablet Commonly known as: SYNTHROID Take 1 tablet (125 mcg total) by mouth daily. **NEEDS APT FOR REFILLS**   Linzess 72 MCG capsule Generic drug: linaclotide TAKE 1 CAPSULE (72 MCG TOTAL) BY MOUTH DAILY BEFORE BREAKFAST.   metFORMIN 1000 MG tablet Commonly known as: GLUCOPHAGE TAKE 1  TABLET (1,000 MG TOTAL) BY MOUTH 2 (TWO) TIMES DAILY WITH A MEAL. **NEEDS APT FOR REFILLS**   Narcan 4 MG/0.1ML Liqd nasal spray kit Generic drug: naloxone Place 1 spray into the  nose once. PRN Overdose   pantoprazole 20 MG tablet Commonly known as: PROTONIX *NEED OFFICE VISIT* TAKE 1 TABLET BY MOUTH EVERY DAY   QUEtiapine 300 MG tablet Commonly known as: SEROQUEL Take 300 mg by mouth at bedtime.   simvastatin 40 MG tablet Commonly known as: ZOCOR Take 1 tablet (40 mg total) by mouth daily at 6 PM. **NEEDS APT FOR REFILLS**   topiramate 100 MG tablet Commonly known as: TOPAMAX Take 1 tablet (100 mg total) by mouth at bedtime. Max dose is 121m at bedtime   venlafaxine XR 150 MG 24 hr capsule Commonly known as: EFFEXOR-XR Take 2 capsules (300 mg total) by mouth daily with breakfast.      Allergies  Allergen Reactions  . Other Hives    neoprene  . Penicillins Rash      Procedures/Studies: CT Abdomen Pelvis W Contrast  Result Date: 09/02/2020 CLINICAL DATA:  Abdominal pain and diarrhea since Monday. Patient fell. EXAM: CT ABDOMEN AND PELVIS WITH CONTRAST TECHNIQUE: Multidetector CT imaging of the abdomen and pelvis was performed using the standard protocol following bolus administration of intravenous contrast. CONTRAST:  1014mOMNIPAQUE IOHEXOL 300 MG/ML  SOLN COMPARISON:  None. FINDINGS: Lower chest: The lung bases are clear of acute process. No pleural effusion or pulmonary lesions. Scattered age advanced coronary artery calcifications and aortic calcifications. The heart is normal in size. No pericardial effusion. The distal esophagus and aorta are unremarkable. Hepatobiliary: No hepatic lesions or intrahepatic biliary dilatation. The gallbladder is surgically absent. No common bile duct dilatation. Pancreas: Moderate pancreatic atrophy but no mass or inflammation. Spleen: Normal size.  No focal lesions. Adrenals/Urinary Tract: The adrenal glands and kidneys are unremarkable.  No worrisome renal lesions or hydronephrosis. The bladder is unremarkable. Stomach/Bowel: The stomach, duodenum, small bowel and colon are unremarkable. No acute inflammatory changes, mass lesions or obstructive findings. The terminal ileum is normal. The appendix is normal. Vascular/Lymphatic: Age advanced atherosclerotic calcifications involving the aorta and iliac arteries. No aneurysm or dissection. The major venous structures are patent. No mesenteric or retroperitoneal mass or adenopathy. Reproductive: Surgically absent. Other: No pelvic mass or adenopathy. No free pelvic fluid collections. No inguinal mass or adenopathy. No abdominal wall hernia or subcutaneous lesions. Musculoskeletal: Comminuted intertrochanteric fracture of the right hip. Surrounding hematoma noted. IMPRESSION: 1. No acute abdominal/pelvic findings, mass lesions or adenopathy. 2. Status post cholecystectomy. No biliary dilatation. 3. Intertrochanteric fracture of the right hip. 4. Age advanced atherosclerotic calcifications involving the aorta and iliac arteries. Aortic Atherosclerosis (ICD10-I70.0). Electronically Signed   By: P.Marijo Sanes.D.   On: 09/02/2020 19:45   Pelvis Portable  Result Date: 09/03/2020 CLINICAL DATA:  Perioperative EXAM: PORTABLE PELVIS 1-2 VIEWS COMPARISON:  None. FINDINGS: Intramedullary nail fixation of displaced intertrochanteric fractures of the right femur. No evidence of perihardware fracture or component malposition. Expected overlying postoperative changes. IMPRESSION: Intramedullary nail fixation of displaced intertrochanteric fractures of the right femur. Electronically Signed   By: AlEddie Candle.D.   On: 09/03/2020 17:45   DG Chest Port 1 View  Result Date: 09/02/2020 CLINICAL DATA:  [place] Right hip pain. EXAM: PORTABLE CHEST 1 VIEW COMPARISON:  05/25/2017 FINDINGS: The cardiac silhouette, mediastinal and hilar contours are within normal limits and stable. No acute pulmonary findings. No  pulmonary lesions. The bony thorax is intact.  Remote healed rib fractures are noted. IMPRESSION: No acute cardiopulmonary findings. Electronically Signed   By: P.Marijo Sanes.D.   On: 09/02/2020 16:23   DG C-Arm 1-60  Min-No Report  Result Date: 09/03/2020 Fluoroscopy was utilized by the requesting physician.  No radiographic interpretation.   DG HIP OPERATIVE UNILAT W OR W/O PELVIS RIGHT  Result Date: 09/03/2020 CLINICAL DATA:  Right hip fracture repair EXAM: OPERATIVE RIGHT HIP (WITH PELVIS IF PERFORMED) 4 VIEWS TECHNIQUE: Fluoroscopic spot image(s) were submitted for interpretation post-operatively. COMPARISON:  None. FINDINGS: Four images were obtained during the right hip fracture repair. A gamma nail crosses the fracture extending into the femoral neck. An intramedullary femoral rod is also identified with the distal interosseous screw. Hardware is in good position. IMPRESSION: Right hip fracture repair as above. Electronically Signed   By: Dorise Bullion III M.D   On: 09/03/2020 15:34   DG Hip Unilat With Pelvis 2-3 Views Right  Result Date: 09/02/2020 CLINICAL DATA:  [place] today.  Right hip pain. EXAM: DG HIP (WITH OR WITHOUT PELVIS) 2-3V RIGHT COMPARISON:  None. FINDINGS: There is a displaced and comminuted intertrochanteric fracture of the right hip. The left hip is intact.  No acute fracture. The bony pelvis is intact.  No pelvic fractures or bone lesions. IMPRESSION: Displaced and comminuted intertrochanteric fracture of the right hip. Electronically Signed   By: Marijo Sanes M.D.   On: 09/02/2020 16:22      Subjective: Denies complaints.  Some expected postop right hip pain but states that this is gradually getting better.  No BM since admission but passing flatus.  Denies any other complaints.  As per RN, no acute issues noted.  Discharge Exam:  Vitals:   09/05/20 0532 09/05/20 1400 09/05/20 2006 09/06/20 0401  BP: 120/77 127/66 (!) 130/101 139/73  Pulse: 92 74 86 89  Resp:  '18 16 18 18  ' Temp: 98 F (36.7 C) 97.8 F (36.6 C) 98.8 F (37.1 C) 98.6 F (37 C)  TempSrc: Oral Oral Oral Oral  SpO2: 99% 100% 100% 97%  Weight:      Height:         General exam: Middle-age female, moderately built and thinly nourished lying comfortably propped up in bed without distress.  Oral mucosa moist. Respiratory system: Clear to auscultation.  No increased work of breathing. Cardiovascular system: S1 and S2 heard, RRR.  No JVD, murmurs or pedal edema. Gastrointestinal system: Abdomen is nondistended, soft and nontender. No organomegaly or masses felt. Normal bowel sounds heard. Central nervous system: Alert and oriented. No focal neurological deficits. Extremities: Symmetric 5 x 5 power.  Right hip surgical site Aquacel dressing clean, dry and without acute findings. Skin: No rashes, lesions or ulcers Psychiatry: Judgement and insight appear normal. Mood & affect appropriate.     The results of significant diagnostics from this hospitalization (including imaging, microbiology, ancillary and laboratory) are listed below for reference.     Microbiology: Recent Results (from the past 240 hour(s))  SARS CORONAVIRUS 2 (TAT 6-24 HRS) Nasopharyngeal Nasopharyngeal Swab     Status: None   Collection Time: 09/02/20  4:08 PM   Specimen: Nasopharyngeal Swab  Result Value Ref Range Status   SARS Coronavirus 2 NEGATIVE NEGATIVE Final    Comment: (NOTE) SARS-CoV-2 target nucleic acids are NOT DETECTED.  The SARS-CoV-2 RNA is generally detectable in upper and lower respiratory specimens during the acute phase of infection. Negative results do not preclude SARS-CoV-2 infection, do not rule out co-infections with other pathogens, and should not be used as the sole basis for treatment or other patient management decisions. Negative results must be combined with clinical observations, patient history, and epidemiological  information. The expected result is Negative.  Fact Sheet  for Patients: SugarRoll.be  Fact Sheet for Healthcare Providers: https://www.woods-mathews.com/  This test is not yet approved or cleared by the [place] FDA and  has been authorized for detection and/or diagnosis of SARS-CoV-2 by FDA under an Emergency Use Authorization (EUA). This EUA will remain  in effect (meaning this test can be used) for the duration of the COVID-19 declaration under Se ction 564(b)(1) of the Act, 21 U.S.C. section 360bbb-3(b)(1), unless the authorization is terminated or revoked sooner.  Performed at [facility], [place] [address] [place], [place] [address]   Culture, Urine     Status: Abnormal   Collection Time: 09/02/20  8:34 PM   Specimen: Urine, Random  Result Value Ref Range Status   Specimen Description   Final    URINE, RANDOM Performed at [facility] [address], [place], [place] [address]    Special Requests   Final    NONE Performed at [facility], [place] [address], [place], [place] [address]    Culture >=100,000 COLONIES/mL ESCHERICHIA COLI (A)  Final   Report Status 09/05/2020 FINAL  Final   Organism ID, Bacteria ESCHERICHIA COLI (A)  Final      Susceptibility   Escherichia coli - MIC*    AMPICILLIN 4 SENSITIVE Sensitive     CEFAZOLIN <=4 SENSITIVE Sensitive     CEFEPIME <=0.12 SENSITIVE Sensitive     CEFTRIAXONE <=0.25 SENSITIVE Sensitive     CIPROFLOXACIN <=0.25 SENSITIVE Sensitive     GENTAMICIN <=1 SENSITIVE Sensitive     IMIPENEM <=0.25 SENSITIVE Sensitive     NITROFURANTOIN <=16 SENSITIVE Sensitive     TRIMETH/SULFA <=20 SENSITIVE Sensitive     AMPICILLIN/SULBACTAM <=2 SENSITIVE Sensitive     PIP/TAZO <=4 SENSITIVE Sensitive     * >=100,000 COLONIES/mL ESCHERICHIA COLI     Labs: CBC: Recent Labs  Lab 09/02/20 1545 09/03/20 0421 09/04/20 0415 09/05/20 0317 09/06/20 0330  WBC 17.1* 9.9 9.1 8.3 7.1  NEUTROABS 14.8*  --   --   --    --   HGB 14.6 11.4* 9.8* 9.5* 8.9*  HCT 39.9 32.4* 27.5* 27.1* 25.6*  MCV 89.3 91.3 90.8 91.9 93.4  PLT 310 259 219 209 505    Basic Metabolic Panel: Recent Labs  Lab 09/02/20 1545 09/03/20 0421 09/03/20 1141 09/04/20 0415 09/05/20 0317 09/05/20 1206 09/06/20 0330  NA 132*   < > 135 131* 135 132* 135  K 2.4*   < > 3.4* 3.5 2.9* 3.8 3.8  CL 84*   < > 99 96* 100 100 104  CO2 22   < > '26 28 25 25 24  ' GLUCOSE 162*   < > 123* 195* 116* 167* 102*  BUN 21*   < > '10 6 9 11 10  ' CREATININE 1.37*   < > 0.82 0.69 0.72 0.60 0.66  CALCIUM 9.2   < > 8.2* 8.0* 8.2* 8.1* 8.2*  MG 1.9  --   --   --  1.5* 3.2*  --    < > = values in this interval not displayed.    Liver Function Tests: No results for input(s): AST, ALT, ALKPHOS, BILITOT, PROT, ALBUMIN in the last 168 hours.  CBG: Recent Labs  Lab 09/05/20 1122 09/05/20 1726 09/05/20 2124 09/06/20 0757 09/06/20 1144  GLUCAP 173* 147* 139* 95 129*    Urinalysis    Component Value Date/Time   COLORURINE YELLOW 09/02/2020  2034   APPEARANCEUR CLEAR 09/02/2020 2034   LABSPEC 1.024 09/02/2020 2034   PHURINE 5.0 09/02/2020 2034   GLUCOSEU NEGATIVE 09/02/2020 2034   HGBUR SMALL (A) 09/02/2020 2034   BILIRUBINUR NEGATIVE 09/02/2020 2034   KETONESUR 80 (A) 09/02/2020 2034   PROTEINUR NEGATIVE 09/02/2020 2034   NITRITE POSITIVE (A) 09/02/2020 2034   LEUKOCYTESUR SMALL (A) 09/02/2020 2034    Has repeatedly declined MDs offer to speak with her family to update care or answer questions.  She states that she will and has been updating them.  Time coordinating discharge: 40 minutes  SIGNED:  Vernell Leep, MD, [place], [facility]. [facility]  To contact the attending provider between 7A-7P or the covering provider during after hours 7P-7A, please log into the web site [email or web address] and access using universal [place] password for that web site. If you do not have the password, please call the hospital operator.

## 2020-09-05 NOTE — Care Management Important Message (Signed)
Important Message  Patient Details IM Letter given to the Patient. Name: Alejandra Taylor MRN: [id number] Date of Birth: June 29, 1960   Medicare Important Message Given:  Yes     Kerin Salen 09/05/2020, 12:16 PM

## 2020-09-05 NOTE — TOC Progression Note (Signed)
Transition of Care [facility]) - Progression Note    Patient Details  Name: Alejandra Taylor MRN: [id number] Date of Birth: 11/08/1960  Transition of Care [facility] CM/SW Contact  Ross Ludwig, [place] Phone Number: 09/05/2020, 3:36 PM  Clinical Narrative:     Patient has been set up with [facility] for home health PT.  CSW also ordered a rolling walker from [place].  Walker was delivered to the patient's room today.     Expected Discharge Plan and Services  Plan to go home with home health PT.         Expected Discharge Date: 09/05/20                                     Social Determinants of Health (SDOH) Interventions    Readmission Risk Interventions No flowsheet data found.

## 2020-09-05 NOTE — Progress Notes (Signed)
Physical Therapy Treatment Patient Details Name: Alejandra Taylor MRN: [id number] DOB: [birth year] Today's Date: 09/05/2020    History of Present Illness 60 yo female admitted with IT fx of R femur-s/p ORIF 09/03/20. Hx of CKD, OA, chronic pain, spinal stenosis, falls, PTSD, DM, endometrial ca.    PT Comments    Progressing slowly with mobility. Pt rated pain 9/10 with ambulation. She walked ~18 feet with a RW. Min-Mod assist for mobility on today. Pt needs to continue gait training and begin stair training on tomorrow. Will continue to follow and progress activity as able. Asked nursing to try to get pt to use BSC or walk to bathroom, to increase activity, instead of using the purewick and remaining in bed all the time.    Follow Up Recommendations  Home health PT;Supervision/Assistance - 24 hour     Equipment Recommendations  Rolling walker with 5" wheels    Recommendations for Other Services       Precautions / Restrictions Precautions Precautions: Fall Restrictions Weight Bearing Restrictions: No Other Position/Activity Restrictions: WBAT    Mobility  Bed Mobility Overal bed mobility: Needs Assistance Bed Mobility: Supine to Sit;Sit to Supine     Supine to sit: Min assist;HOB elevated Sit to supine: Min assist;HOB elevated   General bed mobility comments: Assist for R LE. Increased time.    Transfers Overall transfer level: Needs assistance Equipment used: Rolling walker (2 wheeled) Transfers: Sit to/from Stand Sit to Stand: From elevated surface;Mod assist         General transfer comment: Assist to rise, steady, control descent. Cues for safety, technique, hand placement.  Increased time.  Ambulation/Gait Ambulation/Gait assistance: Min assist Gait Distance (Feet): 18 Feet Assistive device: Rolling walker (2 wheeled) Gait Pattern/deviations: Step-to pattern;Antalgic;Decreased stance time - right     General Gait Details: Assist to stabilize. Cues for  safety, technique, sequence. Distance limited by pain.   Stairs             Wheelchair Mobility    Modified Rankin (Stroke Patients Only)       Balance Overall balance assessment: Needs assistance;History of Falls         Standing balance support: Bilateral upper extremity supported Standing balance-Leahy Scale: Poor                              Cognition                                              Exercises      General Comments        Pertinent Vitals/Pain Pain Assessment: 0-10 Pain Score: 9  Pain Location: R LE with activity    Home Living Family/patient expects to be discharged to:: Private residence Living Arrangements: Other relatives;Parent (Pt to discharge to Mother's home.  Home information below pertains to pt's mother's home. Mother can provide light assistance and 24/7 supervision.) Available Help at Discharge: Available 24 hours/day Type of Home: House   Entrance Stairs-Rails: None Home Layout: One level        Prior Function            PT Goals (current goals can now be found in the care plan section) Progress towards PT goals: Progressing toward goals    Frequency    Min 5X/week  PT Plan Current plan remains appropriate    Co-evaluation              AM-PAC PT "6 Clicks" Mobility   Outcome Measure  Help needed turning from your back to your side while in a flat bed without using bedrails?: A Little Help needed moving from lying on your back to sitting on the side of a flat bed without using bedrails?: A Little Help needed moving to and from a bed to a chair (including a wheelchair)?: A Little Help needed standing up from a chair using your arms (e.g., wheelchair or bedside chair)?: A Lot Help needed to walk in hospital room?: A Little Help needed climbing 3-5 steps with a railing? : A Lot 6 Click Score: 16    End of Session Equipment Utilized During Treatment: Gait  belt Activity Tolerance: Patient limited by pain Patient left: in bed;with call bell/phone within reach;with bed alarm set (pt declined sitting in recliner)   PT Visit Diagnosis: Pain;Other abnormalities of gait and mobility (R26.89);History of falling (Z91.81) Pain - Right/Left: Right Pain - part of body: Hip     Time: 4174-0814 PT Time Calculation (min) (ACUTE ONLY): 19 min  Charges:  $Gait Training: 8-22 mins                        [name], PT Acute Rehabilitation  Office: [phone] Pager: [phone]

## 2020-09-05 NOTE — Progress Notes (Signed)
PROGRESS NOTE   Alejandra Taylor  WGN:[id number]    DOB: 06/29/1960    DOA: 09/02/2020  PCP: Lorrene Reid, PA-C   I have briefly reviewed patients previous medical records in [facility].  Chief Complaint  Patient presents with  . Diarrhea  . Hip Pain    right    Brief Narrative:  60 year old married female, independent, medical history significant for but not limited to CKD 2, depression, anxiety, PTSD, type II DM, endometrial CA in 2011, gastritis, hyperlipidemia, hypertension, hypothyroidism, migraine, chronic back pain and colitis, presented with 4 days of diarrheal illness, a mechanical fall 4 days ago with associated right hip pain and difficulty ambulating.  Admitted for acute diarrhea, right hip fracture, AKI and hypokalemia.  Orthopedics consulted, s/p ORIF with IM rod 3/12.  Progressing postop.  Per PT, not yet ready for DC home from their standpoint, possibly tomorrow.   Assessment & Plan:  Principal Problem:   Intertrochanteric fracture of right femur (HCC) Active Problems:   Migraine with status migrainosus, not intractable   Hypertension associated with diabetes ([place])   PTSD (post-traumatic stress disorder)   Hypothyroidism   Hyperlipidemia associated with type 2 diabetes mellitus ([place])   Depression with anxiety   Acute renal failure superimposed on stage 2 chronic kidney disease (HCC)   Hyponatremia   Diabetes mellitus without complication (HCC)   Gastritis without bleeding   Diarrhea   Hypokalemia   Leukocytosis   Osteoporosis   Intertrochanteric fracture of right hip (HCC)   Acute diarrhea  Suspect food poisoning versus acute GE.  CT abdomen without acute findings.  Resolved without recurrence.  Discontinued contact isolation.  Intertrochanteric right hip fracture, s/p ORIF with IM nail 3/12.  Sustained s/p mechanical fall.  Orthopedics consulted and patient is s/p ORIF with IM nail on 3/12.  Suspect some degree of osteoporosis given age,  ongoing tobacco.  Check vitamin D levels.  Consider outpatient bone density scan.  Continues to progress postoperatively.  PT indicates that patient is not yet ready for DC home from their standpoint, hopefully tomorrow.  They recommend home health PT with rolling walker with 5 inch wheels.  OT input pending.  On aspirin 325 mg daily for postop DVT prophylaxis per orthopedics.  Acute kidney injury complicating stage II CKD  Secondary to diarrhea and dehydration.  Baseline creatinine around 0.9.  Presented with creatinine of 1.37.  AKI resolved post hydration.  Hypokalemia  Secondary to diarrheal illness.  Potassium 2.9, up to 3.8 post aggressive replacement  Magnesium 1.5, s/p magnesium sulfate IV 4 g, follow post replacement labs.  Leukocytosis  Suspect stress margination from acute diarrhea, fall and fracture.  No clinical concern for infectious etiology.  Resolved.  Postop acute blood loss anemia  Baseline hemoglobin probably in the 14 g range.  This is gradually dropped to 9 g range and stable.  EBL Intra-Op 175 mL.  Follow CBC daily and transfuse if hemoglobin 7 g or less.  Dehydration with hyponatremia  Secondary to GI losses.  Resolved.  Discontinued IV fluids.  Oral intake ad lib.  Depression/anxiety/PTSD  Follows with outpatient psych/Dr. Noemi Chapel  Continue prior home meds.  Patient states that she is taking Klonopin 3 times daily as needed and insisted on adjusting to same.  Type II DM with hyperlipidemia/hypoglycemia  Holding home Metformin.  Well-controlled on SSI alone.  Continue home statins and Zetia.  Hypoglycemia resolved.  CBGs reasonably controlled.  Essential hypertension:  Presented with hypotension with SBP in the  80s likely due to GI illness and meds.  Hypotension resolved after IV fluids.  Blood pressures controlled off of home meds (lisinopril and Lasix).  Continue to monitor.  Likely DC home without these meds until  close outpatient follow-up with PCP.  Hypothyroidism  Continue Synthroid and outpatient follow-up.  Migraine  Continue Topamax.  Tobacco abuse  Cessation counseled.  Asymptomatic bacteriuria  No antibiotics at this time.  Body mass index is 21.93 kg/m.    DVT prophylaxis: SCDs Start: 09/03/20 1622 SCDs Start: 09/02/20 2022     Code Status: Full Code Family Communication: None at bedside.  Patient declined MDs offer to update family.  She stated that she would do it herself. Disposition:  Status is: Inpatient  Remains inpatient appropriate because:Inpatient level of care appropriate due to severity of illness   Dispo: The patient is from: Home              Anticipated d/c is to: Home with home health possibly 3/15              Patient currently is not medically stable to d/c.   Difficult to place patient No        Consultants:   Orthopedics  Procedures:   ORIF with IM nail of intertrochanteric right hip fracture on 3/12 by Dr. Berenice Primas.  Antimicrobials:    Anti-infectives (From admission, onward)   Start     Dose/Rate Route Frequency Ordered Stop   09/03/20 2000  vancomycin (VANCOCIN) IVPB 1000 mg/200 mL premix        1,000 mg 200 mL/hr over 60 Minutes Intravenous Every 12 hours 09/03/20 1621 09/04/20 0044   09/03/20 1630  ceFAZolin (ANCEF) IVPB 2g/100 mL premix  Status:  Discontinued        2 g 200 mL/hr over 30 Minutes Intravenous On call to O.R. 09/03/20 1621 09/03/20 1628   09/03/20 1249  ceFAZolin (ANCEF) 2-4 GM/100ML-% IVPB       Note to Pharmacy: Octavio Graves   : cabinet override      09/03/20 1249 09/04/20 0059        Subjective:  Denies complaints.  Expected postop right hip pain, moderate.  No diarrhea, in fact no BM since hospital admission.  Tolerating diet without nausea or vomiting.  States that she will be going and staying with her mother who will be able to assist her 24/7 along with her brothers who can come by.  She will not be  going home to her spouse right now because he is a dialysis patient and may not be available at all times.  Objective:   Vitals:   09/04/20 0505 09/04/20 1351 09/04/20 2030 09/05/20 0532  BP: 119/71 118/76 (!) 172/95 120/77  Pulse: 96 89 87 92  Resp: 19 17 20 18   Temp:  98.3 F (36.8 C)  98 F (36.7 C)  TempSrc:    Oral  SpO2: 98% 99% 100% 99%  Weight:      Height:        General exam: Middle-age female, moderately built and thinly nourished lying comfortably propped up in bed without distress.  Oral mucosa moist. Respiratory system: Clear to auscultation.  No increased work of breathing. Cardiovascular system: S1 and S2 heard, RRR.  No JVD, murmurs or pedal edema. Gastrointestinal system: Abdomen is nondistended, soft and nontender. No organomegaly or masses felt. Normal bowel sounds heard. Central nervous system: Alert and oriented. No focal neurological deficits. Extremities: Symmetric 5 x 5 power.  Right  hip surgical site Aquacel dressing clean, dry and without acute findings. Skin: No rashes, lesions or ulcers Psychiatry: Judgement and insight appear normal. Mood & affect appropriate.     Data Reviewed:   I have personally reviewed following labs and imaging studies   CBC: Recent Labs  Lab 09/02/20 1545 09/03/20 0421 09/04/20 0415 09/05/20 0317  WBC 17.1* 9.9 9.1 8.3  NEUTROABS 14.8*  --   --   --   HGB 14.6 11.4* 9.8* 9.5*  HCT 39.9 32.4* 27.5* 27.1*  MCV 89.3 91.3 90.8 91.9  PLT 310 259 219 161    Basic Metabolic Panel: Recent Labs  Lab 09/02/20 1545 09/03/20 0421 09/04/20 0415 09/05/20 0317 09/05/20 1206  NA 132*   < > 131* 135 132*  K 2.4*   < > 3.5 2.9* 3.8  CL 84*   < > 96* 100 100  CO2 22   < > 28 25 25   GLUCOSE 162*   < > 195* 116* 167*  BUN 21*   < > 6 9 11   CREATININE 1.37*   < > 0.69 0.72 0.60  CALCIUM 9.2   < > 8.0* 8.2* 8.1*  MG 1.9  --   --  1.5*  --    < > = values in this interval not displayed.    Liver Function Tests: No  results for input(s): AST, ALT, ALKPHOS, BILITOT, PROT, ALBUMIN in the last 168 hours.  CBG: Recent Labs  Lab 09/04/20 2033 09/05/20 0748 09/05/20 1122  GLUCAP 145* 100* 173*    Microbiology Studies:   Recent Results (from the past 240 hour(s))  SARS CORONAVIRUS 2 (TAT 6-24 HRS) Nasopharyngeal Nasopharyngeal Swab     Status: None   Collection Time: 09/02/20  4:08 PM   Specimen: Nasopharyngeal Swab  Result Value Ref Range Status   SARS Coronavirus 2 NEGATIVE NEGATIVE Final    Comment: (NOTE) SARS-CoV-2 target nucleic acids are NOT DETECTED.  The SARS-CoV-2 RNA is generally detectable in upper and lower respiratory specimens during the acute phase of infection. Negative results do not preclude SARS-CoV-2 infection, do not rule out co-infections with other pathogens, and should not be used as the sole basis for treatment or other patient management decisions. Negative results must be combined with clinical observations, patient history, and epidemiological information. The expected result is Negative.  Fact Sheet for Patients: SugarRoll.be  Fact Sheet for Healthcare Providers: https://www.woods-mathews.com/  This test is not yet approved or cleared by the [place] FDA and  has been authorized for detection and/or diagnosis of SARS-CoV-2 by FDA under an Emergency Use Authorization (EUA). This EUA will remain  in effect (meaning this test can be used) for the duration of the COVID-19 declaration under Se ction 564(b)(1) of the Act, 21 U.S.C. section 360bbb-3(b)(1), unless the authorization is terminated or revoked sooner.  Performed at [facility], [place] [address] [place], [place] [address]   Culture, Urine     Status: Abnormal   Collection Time: 09/02/20  8:34 PM   Specimen: Urine, Random  Result Value Ref Range Status   Specimen Description   Final    URINE, RANDOM Performed at [place] [address] [place], [place] [address]    Special Requests   Final    NONE Performed at [facility], [place] [address] [place], [place] [address]    Culture >=100,000 COLONIES/mL ESCHERICHIA COLI (A)  Final   Report Status 09/05/2020 FINAL  Final  Organism ID, Bacteria ESCHERICHIA COLI (A)  Final      Susceptibility   Escherichia coli - MIC*    AMPICILLIN 4 SENSITIVE Sensitive     CEFAZOLIN <=4 SENSITIVE Sensitive     CEFEPIME <=0.12 SENSITIVE Sensitive     CEFTRIAXONE <=0.25 SENSITIVE Sensitive     CIPROFLOXACIN <=0.25 SENSITIVE Sensitive     GENTAMICIN <=1 SENSITIVE Sensitive     IMIPENEM <=0.25 SENSITIVE Sensitive     NITROFURANTOIN <=16 SENSITIVE Sensitive     TRIMETH/SULFA <=20 SENSITIVE Sensitive     AMPICILLIN/SULBACTAM <=2 SENSITIVE Sensitive     PIP/TAZO <=4 SENSITIVE Sensitive     * >=100,000 COLONIES/mL ESCHERICHIA COLI     Radiology Studies:  Pelvis Portable  Result Date: 09/03/2020 CLINICAL DATA:  Perioperative EXAM: PORTABLE PELVIS 1-2 VIEWS COMPARISON:  None. FINDINGS: Intramedullary nail fixation of displaced intertrochanteric fractures of the right femur. No evidence of perihardware fracture or component malposition. Expected overlying postoperative changes. IMPRESSION: Intramedullary nail fixation of displaced intertrochanteric fractures of the right femur. Electronically Signed   By: Eddie Candle M.D.   On: 09/03/2020 17:45   DG C-Arm 1-60 Min-No Report  Result Date: 09/03/2020 Fluoroscopy was utilized by the requesting physician.  No radiographic interpretation.   DG HIP OPERATIVE UNILAT W OR W/O PELVIS RIGHT  Result Date: 09/03/2020 CLINICAL DATA:  Right hip fracture repair EXAM: OPERATIVE RIGHT HIP (WITH PELVIS IF PERFORMED) 4 VIEWS TECHNIQUE: Fluoroscopic spot image(s) were submitted for interpretation post-operatively. COMPARISON:  None. FINDINGS: Four images were obtained during the right hip fracture repair. A gamma nail crosses the  fracture extending into the femoral neck. An intramedullary femoral rod is also identified with the distal interosseous screw. Hardware is in good position. IMPRESSION: Right hip fracture repair as above. Electronically Signed   By: Dorise Bullion III M.D   On: 09/03/2020 15:34     Scheduled Meds:   . aspirin EC  325 mg Oral Q breakfast  . Chlorhexidine Gluconate Cloth  6 each Topical Daily  . docusate sodium  100 mg Oral BID  . ezetimibe  10 mg Oral Daily  . ferrous sulfate  325 mg Oral BID WC  . insulin aspart  0-9 Units Subcutaneous TID WC  . levothyroxine  125 mcg Oral Q0600  . pantoprazole  40 mg Oral Daily  . QUEtiapine  300 mg Oral QHS  . simvastatin  40 mg Oral q1800  . topiramate  100 mg Oral QHS  . venlafaxine XR  300 mg Oral Q breakfast    Continuous Infusions:   . methocarbamol (ROBAXIN) IV Stopped (09/03/20 1651)     LOS: 3 days     Vernell Leep, MD, [place], [facility]. [facility]    To contact the attending provider between 7A-7P or the covering provider during after hours 7P-7A, please log into the web site [email or web address] and access using universal [place] password for that web site. If you do not have the password, please call the hospital operator.  09/05/2020, 1:41 PM

## 2020-09-06 LAB — BASIC METABOLIC PANEL
Anion gap: 7 (ref 5–15)
BUN: 10 mg/dL (ref 6–20)
CO2: 24 mmol/L (ref 22–32)
Calcium: 8.2 mg/dL — ABNORMAL LOW (ref 8.9–10.3)
Chloride: 104 mmol/L (ref 98–111)
Creatinine, Ser: 0.66 mg/dL (ref 0.44–1.00)
GFR, Estimated: >60 mL/min (ref >60)
Glucose, Bld: 102 mg/dL — ABNORMAL HIGH (ref 70–99)
Potassium: 3.8 mmol/L (ref 3.5–5.1)
Sodium: 135 mmol/L (ref 135–145)

## 2020-09-06 LAB — CBC
HCT: 25.6 % — ABNORMAL LOW (ref 36.0–46.0)
Hemoglobin: 8.9 g/dL — ABNORMAL LOW (ref 12.0–15.0)
MCH: 32.5 pg (ref 26.0–34.0)
MCHC: 34.8 g/dL (ref 30.0–36.0)
MCV: 93.4 fL (ref 80.0–100.0)
Platelets: 246 10*3/uL (ref 150–400)
RBC: 2.74 MIL/uL — ABNORMAL LOW (ref 3.87–5.11)
RDW: 12.5 % (ref 11.5–15.5)
WBC: 7.1 10*3/uL (ref 4.0–10.5)
nRBC: 0 % (ref 0.0–0.2)

## 2020-09-06 LAB — GLUCOSE, CAPILLARY
Glucose-Capillary: 95 mg/dL (ref 70–99)
Glucose-Capillary: 129 mg/dL — ABNORMAL HIGH (ref 70–99)

## 2020-09-06 MED ORDER — HYDROCODONE-ACETAMINOPHEN 5-325 MG PO TABS: 1.0000 | ORAL_TABLET | Freq: Four times a day (QID) | ORAL | 0 refills | Status: DC | PRN

## 2020-09-06 MED ORDER — ASPIRIN 325 MG PO TBEC: 325.0000 mg | DELAYED_RELEASE_TABLET | Freq: Two times a day (BID) | ORAL | 0 refills | Status: AC

## 2020-09-06 NOTE — Progress Notes (Signed)
Subjective: 3 Days Post-Op Procedure(s) (LRB): INTRAMEDULLARY (IM) NAIL FEMORAL (Right) Patient reports pain as severe.  She is able to carry on a conversation and is able to do so while she is moving her leg.  Objective: Vital signs in last 24 hours: Temp:  [97.8 F (36.6 C)-98.8 F (37.1 C)] 98.6 F (37 C) (03/15 0401) Pulse Rate:  [74-89] 89 (03/15 0401) Resp:  [16-18] 18 (03/15 0401) BP: (127-139)/(66-101) 139/73 (03/15 0401) SpO2:  [97 %-100 %] 97 % (03/15 0401)  Intake/Output from previous day: 03/14 0701 - 03/15 0700 In: 100 [IV Piggyback:100] Out: 1300 [Urine:1300] Intake/Output this shift: No intake/output data recorded.  Recent Labs    09/04/20 0415 09/05/20 0317 09/06/20 0330  HGB 9.8* 9.5* 8.9*   Recent Labs    09/05/20 0317 09/06/20 0330  WBC 8.3 7.1  RBC 2.95* 2.74*  HCT 27.1* 25.6*  PLT 209 246   Recent Labs    09/05/20 1206 09/06/20 0330  NA 132* 135  K 3.8 3.8  CL 100 104  CO2 25 24  BUN 11 10  CREATININE 0.60 0.66  GLUCOSE 167* 102*  CALCIUM 8.1* 8.2*   No results for input(s): LABPT, INR in the last 72 hours.  Neurologically intact ABD soft Neurovascular intact Sensation intact distally Intact pulses distally No cellulitis present Compartment soft   Assessment/Plan: 3 Days Post-Op Procedure(s) (LRB): INTRAMEDULLARY (IM) NAIL FEMORAL (Right) Advance diet Up with therapy Discharge home with home health.  The patient will need to be on aspirin (325 mg twice a day.  The patient should go home on oxycodone 5 mg 1-2 every 6 hours when necessary.  I am a bit concerned about pain control and her but clearly she has an excellent result from her intramedullary rod and minimal swelling around the hip.  I think standard pain medicine regimens would be all that she would need.  I will plan on seeing her back in the office in 10 days for recheck and be happy to see her at any point prior should issues arise.      Alta Corning 09/06/2020,  11:17 AM

## 2020-09-06 NOTE — TOC Transition Note (Signed)
Transition of Care [facility]) - CM/SW Discharge Note   Patient Details  Name: Alejandra Taylor MRN: [id number] Date of Birth: 07-Sep-1960  Transition of Care [facility]) CM/SW Contact:  Ross Ludwig, LCSW Phone Number: 09/06/2020, 1:49 PM   Clinical Narrative:     Patient will be going home with home health through [facility].  CSW signing off please reconsult with any other social work needs, home health agency has been notified of planned discharge.  Patient will be going to her mother's house, per patient her mother can be contacted at [phone], [address], [place], [place], [address].  CSW notified [facility].  Final next level of care: [place] Barriers to Discharge: Barriers Resolved   Patient Goals and CMS Choice Patient states their goals for this hospitalization and ongoing recovery are:: To go to her her mother's house temporaily with home health serrvices, then return back home. CMS Medicare.gov Compare Post Acute Care list provided to:: Patient Choice offered to / list presented to : Patient  Discharge Placement  Patient discharging to her mother's house.                     Discharge Plan and Services                DME Arranged: Walker,Bedside commode DME Agency: Other - Comment [facility]) Date DME Agency Contacted: 09/06/20 Time DME Agency Contacted: 1100 Representative spoke with at DME Agency: [facility]on Grills HH Arranged: PT,OT [place]: [facility] (formerly [employer]) Date [place]: 09/05/20 Time [place]: 1400 Representative spoke with at [place]: [place] ([place]) Interventions     Readmission Risk Interventions No flowsheet data found.

## 2020-09-06 NOTE — Progress Notes (Addendum)
Physical Therapy Treatment Patient Details Name: Alejandra Taylor MRN: [id number] DOB: [birth year] Today's Date: 09/06/2020    History of Present Illness 60 yo female admitted with IT fx of R femur-s/p ORIF 09/03/20. Hx of CKD, OA, chronic pain, spinal stenosis, falls, PTSD, DM, endometrial ca.    PT Comments    Progressing with mobility. Reviewed and practiced exercises, gait training, and stair training. All education completed. Okay to d/c from PT standpoint-made RN aware.  Pt will need HHPT and HHOT arranged to go out to her mother's address (pt will be staying there temporarily).   Follow Up Recommendations  Home health PT;Supervision/Assistance - 24 hour     Equipment Recommendations  Rolling walker with 5" wheels    Recommendations for Other Services       Precautions / Restrictions Precautions Precautions: Fall Restrictions Weight Bearing Restrictions: No Other Position/Activity Restrictions: WBAT    Mobility  Bed Mobility Overal bed mobility: Needs Assistance Bed Mobility: Supine to Sit     Supine to sit: Min assist     General bed mobility comments: Assist for R LE. Increased time.    Transfers Overall transfer level: Needs assistance Equipment used: Rolling walker (2 wheeled) Transfers: Sit to/from Stand Sit to Stand: Min guard         General transfer comment: Cues for safety, hand placement. Increased time. Min guard assist.  Ambulation/Gait Ambulation/Gait assistance: Min guard Gait Distance (Feet): 25 Feet Assistive device: Rolling walker (2 wheeled) Gait Pattern/deviations: Step-to pattern;Antalgic;Decreased stance time - right     General Gait Details: Min guard for safety. Cues for safety, technique. Distance limited by pain.   Stairs Stairs: Yes Stairs assistance: Min assist Stair Management: Step to pattern;Backwards;With walker Number of Stairs: 2 General stair comments: Assist to stabilize pt and walker. Cues for safety,  technique, sequence.   Wheelchair Mobility    Modified Rankin (Stroke Patients Only)       Balance Overall balance assessment: Needs assistance;History of Falls         Standing balance support: Bilateral upper extremity supported Standing balance-Leahy Scale: Poor                              Cognition Arousal/Alertness: Awake/alert Behavior During Therapy: WFL for tasks assessed/performed Overall Cognitive Status: Within Functional Limits for tasks assessed                                        Exercises General Exercises - Lower Extremity Ankle Circles/Pumps: AROM;Both;10 reps;Supine Quad Sets: AROM;Both;10 reps;Supine Heel Slides: AAROM;Right;AROM;10 reps;Supine Hip ABduction/ADduction: AROM;AAROM;Right;10 reps;Supine    General Comments        Pertinent Vitals/Pain Pain Assessment: 0-10 Pain Score: 5  Pain Location: R hip and just above R knee Pain Descriptors / Indicators: Sore;Discomfort;Grimacing Pain Intervention(s): Monitored during session;Repositioned    Home Living                      Prior Function            PT Goals (current goals can now be found in the care plan section) Progress towards PT goals: Progressing toward goals    Frequency    Min 5X/week      PT Plan Current plan remains appropriate    Co-evaluation  AM-PAC PT "6 Clicks" Mobility   Outcome Measure  Help needed turning from your back to your side while in a flat bed without using bedrails?: A Little Help needed moving from lying on your back to sitting on the side of a flat bed without using bedrails?: A Little Help needed moving to and from a bed to a chair (including a wheelchair)?: A Little Help needed standing up from a chair using your arms (e.g., wheelchair or bedside chair)?: A Little Help needed to walk in hospital room?: A Little Help needed climbing 3-5 steps with a railing? : A Little 6 Click  Score: 18    End of Session Equipment Utilized During Treatment: Gait belt Activity Tolerance: Patient tolerated treatment well Patient left: in bed;with call bell/phone within reach;with bed alarm set   PT Visit Diagnosis: Pain;Other abnormalities of gait and mobility (R26.89);History of falling (Z91.81) Pain - Right/Left: Right Pain - part of body: Hip;Knee     Time: 1028-1100 PT Time Calculation (min) (ACUTE ONLY): 32 min  Charges:  $Gait Training: 8-22 mins $Therapeutic Exercise: 8-22 mins                        [name], PT Acute Rehabilitation  Office: [phone] Pager: [phone]

## 2020-09-07 ENCOUNTER — Other Ambulatory Visit: Payer: Self-pay

## 2020-09-07 NOTE — Patient Outreach (Signed)
[facility]) Care Management  09/07/2020  Alejandra Taylor 1960/09/03 [id number]     Transition of Care Referral  Referral Date: 09/07/2020 Ref[facility] Source: [facility] Report Date of Discharge: 09/06/2020 Facility: [place]: [facility]    Referral received. Transition of care calls being completed via EMMI-automated calls.     Plan: RN CM will close case.    Enzo Montgomery, RN,BSN,CCM [place] Management Telephonic Care Management Coordinator Direct Phone: [phone] Toll Free: [phone] Fax: [phone]

## 2020-09-09 NOTE — Patient Outreach (Signed)
Patient received an Emmi Message referring them to contact their provider office.

## 2020-09-13 DIAGNOSIS — F418 Other specified anxiety disorders: Secondary | ICD-10-CM | POA: Diagnosis not present

## 2020-09-13 DIAGNOSIS — E1159 Type 2 diabetes mellitus with other circulatory complications: Secondary | ICD-10-CM | POA: Diagnosis not present

## 2020-09-13 DIAGNOSIS — I152 Hypertension secondary to endocrine disorders: Secondary | ICD-10-CM | POA: Diagnosis not present

## 2020-09-13 DIAGNOSIS — G43901 Migraine, unspecified, not intractable, with status migrainosus: Secondary | ICD-10-CM | POA: Diagnosis not present

## 2020-09-13 DIAGNOSIS — M4800 Spinal stenosis, site unspecified: Secondary | ICD-10-CM | POA: Diagnosis not present

## 2020-09-13 DIAGNOSIS — N182 Chronic kidney disease, stage 2 (mild): Secondary | ICD-10-CM | POA: Diagnosis not present

## 2020-09-13 DIAGNOSIS — E1122 Type 2 diabetes mellitus with diabetic chronic kidney disease: Secondary | ICD-10-CM | POA: Diagnosis not present

## 2020-09-13 DIAGNOSIS — G8929 Other chronic pain: Secondary | ICD-10-CM | POA: Diagnosis not present

## 2020-09-13 DIAGNOSIS — M80051D Age-related osteoporosis with current pathological fracture, right femur, subsequent encounter for fracture with routine healing: Secondary | ICD-10-CM | POA: Diagnosis not present

## 2020-09-15 DIAGNOSIS — Z9889 Other specified postprocedural states: Secondary | ICD-10-CM | POA: Diagnosis not present

## 2020-09-17 DIAGNOSIS — G43901 Migraine, unspecified, not intractable, with status migrainosus: Secondary | ICD-10-CM | POA: Diagnosis not present

## 2020-09-17 DIAGNOSIS — E1159 Type 2 diabetes mellitus with other circulatory complications: Secondary | ICD-10-CM | POA: Diagnosis not present

## 2020-09-17 DIAGNOSIS — E1122 Type 2 diabetes mellitus with diabetic chronic kidney disease: Secondary | ICD-10-CM | POA: Diagnosis not present

## 2020-09-17 DIAGNOSIS — G8929 Other chronic pain: Secondary | ICD-10-CM | POA: Diagnosis not present

## 2020-09-17 DIAGNOSIS — F418 Other specified anxiety disorders: Secondary | ICD-10-CM | POA: Diagnosis not present

## 2020-09-17 DIAGNOSIS — M4800 Spinal stenosis, site unspecified: Secondary | ICD-10-CM | POA: Diagnosis not present

## 2020-09-17 DIAGNOSIS — M80051D Age-related osteoporosis with current pathological fracture, right femur, subsequent encounter for fracture with routine healing: Secondary | ICD-10-CM | POA: Diagnosis not present

## 2020-09-17 DIAGNOSIS — N182 Chronic kidney disease, stage 2 (mild): Secondary | ICD-10-CM | POA: Diagnosis not present

## 2020-09-17 DIAGNOSIS — I152 Hypertension secondary to endocrine disorders: Secondary | ICD-10-CM | POA: Diagnosis not present

## 2020-09-19 ENCOUNTER — Telehealth: Payer: Self-pay | Admitting: Physician Assistant

## 2020-09-19 ENCOUNTER — Other Ambulatory Visit: Payer: Self-pay | Admitting: Physician Assistant

## 2020-09-19 DIAGNOSIS — G8929 Other chronic pain: Secondary | ICD-10-CM | POA: Diagnosis not present

## 2020-09-19 DIAGNOSIS — M4800 Spinal stenosis, site unspecified: Secondary | ICD-10-CM | POA: Diagnosis not present

## 2020-09-19 DIAGNOSIS — N182 Chronic kidney disease, stage 2 (mild): Secondary | ICD-10-CM | POA: Diagnosis not present

## 2020-09-19 DIAGNOSIS — E1159 Type 2 diabetes mellitus with other circulatory complications: Secondary | ICD-10-CM | POA: Diagnosis not present

## 2020-09-19 DIAGNOSIS — I152 Hypertension secondary to endocrine disorders: Secondary | ICD-10-CM | POA: Diagnosis not present

## 2020-09-19 DIAGNOSIS — E1122 Type 2 diabetes mellitus with diabetic chronic kidney disease: Secondary | ICD-10-CM | POA: Diagnosis not present

## 2020-09-19 DIAGNOSIS — G43901 Migraine, unspecified, not intractable, with status migrainosus: Secondary | ICD-10-CM | POA: Diagnosis not present

## 2020-09-19 DIAGNOSIS — E785 Hyperlipidemia, unspecified: Secondary | ICD-10-CM

## 2020-09-19 DIAGNOSIS — E1169 Type 2 diabetes mellitus with other specified complication: Secondary | ICD-10-CM

## 2020-09-19 DIAGNOSIS — F418 Other specified anxiety disorders: Secondary | ICD-10-CM | POA: Diagnosis not present

## 2020-09-19 DIAGNOSIS — M80051D Age-related osteoporosis with current pathological fracture, right femur, subsequent encounter for fracture with routine healing: Secondary | ICD-10-CM | POA: Diagnosis not present

## 2020-09-19 NOTE — Telephone Encounter (Signed)
Patient scheduled for end of April as she requested a late afternoon appt at the end of April.

## 2020-09-19 NOTE — Telephone Encounter (Signed)
Please contact patient to schedule apt per last AVS. AS, CMA

## 2020-09-20 ENCOUNTER — Other Ambulatory Visit: Payer: Self-pay | Admitting: Physician Assistant

## 2020-09-20 DIAGNOSIS — E119 Type 2 diabetes mellitus without complications: Secondary | ICD-10-CM

## 2020-09-23 DIAGNOSIS — G43901 Migraine, unspecified, not intractable, with status migrainosus: Secondary | ICD-10-CM | POA: Diagnosis not present

## 2020-09-23 DIAGNOSIS — M80051D Age-related osteoporosis with current pathological fracture, right femur, subsequent encounter for fracture with routine healing: Secondary | ICD-10-CM | POA: Diagnosis not present

## 2020-09-23 DIAGNOSIS — M4800 Spinal stenosis, site unspecified: Secondary | ICD-10-CM | POA: Diagnosis not present

## 2020-09-23 DIAGNOSIS — E1159 Type 2 diabetes mellitus with other circulatory complications: Secondary | ICD-10-CM | POA: Diagnosis not present

## 2020-09-23 DIAGNOSIS — N182 Chronic kidney disease, stage 2 (mild): Secondary | ICD-10-CM | POA: Diagnosis not present

## 2020-09-23 DIAGNOSIS — G8929 Other chronic pain: Secondary | ICD-10-CM | POA: Diagnosis not present

## 2020-09-23 DIAGNOSIS — I152 Hypertension secondary to endocrine disorders: Secondary | ICD-10-CM | POA: Diagnosis not present

## 2020-09-23 DIAGNOSIS — F418 Other specified anxiety disorders: Secondary | ICD-10-CM | POA: Diagnosis not present

## 2020-09-23 DIAGNOSIS — E1122 Type 2 diabetes mellitus with diabetic chronic kidney disease: Secondary | ICD-10-CM | POA: Diagnosis not present

## 2020-09-26 DIAGNOSIS — G43901 Migraine, unspecified, not intractable, with status migrainosus: Secondary | ICD-10-CM | POA: Diagnosis not present

## 2020-09-26 DIAGNOSIS — F418 Other specified anxiety disorders: Secondary | ICD-10-CM | POA: Diagnosis not present

## 2020-09-26 DIAGNOSIS — G8929 Other chronic pain: Secondary | ICD-10-CM | POA: Diagnosis not present

## 2020-09-26 DIAGNOSIS — M4800 Spinal stenosis, site unspecified: Secondary | ICD-10-CM | POA: Diagnosis not present

## 2020-09-26 DIAGNOSIS — M80051D Age-related osteoporosis with current pathological fracture, right femur, subsequent encounter for fracture with routine healing: Secondary | ICD-10-CM | POA: Diagnosis not present

## 2020-09-26 DIAGNOSIS — N182 Chronic kidney disease, stage 2 (mild): Secondary | ICD-10-CM | POA: Diagnosis not present

## 2020-09-26 DIAGNOSIS — E1159 Type 2 diabetes mellitus with other circulatory complications: Secondary | ICD-10-CM | POA: Diagnosis not present

## 2020-09-26 DIAGNOSIS — I152 Hypertension secondary to endocrine disorders: Secondary | ICD-10-CM | POA: Diagnosis not present

## 2020-09-26 DIAGNOSIS — E1122 Type 2 diabetes mellitus with diabetic chronic kidney disease: Secondary | ICD-10-CM | POA: Diagnosis not present

## 2020-09-29 ENCOUNTER — Other Ambulatory Visit: Payer: Self-pay | Admitting: Physician Assistant

## 2020-09-29 DIAGNOSIS — E119 Type 2 diabetes mellitus without complications: Secondary | ICD-10-CM

## 2020-09-29 DIAGNOSIS — E1169 Type 2 diabetes mellitus with other specified complication: Secondary | ICD-10-CM

## 2020-10-03 ENCOUNTER — Other Ambulatory Visit: Payer: Self-pay | Admitting: Physician Assistant

## 2020-10-03 DIAGNOSIS — K2970 Gastritis, unspecified, without bleeding: Secondary | ICD-10-CM

## 2020-10-06 ENCOUNTER — Other Ambulatory Visit: Payer: Self-pay | Admitting: Physician Assistant

## 2020-10-09 ENCOUNTER — Other Ambulatory Visit: Payer: Self-pay | Admitting: Physician Assistant

## 2020-10-12 DIAGNOSIS — R262 Difficulty in walking, not elsewhere classified: Secondary | ICD-10-CM | POA: Diagnosis not present

## 2020-10-12 DIAGNOSIS — M25651 Stiffness of right hip, not elsewhere classified: Secondary | ICD-10-CM | POA: Diagnosis not present

## 2020-10-12 DIAGNOSIS — M6281 Muscle weakness (generalized): Secondary | ICD-10-CM | POA: Diagnosis not present

## 2020-10-16 ENCOUNTER — Other Ambulatory Visit: Payer: Self-pay | Admitting: Physician Assistant

## 2020-10-16 DIAGNOSIS — E119 Type 2 diabetes mellitus without complications: Secondary | ICD-10-CM

## 2020-10-20 ENCOUNTER — Encounter: Payer: Medicare HMO | Admitting: Physician Assistant

## 2020-10-20 ENCOUNTER — Other Ambulatory Visit: Payer: Self-pay | Admitting: Physician Assistant

## 2020-10-20 DIAGNOSIS — Z9889 Other specified postprocedural states: Secondary | ICD-10-CM | POA: Diagnosis not present

## 2020-10-20 DIAGNOSIS — M25651 Stiffness of right hip, not elsewhere classified: Secondary | ICD-10-CM | POA: Diagnosis not present

## 2020-10-20 DIAGNOSIS — K2970 Gastritis, unspecified, without bleeding: Secondary | ICD-10-CM

## 2020-10-24 DIAGNOSIS — M25651 Stiffness of right hip, not elsewhere classified: Secondary | ICD-10-CM | POA: Diagnosis not present

## 2020-10-24 DIAGNOSIS — R262 Difficulty in walking, not elsewhere classified: Secondary | ICD-10-CM | POA: Diagnosis not present

## 2020-10-24 DIAGNOSIS — M6281 Muscle weakness (generalized): Secondary | ICD-10-CM | POA: Diagnosis not present

## 2020-10-31 DIAGNOSIS — M25651 Stiffness of right hip, not elsewhere classified: Secondary | ICD-10-CM | POA: Diagnosis not present

## 2020-10-31 DIAGNOSIS — R262 Difficulty in walking, not elsewhere classified: Secondary | ICD-10-CM | POA: Diagnosis not present

## 2020-10-31 DIAGNOSIS — M6281 Muscle weakness (generalized): Secondary | ICD-10-CM | POA: Diagnosis not present

## 2020-11-02 DIAGNOSIS — M6281 Muscle weakness (generalized): Secondary | ICD-10-CM | POA: Diagnosis not present

## 2020-11-02 DIAGNOSIS — M25651 Stiffness of right hip, not elsewhere classified: Secondary | ICD-10-CM | POA: Diagnosis not present

## 2020-11-02 DIAGNOSIS — R262 Difficulty in walking, not elsewhere classified: Secondary | ICD-10-CM | POA: Diagnosis not present

## 2020-11-17 ENCOUNTER — Other Ambulatory Visit: Payer: Self-pay | Admitting: Physician Assistant

## 2020-11-21 ENCOUNTER — Other Ambulatory Visit: Payer: Self-pay | Admitting: Physician Assistant

## 2020-11-21 DIAGNOSIS — K2970 Gastritis, unspecified, without bleeding: Secondary | ICD-10-CM

## 2020-11-22 DIAGNOSIS — M25651 Stiffness of right hip, not elsewhere classified: Secondary | ICD-10-CM | POA: Diagnosis not present

## 2020-11-22 DIAGNOSIS — M6281 Muscle weakness (generalized): Secondary | ICD-10-CM | POA: Diagnosis not present

## 2020-11-22 DIAGNOSIS — R262 Difficulty in walking, not elsewhere classified: Secondary | ICD-10-CM | POA: Diagnosis not present

## 2020-12-01 DIAGNOSIS — M25651 Stiffness of right hip, not elsewhere classified: Secondary | ICD-10-CM | POA: Diagnosis not present

## 2020-12-01 DIAGNOSIS — M25551 Pain in right hip: Secondary | ICD-10-CM | POA: Diagnosis not present

## 2020-12-03 ENCOUNTER — Other Ambulatory Visit: Payer: Self-pay | Admitting: Physician Assistant

## 2020-12-07 ENCOUNTER — Encounter: Payer: Medicare HMO | Admitting: Physician Assistant

## 2021-01-16 ENCOUNTER — Other Ambulatory Visit: Payer: Self-pay | Admitting: Physician Assistant

## 2021-01-16 DIAGNOSIS — K2970 Gastritis, unspecified, without bleeding: Secondary | ICD-10-CM

## 2021-01-19 ENCOUNTER — Ambulatory Visit (INDEPENDENT_AMBULATORY_CARE_PROVIDER_SITE_OTHER): Payer: Medicare HMO | Admitting: Physician Assistant

## 2021-01-19 ENCOUNTER — Other Ambulatory Visit: Payer: Self-pay

## 2021-01-19 ENCOUNTER — Encounter: Payer: Self-pay | Admitting: Physician Assistant

## 2021-01-19 VITALS — BP 137/75 | HR 83 | Temp 98.3°F | Ht 68.0 in | Wt 146.9 lb

## 2021-01-19 DIAGNOSIS — E1169 Type 2 diabetes mellitus with other specified complication: Secondary | ICD-10-CM

## 2021-01-19 DIAGNOSIS — Z0000 Encounter for general adult medical examination without abnormal findings: Secondary | ICD-10-CM

## 2021-01-19 DIAGNOSIS — Z1231 Encounter for screening mammogram for malignant neoplasm of breast: Secondary | ICD-10-CM | POA: Diagnosis not present

## 2021-01-19 DIAGNOSIS — E119 Type 2 diabetes mellitus without complications: Secondary | ICD-10-CM

## 2021-01-19 DIAGNOSIS — M545 Low back pain, unspecified: Secondary | ICD-10-CM | POA: Diagnosis not present

## 2021-01-19 DIAGNOSIS — E1159 Type 2 diabetes mellitus with other circulatory complications: Secondary | ICD-10-CM | POA: Diagnosis not present

## 2021-01-19 DIAGNOSIS — Z1211 Encounter for screening for malignant neoplasm of colon: Secondary | ICD-10-CM

## 2021-01-19 DIAGNOSIS — Z716 Tobacco abuse counseling: Secondary | ICD-10-CM

## 2021-01-19 DIAGNOSIS — M8000XA Age-related osteoporosis with current pathological fracture, unspecified site, initial encounter for fracture: Secondary | ICD-10-CM | POA: Diagnosis not present

## 2021-01-19 DIAGNOSIS — E785 Hyperlipidemia, unspecified: Secondary | ICD-10-CM

## 2021-01-19 DIAGNOSIS — I152 Hypertension secondary to endocrine disorders: Secondary | ICD-10-CM

## 2021-01-19 DIAGNOSIS — Z23 Encounter for immunization: Secondary | ICD-10-CM

## 2021-01-19 DIAGNOSIS — G8929 Other chronic pain: Secondary | ICD-10-CM

## 2021-01-19 LAB — POCT GLYCOSYLATED HEMOGLOBIN (HGB A1C): Hemoglobin A1C: 5.9 % — AB (ref 4.0–5.6)

## 2021-01-19 MED ORDER — METFORMIN HCL 1000 MG PO TABS: 500.0000 mg | ORAL_TABLET | Freq: Two times a day (BID) | ORAL | 0 refills | Status: DC

## 2021-01-19 MED ORDER — CYCLOBENZAPRINE HCL 10 MG PO TABS: 10.0000 mg | ORAL_TABLET | Freq: Three times a day (TID) | ORAL | 1 refills | Status: AC | PRN

## 2021-01-19 NOTE — Progress Notes (Signed)
Subjective:     Madasyn Gan is a 60 y.o. female and is here for a comprehensive physical exam. The patient reports no problems.  Social History   Socioeconomic History   Marital status: Married    Spouse name: Not on file   Number of children: 1   Years of education: Not on file   Highest education level: Not on file  Occupational History   Occupation: Disabled  Tobacco Use   Smoking status: Every Day    Packs/day: 0.50    Years: 30.00    Pack years: 15.00    Types: Cigarettes   Smokeless tobacco: Never  Vaping Use   Vaping Use: Some days  Substance and Sexual Activity   Alcohol use: No   Drug use: Yes    Types: Marijuana    Comment: pain relief attempt   Sexual activity: Not Currently  Other Topics Concern   Not on file  Social History Narrative   Not on file   Social Determinants of Health   Financial Resource Strain: Not on file  Food Insecurity: Not on file  Transportation Needs: Not on file  Physical Activity: Not on file  Stress: Not on file  Social Connections: Not on file  Intimate Partner Violence: Not on file   Health Maintenance  Topic Date Due   PNEUMOCOCCAL POLYSACCHARIDE VACCINE AGE 73-64 HIGH RISK  Never done   Zoster Vaccines- Shingrix (1 of 2) Never done   Pneumococcal Vaccine 79-27 Years old (2 - PPSV23 or PCV20) 11/01/2015   PAP SMEAR-Modifier  04/06/2018   TETANUS/TDAP  08/06/2018   COLONOSCOPY (Pts 45-19yr Insurance coverage will need to be confirmed)  07/29/2019   COVID-19 Vaccine (3 - Pfizer risk series) 11/05/2019   OPHTHALMOLOGY EXAM  03/17/2020   HEMOGLOBIN A1C  03/23/2020   FOOT EXAM  11/17/2020   INFLUENZA VACCINE  01/23/2021   MAMMOGRAM  01/10/2022   Hepatitis C Screening  Completed   HIV Screening  Completed   HPV VACCINES  Aged Out    The following portions of the patient's history were reviewed and updated as appropriate: allergies, current medications, past family history, past medical history, past social history,  past surgical history, and problem list.  Review of Systems Pertinent items noted in HPI and remainder of comprehensive ROS otherwise negative.   Objective:    BP 137/75   Pulse 83   Temp 98.3 F (36.8 C)   Ht '5\' 8"'$  (1.727 m)   Wt 146 lb 14.4 oz (66.6 kg)   SpO2 100%   BMI 22.34 kg/m  General appearance: alert, cooperative, and no distress Head: Normocephalic, without obvious abnormality, atraumatic Eyes: conjunctivae/corneas clear. PERRL, EOM's intact. Fundi benign. Ears: normal TM's and external ear canals both ears Nose: Nares normal. Septum midline. Mucosa normal. No drainage or sinus tenderness. Throat: normal findings: lips normal without lesions, tongue midline and normal, and upper dentures Neck: no adenopathy, no carotid bruit, no JVD, supple, symmetrical, trachea midline, and thyroid not enlarged, symmetric, no tenderness/mass/nodules Back: symmetric, no curvature. ROM normal. No CVA tenderness. Lungs: clear to auscultation bilaterally Heart: regular rate and rhythm, S1, S2 normal, no murmur, click, rub or gallop Abdomen: soft, non-tender; bowel sounds normal; no masses,  no organomegaly Pelvic: not indicated; status post hysterectomy, negative ROS Extremities: no edema, redness or tenderness in the calves or thighs and no ulcers, gangrene or trophic changes Pulses: 2+ and symmetric Skin: Skin color, texture, turgor normal. No rashes or lesions Lymph nodes: Cervical adenopathy:  normal and Supraclavicular adenopathy: normal Neurologic: Grossly normal    Assessment:    Healthy female exam.     Plan:  -We will obtain fasting labs. -A1c well controlled at 5.9, recommend decreasing metformin to 500 mg twice daily. -Will place order for screening mammogram, colonoscopy agreeable to Tdap. -Will request immunization records from [facility] (Shingrix and pneumonia-Prevnar 13) -Recommend to continue dietary changes (low carbohydrate and fat diet) and stay as active as  possible. -Recommend to continue with tobacco reduction (1/2 PPD from 1 PPD) and eventually quit. Smoking cessation instruction/counseling given:  counseled patient on the dangers of tobacco use, advised patient to stop smoking, and reviewed strategies to maximize success -Follow-up in 3 months for regular OV: DM, HTN, HDL  See After Visit Summary for Counseling Recommendations

## 2021-01-19 NOTE — Patient Instructions (Signed)
Preventive Care 60-60 Years Old, Female Preventive care refers to lifestyle choices and visits with your health care provider that can promote health and wellness. This includes: A yearly physical exam. This is also called an annual wellness visit. Regular dental and eye exams. Immunizations. Screening for certain conditions. Healthy lifestyle choices, such as: Eating a healthy diet. Getting regular exercise. Not using drugs or products that contain nicotine and tobacco. Limiting alcohol use. What can I expect for my preventive care visit? Physical exam Your health care provider will check your: Height and weight. These may be used to calculate your BMI (body mass index). BMI is a measurement that tells if you are at a healthy weight. Heart rate and blood pressure. Body temperature. Skin for abnormal spots. Counseling Your health care provider may ask you questions about your: Past medical problems. Family's medical history. Alcohol, tobacco, and drug use. Emotional well-being. Home life and relationship well-being. Sexual activity. Diet, exercise, and sleep habits. Work and work Statistician. Access to firearms. Method of birth control. Menstrual cycle. Pregnancy history. What immunizations do I need?  Vaccines are usually given at various ages, according to a schedule. Your health care provider will recommend vaccines for you based on your age, medicalhistory, and lifestyle or other factors, such as travel or where you work. What tests do I need? Blood tests Lipid and cholesterol levels. These may be checked every 5 years, or more often if you are over 60 years old. Hepatitis C test. Hepatitis B test. Screening Lung cancer screening. You may have this screening every year starting at age 30 if you have a 30-pack-year history of smoking and currently smoke or have quit within the past 15 years. Colorectal cancer screening. All adults should have this screening starting at  age 60 and continuing until age 60. Your health care provider may recommend screening at age 60 if you are at increased risk. You will have tests every 1-10 years, depending on your results and the type of screening test. Diabetes screening. This is done by checking your blood sugar (glucose) after you have not eaten for a while (fasting). You may have this done every 1-3 years. Mammogram. This may be done every 1-2 years. Talk with your health care provider about when you should start having regular mammograms. This may depend on whether you have a family history of breast cancer. BRCA-related cancer screening. This may be done if you have a family history of breast, ovarian, tubal, or peritoneal cancers. Pelvic exam and Pap test. This may be done every 3 years starting at age 60. Starting at age 54, this may be done every 5 years if you have a Pap test in combination with an HPV test. Other tests STD (sexually transmitted disease) testing, if you are at risk. Bone density scan. This is done to screen for osteoporosis. You may have this scan if you are at high risk for osteoporosis. Talk with your health care provider about your test results, treatment options,and if necessary, the need for more tests. Follow these instructions at home: Eating and drinking  Eat a diet that includes fresh fruits and vegetables, whole grains, lean protein, and low-fat dairy products. Take vitamin and mineral supplements as recommended by your health care provider. Do not drink alcohol if: Your health care provider tells you not to drink. You are pregnant, may be pregnant, or are planning to become pregnant. If you drink alcohol: Limit how much you have to 0-1 drink a day. Be aware  of how much alcohol is in your drink. In the [place] one drink equals one 12 oz bottle of beer (355 mL), one 5 oz glass of wine (148 mL), or one 1 oz glass of hard liquor (44 mL).  Lifestyle Take daily care of your teeth and  gums. Brush your teeth every morning and night with fluoride toothpaste. Floss one time each day. Stay active. Exercise for at least 30 minutes 5 or more days each week. Do not use any products that contain nicotine or tobacco, such as cigarettes, e-cigarettes, and chewing tobacco. If you need help quitting, ask your health care provider. Do not use drugs. If you are sexually active, practice safe sex. Use a condom or other form of protection to prevent STIs (sexually transmitted infections). If you do not wish to become pregnant, use a form of birth control. If you plan to become pregnant, see your health care provider for a prepregnancy visit. If told by your health care provider, take low-dose aspirin daily starting at age 29. Find healthy ways to cope with stress, such as: Meditation, yoga, or listening to music. Journaling. Talking to a trusted person. Spending time with friends and family. Safety Always wear your seat belt while driving or riding in a vehicle. Do not drive: If you have been drinking alcohol. Do not ride with someone who has been drinking. When you are tired or distracted. While texting. Wear a helmet and other protective equipment during sports activities. If you have firearms in your house, make sure you follow all gun safety procedures. What's next? Visit your health care provider once a year for an annual wellness visit. Ask your health care provider how often you should have your eyes and teeth checked. Stay up to date on all vaccines. This information is not intended to replace advice given to you by your health care provider. Make sure you discuss any questions you have with your healthcare provider. Document Revised: 03/15/2020 Document Reviewed: 02/20/2018 Elsevier Patient Education  2022 [employer].

## 2021-01-20 LAB — LIPID PANEL
Chol/HDL Ratio: 2.8 ratio (ref 0.0–4.4)
Cholesterol, Total: 204 mg/dL — ABNORMAL HIGH (ref 100–199)
HDL: 73 mg/dL (ref >39)
LDL Chol Calc (NIH): 115 mg/dL — ABNORMAL HIGH (ref 0–99)
Triglycerides: 93 mg/dL (ref 0–149)
VLDL Cholesterol Cal: 16 mg/dL (ref 5–40)

## 2021-01-20 LAB — COMPREHENSIVE METABOLIC PANEL
ALT: 10 IU/L (ref 0–32)
AST: 20 IU/L (ref 0–40)
Albumin/Globulin Ratio: 1.8 (ref 1.2–2.2)
Albumin: 4.6 g/dL (ref 3.8–4.9)
Alkaline Phosphatase: 115 IU/L (ref 44–121)
BUN/Creatinine Ratio: 21 (ref 12–28)
BUN: 16 mg/dL (ref 8–27)
Bilirubin Total: 0.3 mg/dL (ref 0.0–1.2)
CO2: 23 mmol/L (ref 20–29)
Calcium: 9.7 mg/dL (ref 8.7–10.3)
Chloride: 100 mmol/L (ref 96–106)
Creatinine, Ser: 0.76 mg/dL (ref 0.57–1.00)
Globulin, Total: 2.5 g/dL (ref 1.5–4.5)
Glucose: 103 mg/dL — ABNORMAL HIGH (ref 65–99)
Potassium: 3.8 mmol/L (ref 3.5–5.2)
Sodium: 138 mmol/L (ref 134–144)
Total Protein: 7.1 g/dL (ref 6.0–8.5)
eGFR: 90 mL/min/{1.73_m2} (ref >59)

## 2021-01-20 LAB — CBC WITH DIFFERENTIAL/PLATELET

## 2021-01-20 LAB — TSH: TSH: 4.01 u[IU]/mL (ref 0.450–4.500)

## 2021-01-20 LAB — VITAMIN D 25 HYDROXY (VIT D DEFICIENCY, FRACTURES): Vit D, 25-Hydroxy: 54.8 ng/mL (ref 30.0–100.0)

## 2021-01-25 ENCOUNTER — Other Ambulatory Visit: Payer: Self-pay | Admitting: Physician Assistant

## 2021-01-25 DIAGNOSIS — K2970 Gastritis, unspecified, without bleeding: Secondary | ICD-10-CM

## 2021-01-26 ENCOUNTER — Telehealth: Payer: Self-pay | Admitting: Physician Assistant

## 2021-01-26 NOTE — Telephone Encounter (Signed)
Pharmacy noticed patient has diabetes and called recommending Statin because of the patient has diabetes. They will be faxing over a form. They are just recommending it for the patient and would like it to be reviewed as the patient is not taking a medication for diabetes, thanks.

## 2021-02-02 ENCOUNTER — Telehealth: Payer: Self-pay | Admitting: Physician Assistant

## 2021-02-02 NOTE — Telephone Encounter (Signed)
Patient is aware of the lab results and verbalized understanding. AS, CMA

## 2021-04-06 DIAGNOSIS — M71351 Other bursal cyst, right hip: Secondary | ICD-10-CM | POA: Diagnosis not present

## 2021-04-06 DIAGNOSIS — M25551 Pain in right hip: Secondary | ICD-10-CM | POA: Diagnosis not present

## 2021-05-06 ENCOUNTER — Other Ambulatory Visit: Payer: Self-pay | Admitting: Physician Assistant

## 2021-05-06 DIAGNOSIS — K2970 Gastritis, unspecified, without bleeding: Secondary | ICD-10-CM

## 2021-05-08 ENCOUNTER — Ambulatory Visit: Payer: Medicare HMO | Admitting: Physician Assistant

## 2021-05-12 DIAGNOSIS — E113291 Type 2 diabetes mellitus with mild nonproliferative diabetic retinopathy without macular edema, right eye: Secondary | ICD-10-CM | POA: Diagnosis not present

## 2021-05-12 DIAGNOSIS — Z0100 Encounter for examination of eyes and vision without abnormal findings: Secondary | ICD-10-CM | POA: Diagnosis not present

## 2021-05-15 DIAGNOSIS — F4321 Adjustment disorder with depressed mood: Secondary | ICD-10-CM | POA: Diagnosis not present

## 2021-05-15 DIAGNOSIS — F4001 Agoraphobia with panic disorder: Secondary | ICD-10-CM | POA: Diagnosis not present

## 2021-05-15 DIAGNOSIS — F4310 Post-traumatic stress disorder, unspecified: Secondary | ICD-10-CM | POA: Diagnosis not present

## 2021-06-07 ENCOUNTER — Other Ambulatory Visit: Payer: Self-pay | Admitting: Physician Assistant

## 2021-06-07 DIAGNOSIS — K2970 Gastritis, unspecified, without bleeding: Secondary | ICD-10-CM

## 2021-06-21 ENCOUNTER — Other Ambulatory Visit: Payer: Self-pay | Admitting: Physician Assistant

## 2021-06-21 DIAGNOSIS — K2970 Gastritis, unspecified, without bleeding: Secondary | ICD-10-CM

## 2021-07-29 ENCOUNTER — Other Ambulatory Visit: Payer: Self-pay | Admitting: Physician Assistant

## 2021-07-29 DIAGNOSIS — K2970 Gastritis, unspecified, without bleeding: Secondary | ICD-10-CM

## 2021-08-09 ENCOUNTER — Other Ambulatory Visit: Payer: Self-pay

## 2021-08-09 DIAGNOSIS — E119 Type 2 diabetes mellitus without complications: Secondary | ICD-10-CM

## 2021-08-09 DIAGNOSIS — E1169 Type 2 diabetes mellitus with other specified complication: Secondary | ICD-10-CM

## 2021-08-09 DIAGNOSIS — I152 Hypertension secondary to endocrine disorders: Secondary | ICD-10-CM

## 2021-08-09 DIAGNOSIS — E039 Hypothyroidism, unspecified: Secondary | ICD-10-CM

## 2021-08-09 DIAGNOSIS — E785 Hyperlipidemia, unspecified: Secondary | ICD-10-CM

## 2021-08-09 DIAGNOSIS — E1159 Type 2 diabetes mellitus with other circulatory complications: Secondary | ICD-10-CM

## 2021-08-09 MED ORDER — LEVOTHYROXINE SODIUM 125 MCG PO TABS: 125.0000 ug | ORAL_TABLET | Freq: Every day | ORAL | 0 refills | Status: DC

## 2021-08-09 MED ORDER — LISINOPRIL 20 MG PO TABS: 20.0000 mg | ORAL_TABLET | Freq: Two times a day (BID) | ORAL | 0 refills | Status: DC

## 2021-08-09 MED ORDER — METFORMIN HCL 500 MG PO TABS: 500.0000 mg | ORAL_TABLET | Freq: Two times a day (BID) | ORAL | 0 refills | Status: DC

## 2021-08-14 ENCOUNTER — Other Ambulatory Visit: Payer: Self-pay

## 2021-08-14 DIAGNOSIS — K2970 Gastritis, unspecified, without bleeding: Secondary | ICD-10-CM

## 2021-08-14 MED ORDER — PANTOPRAZOLE SODIUM 20 MG PO TBEC: 20.0000 mg | DELAYED_RELEASE_TABLET | Freq: Every day | ORAL | 0 refills | Status: DC

## 2021-08-15 ENCOUNTER — Ambulatory Visit: Payer: Medicare HMO | Admitting: Physician Assistant

## 2021-08-15 NOTE — Progress Notes (Deleted)
Established patient visit   Patient: Alejandra Taylor   DOB: 1960-09-28   61 y.o. Female  MRN: [id number] Visit Date: 08/15/2021  No chief complaint on file.  Subjective    HPI  *** Diabetes: Pt denies increased urination or thirst. Pt reports medication compliance. No hypoglycemic events. Checking glucose at home. FBS range from***  HTN: Pt denies chest pain, palpitations, dizziness or leg swelling. Taking medication as directed without side effects. Checks BP at home ***times/wk and readings range in ***. Pt follows a low salt diet.  HLD: Pt taking medication as directed without issues. Denies side effects including myalgias and RUQ pain.    Medications: Outpatient Medications Prior to Visit  Medication Sig   albuterol (PROVENTIL HFA;VENTOLIN HFA) 108 (90 Base) MCG/ACT inhaler Inhale 2 puffs into the lungs every 6 (six) hours as needed for wheezing or shortness of breath.   clonazePAM (KLONOPIN) 1 MG tablet Take 1 mg by mouth 3 (three) times daily as needed for anxiety.   cyclobenzaprine (FLEXERIL) 10 MG tablet Take 1 tablet (10 mg total) by mouth 3 (three) times daily as needed for muscle spasms.   ezetimibe (ZETIA) 10 MG tablet TAKE 1 TABLET (10 MG TOTAL) BY MOUTH DAILY. **NEEDS APT FOR REFILLS**   glucose blood test strip 1 each by Other route as needed for other. Use as instructed   levothyroxine (SYNTHROID) 125 MCG tablet Take 1 tablet (125 mcg total) by mouth daily.   LINZESS 72 MCG capsule TAKE 1 CAPSULE (72 MCG TOTAL) BY MOUTH DAILY BEFORE BREAKFAST.   lisinopril (ZESTRIL) 20 MG tablet Take 1 tablet (20 mg total) by mouth in the morning and at bedtime.   metFORMIN (GLUCOPHAGE) 500 MG tablet Take 1 tablet (500 mg total) by mouth 2 (two) times daily with a meal.   NARCAN 4 MG/0.1ML LIQD nasal spray kit Place 1 spray into the nose once. PRN Overdose   pantoprazole (PROTONIX) 20 MG tablet Take 1 tablet (20 mg total) by mouth daily.   QUEtiapine (SEROQUEL) 300 MG tablet Take 300  mg by mouth at bedtime.   simvastatin (ZOCOR) 40 MG tablet Take 1 tablet (40 mg total) by mouth daily at 6 PM. **NEEDS APT FOR REFILLS**   topiramate (TOPAMAX) 100 MG tablet Take 1 tablet (100 mg total) by mouth at bedtime. Max dose is 122m at bedtime   venlafaxine XR (EFFEXOR-XR) 150 MG 24 hr capsule Take 2 capsules (300 mg total) by mouth daily with breakfast.   Facility-Administered Medications Prior to Visit  Medication Dose Route Frequency Provider   ipratropium-albuterol (DUONEB) 0.5-2.5 (3) MG/3ML nebulizer solution 3 mL  3 mL Nebulization Q6H Danford, Katy D, NP    Review of Systems  {Labs   Heme   Chem   Endocrine   Serology   Results Review (optional):[id number]}   Objective    There were no vitals taken for this visit. BP Readings from Last 3 Encounters:  01/19/21 137/75  09/06/20 139/73  03/08/20 128/75   Wt Readings from Last 3 Encounters:  01/19/21 146 lb 14.4 oz (66.6 kg)  09/02/20 140 lb (63.5 kg)  03/08/20 165 lb 3.2 oz (74.9 kg)    Physical Exam  ***  No results found for any visits on 08/15/21.  Assessment & Plan     *** Problem List Items Addressed This Visit   None   No follow-ups on file.        MLorrene Reid PA-C  CNew[place]at [facility]ellington [phone](  phone) [phone] (fax)  [place]

## 2021-09-01 ENCOUNTER — Other Ambulatory Visit: Payer: Self-pay | Admitting: Physician Assistant

## 2021-09-01 DIAGNOSIS — E119 Type 2 diabetes mellitus without complications: Secondary | ICD-10-CM

## 2021-09-01 DIAGNOSIS — E1159 Type 2 diabetes mellitus with other circulatory complications: Secondary | ICD-10-CM

## 2021-09-01 DIAGNOSIS — I152 Hypertension secondary to endocrine disorders: Secondary | ICD-10-CM

## 2021-09-01 DIAGNOSIS — E039 Hypothyroidism, unspecified: Secondary | ICD-10-CM

## 2021-09-06 ENCOUNTER — Other Ambulatory Visit: Payer: Self-pay | Admitting: Physician Assistant

## 2021-09-06 DIAGNOSIS — K2970 Gastritis, unspecified, without bleeding: Secondary | ICD-10-CM

## 2021-09-08 ENCOUNTER — Other Ambulatory Visit: Payer: Self-pay | Admitting: Physician Assistant

## 2021-09-08 DIAGNOSIS — K2970 Gastritis, unspecified, without bleeding: Secondary | ICD-10-CM

## 2021-09-21 ENCOUNTER — Other Ambulatory Visit: Payer: Self-pay | Admitting: Physician Assistant

## 2021-09-21 DIAGNOSIS — I152 Hypertension secondary to endocrine disorders: Secondary | ICD-10-CM

## 2021-09-21 DIAGNOSIS — E119 Type 2 diabetes mellitus without complications: Secondary | ICD-10-CM

## 2021-10-05 ENCOUNTER — Other Ambulatory Visit: Payer: Self-pay | Admitting: Physician Assistant

## 2021-10-05 DIAGNOSIS — K2970 Gastritis, unspecified, without bleeding: Secondary | ICD-10-CM

## 2021-10-06 ENCOUNTER — Other Ambulatory Visit: Payer: Self-pay | Admitting: Physician Assistant

## 2021-10-06 DIAGNOSIS — E039 Hypothyroidism, unspecified: Secondary | ICD-10-CM

## 2021-10-13 ENCOUNTER — Other Ambulatory Visit: Payer: Self-pay | Admitting: Physician Assistant

## 2021-10-13 DIAGNOSIS — E119 Type 2 diabetes mellitus without complications: Secondary | ICD-10-CM

## 2021-10-13 DIAGNOSIS — I152 Hypertension secondary to endocrine disorders: Secondary | ICD-10-CM

## 2021-10-29 ENCOUNTER — Other Ambulatory Visit: Payer: Self-pay | Admitting: Physician Assistant

## 2021-10-29 DIAGNOSIS — E1159 Type 2 diabetes mellitus with other circulatory complications: Secondary | ICD-10-CM

## 2021-10-29 DIAGNOSIS — E119 Type 2 diabetes mellitus without complications: Secondary | ICD-10-CM

## 2021-11-09 ENCOUNTER — Other Ambulatory Visit: Payer: Self-pay | Admitting: Physician Assistant

## 2021-11-09 DIAGNOSIS — F3342 Major depressive disorder, recurrent, in full remission: Secondary | ICD-10-CM | POA: Diagnosis not present

## 2021-11-09 DIAGNOSIS — F4001 Agoraphobia with panic disorder: Secondary | ICD-10-CM | POA: Diagnosis not present

## 2021-11-09 DIAGNOSIS — E119 Type 2 diabetes mellitus without complications: Secondary | ICD-10-CM

## 2021-11-09 DIAGNOSIS — F4310 Post-traumatic stress disorder, unspecified: Secondary | ICD-10-CM | POA: Diagnosis not present

## 2021-11-09 DIAGNOSIS — I152 Hypertension secondary to endocrine disorders: Secondary | ICD-10-CM

## 2021-11-22 ENCOUNTER — Ambulatory Visit (INDEPENDENT_AMBULATORY_CARE_PROVIDER_SITE_OTHER): Payer: Medicare PPO | Admitting: Physician Assistant

## 2021-11-22 ENCOUNTER — Encounter: Payer: Self-pay | Admitting: Physician Assistant

## 2021-11-22 VITALS — BP 129/77 | HR 75 | Temp 97.7°F | Ht 68.0 in | Wt 149.0 lb

## 2021-11-22 DIAGNOSIS — E119 Type 2 diabetes mellitus without complications: Secondary | ICD-10-CM

## 2021-11-22 DIAGNOSIS — E1169 Type 2 diabetes mellitus with other specified complication: Secondary | ICD-10-CM

## 2021-11-22 DIAGNOSIS — E785 Hyperlipidemia, unspecified: Secondary | ICD-10-CM | POA: Diagnosis not present

## 2021-11-22 DIAGNOSIS — K5903 Drug induced constipation: Secondary | ICD-10-CM

## 2021-11-22 DIAGNOSIS — E039 Hypothyroidism, unspecified: Secondary | ICD-10-CM | POA: Diagnosis not present

## 2021-11-22 DIAGNOSIS — E1159 Type 2 diabetes mellitus with other circulatory complications: Secondary | ICD-10-CM

## 2021-11-22 DIAGNOSIS — I152 Hypertension secondary to endocrine disorders: Secondary | ICD-10-CM

## 2021-11-22 LAB — POCT GLYCOSYLATED HEMOGLOBIN (HGB A1C): Hemoglobin A1C: 5.7 % — AB (ref 4.0–5.6)

## 2021-11-22 MED ORDER — EZETIMIBE 10 MG PO TABS: 10.0000 mg | ORAL_TABLET | Freq: Every day | ORAL | 0 refills | Status: DC

## 2021-11-22 MED ORDER — LEVOTHYROXINE SODIUM 125 MCG PO TABS: 125.0000 ug | ORAL_TABLET | Freq: Every day | ORAL | 0 refills | Status: DC

## 2021-11-22 MED ORDER — LISINOPRIL 20 MG PO TABS: 20.0000 mg | ORAL_TABLET | Freq: Two times a day (BID) | ORAL | 1 refills | Status: DC

## 2021-11-22 MED ORDER — LINACLOTIDE 72 MCG PO CAPS: ORAL_CAPSULE | ORAL | 0 refills | Status: AC

## 2021-11-22 MED ORDER — METFORMIN HCL 500 MG PO TABS: 500.0000 mg | ORAL_TABLET | Freq: Two times a day (BID) | ORAL | 0 refills | Status: DC

## 2021-11-22 MED ORDER — SIMVASTATIN 40 MG PO TABS: 40.0000 mg | ORAL_TABLET | Freq: Every day | ORAL | 0 refills | Status: DC

## 2021-11-22 NOTE — Assessment & Plan Note (Signed)
-  Last lipid panel: LDL 115 (goal <70). Pt is fasting so will repeat lipid panel and hepatic function. Continue simvastatin 40 mg and zetia 10 mg. Pending lab results will make medication adjustments if LDL remains above goal. Recommend to follow a low fat diet. Will continue to monitor.

## 2021-11-22 NOTE — Assessment & Plan Note (Signed)
-  Stable. Continue current medication regimen. Provided medication refill. Will continue to monitor. 

## 2021-11-22 NOTE — Patient Instructions (Signed)
Managing Loss, Adult People experience loss in many different ways throughout their lives. Events such as moving, changing jobs, and losing friends can create a sense of loss. The loss may be as serious as a major health change, divorce, death of a pet, or death of a loved one. All of these types of loss are likely to create a physical and emotional reaction known as grief. Grief is the result of a major change or an absence of something or someone that you count on. Grief is a normal reaction to loss. A variety of factors can affect your grieving experience, including: The nature of your loss. Your relationship to what or whom you lost. Your understanding of grief and how to manage it. Your support system. Be aware that when grief becomes extreme, it can lead to more severe issues like isolation, depression, anxiety, or suicidal thoughts. Talk with your health care provider if you have any of these issues. How to manage lifestyle changes Keep to your normal routine as much as possible. If you have trouble focusing or doing normal activities, it is acceptable to take some time away from your normal routine. Spend time with friends and loved ones. Eat a healthy diet, get plenty of sleep, and rest when you feel tired. How to recognize changes  The way that you deal with your grief will affect your ability to function as you normally do. When grieving, you may experience these changes: Numbness, shock, sadness, anxiety, anger, denial, and guilt. Thoughts about death. Unexpected crying. A physical sensation of emptiness in your stomach. Problems sleeping and eating. Tiredness (fatigue). Loss of interest in normal activities. Dreaming about or imagining seeing the person who died. A need to remember what or whom you lost. Difficulty thinking about anything other than your loss for a period of time. Relief. If you have been expecting the loss for a while, you may feel a sense of relief when it  happens. Follow these instructions at home: Activity Express your feelings in healthy ways, such as: Talking with others about your loss. It may be helpful to find others who have had a similar loss, such as a support group. Writing down your feelings in a journal. Doing physical activities to release stress and emotional energy. Doing creative activities like painting, sculpting, or playing or listening to music. Practicing resilience. This is the ability to recover and adjust after facing challenges. Reading some resources that encourage resilience may help you to learn ways to practice those behaviors.  General instructions Be patient with yourself and others. Allow the grieving process to happen, and remember that grieving takes time. It is likely that you may never feel completely done with some grief. You may find a way to move on while still cherishing memories and feelings about your loss. Accepting your loss is a process. It can take months or longer to adjust. Keep all follow-up visits. This is important. Where to find support To get support for managing loss: Ask your health care provider for help and recommendations, such as grief counseling or therapy. Think about joining a support group for people who are managing a loss. Where to find more information You can find more information about managing loss from: American Society of Clinical Oncology: www.cancer.net American Psychological Association: www.apa.org Contact a health care provider if: Your grief is extreme and keeps getting worse. You have ongoing grief that does not improve. Your body shows symptoms of grief, such as illness. You feel depressed, anxious, or   hopeless. Get help right away if: You have thoughts about hurting yourself or others. Get help right away if you feel like you may hurt yourself or others, or have thoughts about taking your own life. Go to your nearest emergency room or: Call 911. Call the  National Suicide Prevention Lifeline at 1-800-273-8255 or 988. This is open 24 hours a day. Text the Crisis Text Line at 741741. Summary Grief is the result of a major change or an absence of someone or something that you count on. Grief is a normal reaction to loss. The depth of grief and the period of recovery depend on the type of loss and your ability to adjust to the change and process your feelings. Processing grief requires patience and a willingness to accept your feelings and talk about your loss with people who are supportive. It is important to find resources that work for you and to realize that people experience grief differently. There is not one grieving process that works for everyone in the same way. Be aware that when grief becomes extreme, it can lead to more severe issues like isolation, depression, anxiety, or suicidal thoughts. Talk with your health care provider if you have any of these issues. This information is not intended to replace advice given to you by your health care provider. Make sure you discuss any questions you have with your health care provider. Document Revised: 01/30/2021 Document Reviewed: 01/30/2021 Elsevier Patient Education  2023 Elsevier Inc.  

## 2021-11-22 NOTE — Progress Notes (Signed)
Established patient visit   Patient: Alejandra Taylor   DOB: [birth year]   61 y.o. Female  MRN: [id number] Visit Date: 11/22/2021  Chief Complaint  Patient presents with   Follow-up   Subjective    HPI  Patient presents for chronic follow-up. Unfortunately, her husband passed away last fall in 04-04-2023 unexpectedly which has been challenging for her. Continues to see her psychiatrist.   Diabetes: Pt denies increased urination or thirst. Pt reports medication compliance. No hypoglycemic events. Not checking glucose at home. Does report having sugar cravings.   HTN: Pt denies chest pain, palpitations, dizziness or lower extremity swelling. Taking medication as directed without side effects but needs refill.   HLD: Pt taking medication as directed without issues. Reports has small meals and some days has little appetite.    Medications: Outpatient Medications Prior to Visit  Medication Sig   albuterol (PROVENTIL HFA;VENTOLIN HFA) 108 (90 Base) MCG/ACT inhaler Inhale 2 puffs into the lungs every 6 (six) hours as needed for wheezing or shortness of breath.   clonazePAM (KLONOPIN) 1 MG tablet Take 1 mg by mouth 3 (three) times daily as needed for anxiety.   cyclobenzaprine (FLEXERIL) 10 MG tablet Take 1 tablet (10 mg total) by mouth 3 (three) times daily as needed for muscle spasms.   glucose blood test strip 1 each by Other route as needed for other. Use as instructed   NARCAN 4 MG/0.1ML LIQD nasal spray kit Place 1 spray into the nose once. PRN Overdose   pantoprazole (PROTONIX) 20 MG tablet TAKE 1 TABLET EVERY DAY   QUEtiapine (SEROQUEL) 300 MG tablet Take 300 mg by mouth at bedtime.   topiramate (TOPAMAX) 100 MG tablet Take 1 tablet (100 mg total) by mouth at bedtime. Max dose is 144m at bedtime   venlafaxine XR (EFFEXOR-XR) 150 MG 24 hr capsule Take 2 capsules (300 mg total) by mouth daily with breakfast.   [DISCONTINUED] ezetimibe (ZETIA) 10 MG tablet TAKE 1 TABLET (10 MG TOTAL) BY  MOUTH DAILY. **NEEDS APT FOR REFILLS**   [DISCONTINUED] levothyroxine (SYNTHROID) 125 MCG tablet TAKE 1 TABLET BY MOUTH EVERY DAY   [DISCONTINUED] LINZESS 72 MCG capsule TAKE 1 CAPSULE (72 MCG TOTAL) BY MOUTH DAILY BEFORE BREAKFAST.   [DISCONTINUED] lisinopril (ZESTRIL) 20 MG tablet TAKE 1 TABLET (20 MG TOTAL) BY MOUTH IN THE MORNING AND AT BEDTIME   [DISCONTINUED] metFORMIN (GLUCOPHAGE) 500 MG tablet TAKE 1 TABLET BY MOUTH TWICE A DAY WITH MEALS   [DISCONTINUED] simvastatin (ZOCOR) 40 MG tablet Take 1 tablet (40 mg total) by mouth daily at 6 PM. **NEEDS APT FOR REFILLS**   Facility-Administered Medications Prior to Visit  Medication Dose Route Frequency Provider   ipratropium-albuterol (DUONEB) 0.5-2.5 (3) MG/3ML nebulizer solution 3 mL  3 mL Nebulization Q6H Danford, Katy D, NP    Review of Systems Review of Systems:  A fourteen system review of systems was performed and found to be positive as per HPI.   Last CBC Lab Results  Component Value Date   WBC CANCELED 01/19/2021   HGB CANCELED 01/19/2021   HCT CANCELED 01/19/2021   MCV 93.4 09/06/2020   MCH 32.5 09/06/2020   RDW 12.5 09/06/2020   PLT CANCELED 011/08/1592  Last metabolic panel Lab Results  Component Value Date   GLUCOSE 103 (H) 01/19/2021   NA 138 01/19/2021   K 3.8 01/19/2021   CL 100 01/19/2021   CO2 23 01/19/2021   BUN 16 01/19/2021   CREATININE 0.76 01/19/2021   EGFR  90 01/19/2021   CALCIUM 9.7 01/19/2021   PROT 7.1 01/19/2021   ALBUMIN 4.6 01/19/2021   LABGLOB 2.5 01/19/2021   AGRATIO 1.8 01/19/2021   BILITOT 0.3 01/19/2021   ALKPHOS 115 01/19/2021   AST 20 01/19/2021   ALT 10 01/19/2021   ANIONGAP 7 09/06/2020   Last lipids Lab Results  Component Value Date   CHOL 204 (H) 01/19/2021   HDL 73 01/19/2021   LDLCALC 115 (H) 01/19/2021   TRIG 93 01/19/2021   CHOLHDL 2.8 01/19/2021   Last hemoglobin A1c Lab Results  Component Value Date   HGBA1C 5.7 (A) 11/22/2021   Last thyroid  functions Lab Results  Component Value Date   TSH 4.010 01/19/2021   T3TOTAL 59 (L) 03/04/2019   Last vitamin D Lab Results  Component Value Date   VD25OH 54.8 01/19/2021     Objective    BP 129/77   Pulse 75   Temp 97.7 F (36.5 C)   Ht _0  (1.727 m)   Wt 149 lb (67.6 kg)   SpO2 100%   BMI 22.66 kg/m  BP Readings from Last 3 Encounters:  11/22/21 129/77  01/19/21 137/75  09/06/20 139/73   Wt Readings from Last 3 Encounters:  11/22/21 149 lb (67.6 kg)  01/19/21 146 lb 14.4 oz (66.6 kg)  09/02/20 140 lb (63.5 kg)    Physical Exam  General:  Cooperative, in no acute distress, appropriate for stated age.  Neuro:  Alert and oriented,  extra-ocular muscles intact  HEENT:  Normocephalic, atraumatic, neck supple  Skin:  no gross rash, warm, pink. Cardiac:  RRR, S1 S2 Respiratory: CTA B/L  Vascular:  Ext warm, no cyanosis apprec.; cap RF less 2 sec. Psych:  No HI/SI, judgement and insight good, Euthymic mood. Full Affect.   Results for orders placed or performed in visit on 11/22/21  POCT glycosylated hemoglobin (Hb A1C)  Result Value Ref Range   Hemoglobin A1C 5.7 (A) 4.0 - 5.6 %   HbA1c POC (<> result, manual entry)     HbA1c, POC (prediabetic range)     HbA1c, POC (controlled diabetic range)      Assessment & Plan      Problem List Items Addressed This Visit       Cardiovascular and Mediastinum   Hypertension associated with diabetes ([place]) - Primary (Chronic)    -Stable. Continue current medication regimen. Provided medication refill. Will continue to monitor.       Relevant Medications   ezetimibe (ZETIA) 10 MG tablet   simvastatin (ZOCOR) 40 MG tablet   lisinopril (ZESTRIL) 20 MG tablet   Other Relevant Orders   CBC w/Diff   Comp Met (CMET)     Digestive   Constipation due to pain medication therapy   Relevant Medications   linaclotide (LINZESS) 72 MCG capsule     Endocrine   Hyperlipidemia associated with type 2 diabetes mellitus (HCC)  (Chronic)    -Last lipid panel: LDL 115 (goal <70). Pt is fasting so will repeat lipid panel and hepatic function. Continue simvastatin 40 mg and zetia 10 mg. Pending lab results will make medication adjustments if LDL remains above goal. Recommend to follow a low fat diet. Will continue to monitor.       Relevant Medications   ezetimibe (ZETIA) 10 MG tablet   simvastatin (ZOCOR) 40 MG tablet   lisinopril (ZESTRIL) 20 MG tablet   Other Relevant Orders   Lipid Profile   Hypothyroidism    -Last  TSH wnl. -Continue current medication regimen. -Rechecking thyroid labs today. Pending results will make medication adjustments if indicated.       Relevant Medications   levothyroxine (SYNTHROID) 125 MCG tablet   Other Relevant Orders   TSH   T4, free   Diabetes mellitus without complication (HCC)    -E0I remains well controlled at 5.7, will discontinue Metformin and manage with diet and lifestyle interventions. Will reassess A1c at f/up visit and if it increases >6.0 then recommend resuming Metformin at a lower dose. Follow a diabetic diet. Recommend resuming ambulatory glucose monitoring. Will continue to monitor.       Relevant Medications   simvastatin (ZOCOR) 40 MG tablet   lisinopril (ZESTRIL) 20 MG tablet   Other Relevant Orders   POCT glycosylated hemoglobin (Hb A1C) (Completed)   CBC w/Diff   Comp Met (CMET)    Return in about 4 months (around 03/24/2022) for DM, HTN, HLD, thyroid.        Lorrene Reid, PA-C  [facility] [phone] (phone) [phone] (fax)  [place]

## 2021-11-22 NOTE — Assessment & Plan Note (Signed)
-  Last TSH wnl -Continue current medication regimen. -Rechecking thyroid labs today. Pending results will make medication adjustments if indicated.  

## 2021-11-22 NOTE — Assessment & Plan Note (Signed)
-  A1c remains well controlled at 5.7, will discontinue Metformin and manage with diet and lifestyle interventions. Will reassess A1c at f/up visit and if it increases >6.0 then recommend resuming Metformin at a lower dose. Follow a diabetic diet. Recommend resuming ambulatory glucose monitoring. Will continue to monitor.

## 2021-11-23 LAB — COMPREHENSIVE METABOLIC PANEL
ALT: 5 IU/L (ref 0–32)
AST: 12 IU/L (ref 0–40)
Albumin/Globulin Ratio: 1.5 (ref 1.2–2.2)
Albumin: 3.9 g/dL (ref 3.8–4.8)
Alkaline Phosphatase: 98 IU/L (ref 44–121)
BUN/Creatinine Ratio: 10 — ABNORMAL LOW (ref 12–28)
BUN: 9 mg/dL (ref 8–27)
Bilirubin Total: <0.2 mg/dL (ref 0.0–1.2)
CO2: 26 mmol/L (ref 20–29)
Calcium: 9 mg/dL (ref 8.7–10.3)
Chloride: 102 mmol/L (ref 96–106)
Creatinine, Ser: 0.89 mg/dL (ref 0.57–1.00)
Globulin, Total: 2.6 g/dL (ref 1.5–4.5)
Glucose: 126 mg/dL — ABNORMAL HIGH (ref 70–99)
Potassium: 4.5 mmol/L (ref 3.5–5.2)
Sodium: 140 mmol/L (ref 134–144)
Total Protein: 6.5 g/dL (ref 6.0–8.5)
eGFR: 74 mL/min/{1.73_m2} (ref >59)

## 2021-11-23 LAB — LIPID PANEL
Chol/HDL Ratio: 4.7 ratio — ABNORMAL HIGH (ref 0.0–4.4)
Cholesterol, Total: 297 mg/dL — ABNORMAL HIGH (ref 100–199)
HDL: 63 mg/dL (ref >39)
LDL Chol Calc (NIH): 219 mg/dL — ABNORMAL HIGH (ref 0–99)
Triglycerides: 90 mg/dL (ref 0–149)
VLDL Cholesterol Cal: 15 mg/dL (ref 5–40)

## 2021-11-23 LAB — CBC WITH DIFFERENTIAL/PLATELET
Basophils Absolute: 0.1 10*3/uL (ref 0.0–0.2)
Basos: 2 %
EOS (ABSOLUTE): 0.2 10*3/uL (ref 0.0–0.4)
Eos: 8 %
Hematocrit: 40.7 % (ref 34.0–46.6)
Hemoglobin: 13.9 g/dL (ref 11.1–15.9)
Immature Grans (Abs): 0 10*3/uL (ref 0.0–0.1)
Immature Granulocytes: 0 %
Lymphocytes Absolute: 1.6 10*3/uL (ref 0.7–3.1)
Lymphs: 58 %
MCH: 32.6 pg (ref 26.6–33.0)
MCHC: 34.2 g/dL (ref 31.5–35.7)
MCV: 96 fL (ref 79–97)
Monocytes Absolute: 0.5 10*3/uL (ref 0.1–0.9)
Monocytes: 18 %
Neutrophils Absolute: 0.4 10*3/uL — CL (ref 1.4–7.0)
Neutrophils: 14 %
Platelets: 261 10*3/uL (ref 150–450)
RBC: 4.26 x10E6/uL (ref 3.77–5.28)
RDW: 12.1 % (ref 11.7–15.4)
WBC: 2.9 10*3/uL — ABNORMAL LOW (ref 3.4–10.8)

## 2021-11-23 LAB — TSH: TSH: 0.684 u[IU]/mL (ref 0.450–4.500)

## 2021-11-23 LAB — T4, FREE: Free T4: 0.72 ng/dL — ABNORMAL LOW (ref 0.82–1.77)

## 2021-11-25 ENCOUNTER — Other Ambulatory Visit: Payer: Self-pay | Admitting: Physician Assistant

## 2021-11-25 DIAGNOSIS — E1159 Type 2 diabetes mellitus with other circulatory complications: Secondary | ICD-10-CM

## 2021-11-25 DIAGNOSIS — E119 Type 2 diabetes mellitus without complications: Secondary | ICD-10-CM

## 2021-11-30 ENCOUNTER — Other Ambulatory Visit: Payer: Self-pay | Admitting: Physician Assistant

## 2021-11-30 DIAGNOSIS — R7989 Other specified abnormal findings of blood chemistry: Secondary | ICD-10-CM

## 2021-12-04 ENCOUNTER — Other Ambulatory Visit: Payer: Medicare PPO

## 2021-12-18 ENCOUNTER — Other Ambulatory Visit: Payer: Self-pay | Admitting: Physician Assistant

## 2021-12-18 DIAGNOSIS — K2970 Gastritis, unspecified, without bleeding: Secondary | ICD-10-CM

## 2022-01-09 ENCOUNTER — Other Ambulatory Visit: Payer: Medicare PPO

## 2022-01-24 ENCOUNTER — Other Ambulatory Visit: Payer: Medicare PPO

## 2022-01-24 DIAGNOSIS — E039 Hypothyroidism, unspecified: Secondary | ICD-10-CM

## 2022-01-25 LAB — T3: T3, Total: 73 ng/dL (ref 71–180)

## 2022-01-25 LAB — TSH: TSH: 4.85 u[IU]/mL — ABNORMAL HIGH (ref 0.450–4.500)

## 2022-01-25 LAB — T4, FREE: Free T4: 0.82 ng/dL (ref 0.82–1.77)

## 2022-01-31 DIAGNOSIS — M25511 Pain in right shoulder: Secondary | ICD-10-CM | POA: Diagnosis not present

## 2022-01-31 DIAGNOSIS — M25571 Pain in right ankle and joints of right foot: Secondary | ICD-10-CM | POA: Diagnosis not present

## 2022-01-31 DIAGNOSIS — M25562 Pain in left knee: Secondary | ICD-10-CM | POA: Diagnosis not present

## 2022-02-12 ENCOUNTER — Other Ambulatory Visit: Payer: Self-pay | Admitting: Physician Assistant

## 2022-02-12 DIAGNOSIS — E119 Type 2 diabetes mellitus without complications: Secondary | ICD-10-CM

## 2022-02-12 DIAGNOSIS — E1159 Type 2 diabetes mellitus with other circulatory complications: Secondary | ICD-10-CM

## 2022-02-13 ENCOUNTER — Other Ambulatory Visit: Payer: Self-pay | Admitting: Physician Assistant

## 2022-02-13 DIAGNOSIS — E039 Hypothyroidism, unspecified: Secondary | ICD-10-CM

## 2022-02-13 DIAGNOSIS — E119 Type 2 diabetes mellitus without complications: Secondary | ICD-10-CM

## 2022-02-13 DIAGNOSIS — E1169 Type 2 diabetes mellitus with other specified complication: Secondary | ICD-10-CM

## 2022-03-26 ENCOUNTER — Ambulatory Visit: Payer: Medicare PPO | Admitting: Physician Assistant

## 2022-05-10 DIAGNOSIS — F4001 Agoraphobia with panic disorder: Secondary | ICD-10-CM | POA: Diagnosis not present

## 2022-05-10 DIAGNOSIS — F330 Major depressive disorder, recurrent, mild: Secondary | ICD-10-CM | POA: Diagnosis not present

## 2022-05-10 DIAGNOSIS — F4310 Post-traumatic stress disorder, unspecified: Secondary | ICD-10-CM | POA: Diagnosis not present

## 2022-05-11 ENCOUNTER — Other Ambulatory Visit: Payer: Self-pay | Admitting: Physician Assistant

## 2022-05-11 DIAGNOSIS — E119 Type 2 diabetes mellitus without complications: Secondary | ICD-10-CM

## 2022-05-11 DIAGNOSIS — E1159 Type 2 diabetes mellitus with other circulatory complications: Secondary | ICD-10-CM

## 2022-05-11 NOTE — Telephone Encounter (Signed)
Needs an office visit.

## 2022-05-14 ENCOUNTER — Other Ambulatory Visit: Payer: Self-pay | Admitting: Physician Assistant

## 2022-05-14 DIAGNOSIS — E039 Hypothyroidism, unspecified: Secondary | ICD-10-CM

## 2022-05-14 DIAGNOSIS — E1169 Type 2 diabetes mellitus with other specified complication: Secondary | ICD-10-CM

## 2022-05-14 NOTE — Telephone Encounter (Signed)
Needs an office visit for future refills

## 2022-05-25 ENCOUNTER — Other Ambulatory Visit: Payer: Self-pay | Admitting: Nurse Practitioner

## 2022-05-25 DIAGNOSIS — E119 Type 2 diabetes mellitus without complications: Secondary | ICD-10-CM

## 2022-05-25 DIAGNOSIS — E1159 Type 2 diabetes mellitus with other circulatory complications: Secondary | ICD-10-CM

## 2022-07-29 ENCOUNTER — Other Ambulatory Visit: Payer: Self-pay | Admitting: Nurse Practitioner

## 2022-07-29 DIAGNOSIS — E039 Hypothyroidism, unspecified: Secondary | ICD-10-CM

## 2022-07-29 DIAGNOSIS — E1169 Type 2 diabetes mellitus with other specified complication: Secondary | ICD-10-CM

## 2022-07-30 ENCOUNTER — Other Ambulatory Visit: Payer: Self-pay

## 2022-07-30 DIAGNOSIS — E1169 Type 2 diabetes mellitus with other specified complication: Secondary | ICD-10-CM

## 2022-07-30 DIAGNOSIS — E039 Hypothyroidism, unspecified: Secondary | ICD-10-CM

## 2022-07-30 MED ORDER — EZETIMIBE 10 MG PO TABS: ORAL_TABLET | ORAL | 0 refills | Status: AC

## 2022-07-30 MED ORDER — LEVOTHYROXINE SODIUM 125 MCG PO TABS: ORAL_TABLET | ORAL | 0 refills | Status: AC

## 2022-07-30 MED ORDER — SIMVASTATIN 40 MG PO TABS: ORAL_TABLET | ORAL | 0 refills | Status: AC

## 2022-07-30 NOTE — Telephone Encounter (Signed)
L.O.V: 11/22/21  N.O.V: not scheduled    L.R.F: 05/14/22 Levothyroxine 30 tab 0 refill      Simvastatin 30 tab 0 refill      Ezetimibe 30 tab 0 refill    Refills denied. Appointment required.

## 2022-08-22 ENCOUNTER — Telehealth: Payer: Self-pay

## 2022-08-22 NOTE — Telephone Encounter (Signed)
LVM for  return call for pt to schedule AWV.

## 2023-01-24 ENCOUNTER — Telehealth: Payer: Self-pay

## 2023-01-24 NOTE — Telephone Encounter (Signed)
LVM for patient to call back [phone], or to call PCP office to schedule follow up apt. AS, CMA
